# Patient Record
Sex: Female | Born: 1950
Health system: Southern US, Community
[De-identification: ages and names within clinical notes are randomized; demographics above are authoritative.]

## PROBLEM LIST (undated history)

## (undated) DIAGNOSIS — H109 Unspecified conjunctivitis: Secondary | ICD-10-CM

## (undated) DIAGNOSIS — N189 Chronic kidney disease, unspecified: Secondary | ICD-10-CM

## (undated) DIAGNOSIS — K219 Gastro-esophageal reflux disease without esophagitis: Secondary | ICD-10-CM

## (undated) DIAGNOSIS — E039 Hypothyroidism, unspecified: Secondary | ICD-10-CM

## (undated) DIAGNOSIS — H9192 Unspecified hearing loss, left ear: Secondary | ICD-10-CM

## (undated) DIAGNOSIS — T7840XA Allergy, unspecified, initial encounter: Secondary | ICD-10-CM

## (undated) DIAGNOSIS — E785 Hyperlipidemia, unspecified: Secondary | ICD-10-CM

## (undated) DIAGNOSIS — I10 Essential (primary) hypertension: Secondary | ICD-10-CM

## (undated) DIAGNOSIS — Z87442 Personal history of urinary calculi: Secondary | ICD-10-CM

## (undated) DIAGNOSIS — K7581 Nonalcoholic steatohepatitis (NASH): Secondary | ICD-10-CM

## (undated) DIAGNOSIS — K760 Fatty (change of) liver, not elsewhere classified: Secondary | ICD-10-CM

## (undated) DIAGNOSIS — N951 Menopausal and female climacteric states: Secondary | ICD-10-CM

## (undated) DIAGNOSIS — C801 Malignant (primary) neoplasm, unspecified: Secondary | ICD-10-CM

## (undated) HISTORY — DX: Nonalcoholic steatohepatitis (NASH): K75.81

## (undated) HISTORY — DX: Gastro-esophageal reflux disease without esophagitis: K21.9

## (undated) HISTORY — DX: Malignant (primary) neoplasm, unspecified: C80.1

## (undated) HISTORY — DX: Chronic kidney disease, unspecified: N18.9

## (undated) HISTORY — DX: Allergy, unspecified, initial encounter: T78.40XA

## (undated) HISTORY — PX: COLONOSCOPY: SHX174

## (undated) HISTORY — PX: TONSILLECTOMY: SUR1361

## (undated) HISTORY — DX: Menopausal and female climacteric states: N95.1

## (undated) HISTORY — PX: ESOPHAGOGASTRODUODENOSCOPY: SHX1529

## (undated) HISTORY — DX: Unspecified hearing loss, left ear: H91.92

## (undated) HISTORY — PX: ABDOMINAL HYSTERECTOMY: SHX81

## (undated) HISTORY — PX: UPPER GASTROINTESTINAL ENDOSCOPY: SHX188

## (undated) HISTORY — DX: Hypothyroidism, unspecified: E03.9

## (undated) HISTORY — DX: Hyperlipidemia, unspecified: E78.5

## (undated) HISTORY — DX: Essential (primary) hypertension: I10

## (undated) HISTORY — DX: Unspecified conjunctivitis: H10.9

## (undated) HISTORY — PX: CHOLECYSTECTOMY: SHX55

---

## 1998-04-09 ENCOUNTER — Other Ambulatory Visit: Admission: RE | Admit: 1998-04-09 | Discharge: 1998-04-09 | Payer: Self-pay | Admitting: *Deleted

## 1999-04-08 ENCOUNTER — Other Ambulatory Visit: Admission: RE | Admit: 1999-04-08 | Discharge: 1999-04-08 | Payer: Self-pay | Admitting: *Deleted

## 2000-05-04 ENCOUNTER — Other Ambulatory Visit: Admission: RE | Admit: 2000-05-04 | Discharge: 2000-05-04 | Payer: Self-pay | Admitting: *Deleted

## 2004-05-04 ENCOUNTER — Ambulatory Visit: Payer: Self-pay | Admitting: Hematology & Oncology

## 2004-08-13 ENCOUNTER — Ambulatory Visit: Payer: Self-pay | Admitting: Hematology & Oncology

## 2004-08-19 ENCOUNTER — Ambulatory Visit: Payer: Self-pay | Admitting: Family Medicine

## 2004-10-01 ENCOUNTER — Ambulatory Visit: Payer: Self-pay | Admitting: Family Medicine

## 2004-10-13 ENCOUNTER — Other Ambulatory Visit: Admission: RE | Admit: 2004-10-13 | Discharge: 2004-10-13 | Payer: Self-pay | Admitting: Family Medicine

## 2004-10-13 ENCOUNTER — Ambulatory Visit: Payer: Self-pay | Admitting: Family Medicine

## 2004-10-13 LAB — CONVERTED CEMR LAB: Pap Smear: NORMAL

## 2004-11-03 ENCOUNTER — Ambulatory Visit: Payer: Self-pay | Admitting: Family Medicine

## 2004-11-22 ENCOUNTER — Ambulatory Visit: Payer: Self-pay | Admitting: Family Medicine

## 2004-12-24 ENCOUNTER — Encounter (INDEPENDENT_AMBULATORY_CARE_PROVIDER_SITE_OTHER): Payer: Self-pay | Admitting: *Deleted

## 2004-12-24 ENCOUNTER — Ambulatory Visit (HOSPITAL_COMMUNITY): Admission: RE | Admit: 2004-12-24 | Discharge: 2004-12-24 | Payer: Self-pay | Admitting: Surgery

## 2005-03-03 ENCOUNTER — Ambulatory Visit: Payer: Self-pay | Admitting: Family Medicine

## 2005-03-17 ENCOUNTER — Ambulatory Visit: Payer: Self-pay | Admitting: Otolaryngology

## 2005-03-28 ENCOUNTER — Ambulatory Visit: Payer: Self-pay | Admitting: Family Medicine

## 2005-03-31 ENCOUNTER — Ambulatory Visit: Payer: Self-pay | Admitting: Family Medicine

## 2005-10-17 ENCOUNTER — Ambulatory Visit: Payer: Self-pay | Admitting: Family Medicine

## 2005-10-17 LAB — CONVERTED CEMR LAB: TSH: 0.51 microintl units/mL

## 2005-11-21 ENCOUNTER — Ambulatory Visit: Payer: Self-pay | Admitting: Family Medicine

## 2005-12-08 ENCOUNTER — Ambulatory Visit: Payer: Self-pay | Admitting: Family Medicine

## 2006-02-09 ENCOUNTER — Ambulatory Visit: Payer: Self-pay | Admitting: Family Medicine

## 2006-03-06 LAB — HM COLONOSCOPY: HM Colonoscopy: NORMAL

## 2006-04-18 ENCOUNTER — Ambulatory Visit: Payer: Self-pay | Admitting: Family Medicine

## 2006-10-11 ENCOUNTER — Encounter: Payer: Self-pay | Admitting: Family Medicine

## 2006-10-11 DIAGNOSIS — E039 Hypothyroidism, unspecified: Secondary | ICD-10-CM | POA: Insufficient documentation

## 2006-10-11 DIAGNOSIS — I10 Essential (primary) hypertension: Secondary | ICD-10-CM | POA: Insufficient documentation

## 2006-10-11 DIAGNOSIS — H409 Unspecified glaucoma: Secondary | ICD-10-CM | POA: Insufficient documentation

## 2006-10-11 DIAGNOSIS — Z87442 Personal history of urinary calculi: Secondary | ICD-10-CM | POA: Insufficient documentation

## 2006-10-11 DIAGNOSIS — K219 Gastro-esophageal reflux disease without esophagitis: Secondary | ICD-10-CM | POA: Insufficient documentation

## 2006-10-23 ENCOUNTER — Ambulatory Visit: Payer: Self-pay | Admitting: Family Medicine

## 2006-10-23 DIAGNOSIS — N951 Menopausal and female climacteric states: Secondary | ICD-10-CM | POA: Insufficient documentation

## 2006-10-25 ENCOUNTER — Encounter (INDEPENDENT_AMBULATORY_CARE_PROVIDER_SITE_OTHER): Payer: Self-pay | Admitting: *Deleted

## 2006-10-25 LAB — CONVERTED CEMR LAB
AST: 62 units/L — ABNORMAL HIGH (ref 0–37)
Alkaline Phosphatase: 76 units/L (ref 39–117)
BUN: 11 mg/dL (ref 6–23)
Basophils Absolute: 0 10*3/uL (ref 0.0–0.1)
Basophils Relative: 0.1 % (ref 0.0–1.0)
Bilirubin, Direct: 0.2 mg/dL (ref 0.0–0.3)
Calcium: 9.6 mg/dL (ref 8.4–10.5)
Chloride: 103 meq/L (ref 96–112)
Direct LDL: 139.7 mg/dL
Glucose, Bld: 98 mg/dL (ref 70–99)
Hemoglobin: 16.5 g/dL — ABNORMAL HIGH (ref 12.0–15.0)
Lymphocytes Relative: 32.3 % (ref 12.0–46.0)
MCV: 98.9 fL (ref 78.0–100.0)
Monocytes Absolute: 0.5 10*3/uL (ref 0.2–0.7)
Monocytes Relative: 7.4 % (ref 3.0–11.0)
Neutro Abs: 4 10*3/uL (ref 1.4–7.7)
Platelets: 137 10*3/uL — ABNORMAL LOW (ref 150–400)
Potassium: 3.7 meq/L (ref 3.5–5.1)
RBC: 4.79 M/uL (ref 3.87–5.11)
TSH: 0.05 microintl units/mL — ABNORMAL LOW (ref 0.35–5.50)
Total Bilirubin: 1.3 mg/dL — ABNORMAL HIGH (ref 0.3–1.2)
Total CHOL/HDL Ratio: 4.4
Total Protein: 7.1 g/dL (ref 6.0–8.3)
Triglycerides: 134 mg/dL (ref 0–149)

## 2007-01-26 ENCOUNTER — Ambulatory Visit: Payer: Self-pay | Admitting: Family Medicine

## 2007-01-29 LAB — CONVERTED CEMR LAB
AST: 47 units/L — ABNORMAL HIGH (ref 0–37)
Basophils Absolute: 0 10*3/uL (ref 0.0–0.1)
Basophils Relative: 0.3 % (ref 0.0–1.0)
Calcium: 9.5 mg/dL (ref 8.4–10.5)
Cholesterol: 188 mg/dL (ref 0–200)
Creatinine, Ser: 0.8 mg/dL (ref 0.4–1.2)
Eosinophils Absolute: 0.4 10*3/uL (ref 0.0–0.6)
Eosinophils Relative: 5.7 % — ABNORMAL HIGH (ref 0.0–5.0)
GFR calc non Af Amer: 78 mL/min
HCT: 43.6 % (ref 36.0–46.0)
HDL: 39.6 mg/dL (ref >39.0)
Lymphocytes Relative: 37.6 % (ref 12.0–46.0)
MCHC: 35.1 g/dL (ref 30.0–36.0)
MCV: 99.9 fL (ref 78.0–100.0)
Monocytes Absolute: 0.4 10*3/uL (ref 0.2–0.7)
Neutro Abs: 3.3 10*3/uL (ref 1.4–7.7)
Platelets: 157 10*3/uL (ref 150–400)
Potassium: 3.3 meq/L — ABNORMAL LOW (ref 3.5–5.1)
TSH: 0.14 microintl units/mL — ABNORMAL LOW (ref 0.35–5.50)
Total CHOL/HDL Ratio: 4.7

## 2007-03-16 ENCOUNTER — Ambulatory Visit: Payer: Self-pay | Admitting: Family Medicine

## 2007-07-09 ENCOUNTER — Encounter: Payer: Self-pay | Admitting: Family Medicine

## 2007-07-13 ENCOUNTER — Encounter (INDEPENDENT_AMBULATORY_CARE_PROVIDER_SITE_OTHER): Payer: Self-pay | Admitting: *Deleted

## 2007-07-13 ENCOUNTER — Encounter: Payer: Self-pay | Admitting: Family Medicine

## 2007-08-28 ENCOUNTER — Telehealth: Payer: Self-pay | Admitting: Family Medicine

## 2007-10-24 ENCOUNTER — Encounter: Payer: Self-pay | Admitting: Family Medicine

## 2007-10-24 ENCOUNTER — Other Ambulatory Visit: Admission: RE | Admit: 2007-10-24 | Discharge: 2007-10-24 | Payer: Self-pay | Admitting: Family Medicine

## 2007-10-24 ENCOUNTER — Ambulatory Visit: Payer: Self-pay | Admitting: Family Medicine

## 2007-10-25 ENCOUNTER — Encounter (INDEPENDENT_AMBULATORY_CARE_PROVIDER_SITE_OTHER): Payer: Self-pay | Admitting: *Deleted

## 2007-10-25 ENCOUNTER — Telehealth: Payer: Self-pay | Admitting: Family Medicine

## 2007-10-26 ENCOUNTER — Encounter (INDEPENDENT_AMBULATORY_CARE_PROVIDER_SITE_OTHER): Payer: Self-pay | Admitting: *Deleted

## 2007-10-26 LAB — CONVERTED CEMR LAB
ALT: 44 units/L — ABNORMAL HIGH (ref 0–35)
Albumin: 4.2 g/dL (ref 3.5–5.2)
Alkaline Phosphatase: 79 units/L (ref 39–117)
BUN: 9 mg/dL (ref 6–23)
Basophils Absolute: 0 10*3/uL (ref 0.0–0.1)
Basophils Relative: 0.3 % (ref 0.0–1.0)
Bilirubin, Direct: 0.2 mg/dL (ref 0.0–0.3)
Calcium: 9.6 mg/dL (ref 8.4–10.5)
Creatinine, Ser: 0.9 mg/dL (ref 0.4–1.2)
GFR calc non Af Amer: 68 mL/min
Hemoglobin: 15.8 g/dL — ABNORMAL HIGH (ref 12.0–15.0)
LDL Cholesterol: 132 mg/dL — ABNORMAL HIGH (ref 0–99)
Lymphocytes Relative: 34.4 % (ref 12.0–46.0)
MCHC: 35.6 g/dL (ref 30.0–36.0)
MCV: 95.1 fL (ref 78.0–100.0)
Monocytes Absolute: 0.4 10*3/uL (ref 0.1–1.0)
Platelets: 132 10*3/uL — ABNORMAL LOW (ref 150–400)
RDW: 11.9 % (ref 11.5–14.6)
Sodium: 140 meq/L (ref 135–145)
TSH: 0.08 microintl units/mL — ABNORMAL LOW (ref 0.35–5.50)
Total CHOL/HDL Ratio: 4.6
VLDL: 20 mg/dL (ref 0–40)
WBC: 6.3 10*3/uL (ref 4.5–10.5)

## 2007-11-01 ENCOUNTER — Encounter (INDEPENDENT_AMBULATORY_CARE_PROVIDER_SITE_OTHER): Payer: Self-pay | Admitting: *Deleted

## 2007-11-01 LAB — CONVERTED CEMR LAB: Pap Smear: NORMAL

## 2007-12-14 ENCOUNTER — Ambulatory Visit: Payer: Self-pay | Admitting: Family Medicine

## 2007-12-18 ENCOUNTER — Encounter: Payer: Self-pay | Admitting: Family Medicine

## 2007-12-18 LAB — CONVERTED CEMR LAB
Basophils Relative: 0.8 % (ref 0.0–1.0)
Eosinophils Relative: 5.8 % — ABNORMAL HIGH (ref 0.0–5.0)
Free T4: 1.1 ng/dL (ref 0.6–1.6)
MCV: 98.3 fL (ref 78.0–100.0)
Monocytes Relative: 6.4 % (ref 3.0–12.0)
Neutrophils Relative %: 53.1 % (ref 43.0–77.0)
Platelets: 122 10*3/uL — ABNORMAL LOW (ref 150–400)
RBC: 4.57 M/uL (ref 3.87–5.11)

## 2008-03-10 ENCOUNTER — Ambulatory Visit: Payer: Self-pay | Admitting: Family Medicine

## 2008-03-10 ENCOUNTER — Telehealth: Payer: Self-pay | Admitting: Family Medicine

## 2008-03-12 LAB — CONVERTED CEMR LAB
Albumin: 4.1 g/dL (ref 3.5–5.2)
BUN: 9 mg/dL (ref 6–23)
Basophils Absolute: 0 10*3/uL (ref 0.0–0.1)
Calcium: 9.4 mg/dL (ref 8.4–10.5)
Chloride: 104 meq/L (ref 96–112)
Cholesterol: 215 mg/dL (ref 0–200)
Direct LDL: 150.1 mg/dL
Eosinophils Relative: 6 % — ABNORMAL HIGH (ref 0.0–5.0)
Free T4: 0.9 ng/dL (ref 0.6–1.6)
GFR calc Af Amer: 94 mL/min
Glucose, Bld: 93 mg/dL (ref 70–99)
Hemoglobin: 15.4 g/dL — ABNORMAL HIGH (ref 12.0–15.0)
Lymphocytes Relative: 34.5 % (ref 12.0–46.0)
MCHC: 35.7 g/dL (ref 30.0–36.0)
Monocytes Absolute: 0.4 10*3/uL (ref 0.1–1.0)
Monocytes Relative: 6 % (ref 3.0–12.0)
Platelets: 138 10*3/uL — ABNORMAL LOW (ref 150–400)
Potassium: 3.8 meq/L (ref 3.5–5.1)
RDW: 12.1 % (ref 11.5–14.6)
Sodium: 141 meq/L (ref 135–145)
Triglycerides: 127 mg/dL (ref 0–149)
VLDL: 25 mg/dL (ref 0–40)

## 2008-03-18 ENCOUNTER — Ambulatory Visit: Payer: Self-pay | Admitting: Family Medicine

## 2008-03-18 DIAGNOSIS — R740 Nonspecific elevation of levels of transaminase and lactic acid dehydrogenase [LDH]: Secondary | ICD-10-CM

## 2008-03-18 DIAGNOSIS — E785 Hyperlipidemia, unspecified: Secondary | ICD-10-CM | POA: Insufficient documentation

## 2008-03-18 DIAGNOSIS — R7402 Elevation of levels of lactic acid dehydrogenase (LDH): Secondary | ICD-10-CM | POA: Insufficient documentation

## 2008-03-18 DIAGNOSIS — E669 Obesity, unspecified: Secondary | ICD-10-CM | POA: Insufficient documentation

## 2008-03-18 DIAGNOSIS — R7401 Elevation of levels of liver transaminase levels: Secondary | ICD-10-CM | POA: Insufficient documentation

## 2008-04-08 ENCOUNTER — Ambulatory Visit: Payer: Self-pay | Admitting: Family Medicine

## 2008-07-16 ENCOUNTER — Encounter: Payer: Self-pay | Admitting: Family Medicine

## 2008-07-21 ENCOUNTER — Encounter (INDEPENDENT_AMBULATORY_CARE_PROVIDER_SITE_OTHER): Payer: Self-pay | Admitting: *Deleted

## 2008-10-30 ENCOUNTER — Ambulatory Visit: Payer: Self-pay | Admitting: Family Medicine

## 2008-10-30 LAB — CONVERTED CEMR LAB
Cholesterol, target level: 200 mg/dL
LDL Goal: 130 mg/dL

## 2008-10-31 LAB — CONVERTED CEMR LAB
AST: 90 units/L — ABNORMAL HIGH (ref 0–37)
Albumin: 4.2 g/dL (ref 3.5–5.2)
Bilirubin, Direct: 0.3 mg/dL (ref 0.0–0.3)
Cholesterol: 218 mg/dL — ABNORMAL HIGH (ref 0–200)
HDL: 48.9 mg/dL (ref >39.00)
Total Bilirubin: 1.6 mg/dL — ABNORMAL HIGH (ref 0.3–1.2)
VLDL: 17.8 mg/dL (ref 0.0–40.0)

## 2009-03-18 ENCOUNTER — Ambulatory Visit: Payer: Self-pay | Admitting: Family Medicine

## 2009-08-25 ENCOUNTER — Encounter: Payer: Self-pay | Admitting: Family Medicine

## 2009-08-31 ENCOUNTER — Encounter (INDEPENDENT_AMBULATORY_CARE_PROVIDER_SITE_OTHER): Payer: Self-pay | Admitting: *Deleted

## 2009-11-03 ENCOUNTER — Telehealth: Payer: Self-pay | Admitting: Family Medicine

## 2009-11-03 ENCOUNTER — Ambulatory Visit: Payer: Self-pay | Admitting: Family Medicine

## 2009-11-03 LAB — CONVERTED CEMR LAB
ALT: 49 units/L — ABNORMAL HIGH (ref 0–35)
Alkaline Phosphatase: 70 units/L (ref 39–117)
BUN: 10 mg/dL (ref 6–23)
Basophils Absolute: 0 10*3/uL (ref 0.0–0.1)
Bilirubin, Direct: 0.2 mg/dL (ref 0.0–0.3)
CO2: 34 meq/L — ABNORMAL HIGH (ref 19–32)
Calcium: 9.8 mg/dL (ref 8.4–10.5)
Cholesterol: 248 mg/dL — ABNORMAL HIGH (ref 0–200)
Creatinine, Ser: 0.9 mg/dL (ref 0.4–1.2)
Eosinophils Relative: 6.4 % — ABNORMAL HIGH (ref 0.0–5.0)
Hemoglobin: 15.4 g/dL — ABNORMAL HIGH (ref 12.0–15.0)
Lymphocytes Relative: 31.8 % (ref 12.0–46.0)
MCV: 101.7 fL — ABNORMAL HIGH (ref 78.0–100.0)
Monocytes Absolute: 0.4 10*3/uL (ref 0.1–1.0)
Neutrophils Relative %: 55.9 % (ref 43.0–77.0)
Phosphorus: 4 mg/dL (ref 2.3–4.6)
Potassium: 4.4 meq/L (ref 3.5–5.1)
RDW: 13.2 % (ref 11.5–14.6)
Triglycerides: 158 mg/dL — ABNORMAL HIGH (ref 0.0–149.0)
WBC: 6.9 10*3/uL (ref 4.5–10.5)

## 2010-03-31 ENCOUNTER — Ambulatory Visit: Payer: Self-pay | Admitting: Family Medicine

## 2010-07-06 NOTE — Assessment & Plan Note (Signed)
Summary: FLU SHOT/TOWER/CLE  Nurse Visit   Allergies: 1)  ! Codeine 2)  * Ivp Dye  Orders Added: 1)  Admin 1st Vaccine [90471] 2)  Flu Vaccine 53yr + [[44920]        Flu Vaccine Consent Questions     Do you have a history of severe allergic reactions to this vaccine? no    Any prior history of allergic reactions to egg and/or gelatin? no    Do you have a sensitivity to the preservative Thimersol? no    Do you have a past history of Guillan-Barre Syndrome? no    Do you currently have an acute febrile illness? no    Have you ever had a severe reaction to latex? no    Vaccine information given and explained to patient? yes    Are you currently pregnant? no    Lot Number:[id number]   Exp Date:12/04/2010   Site Given  Left Deltoid IM ROzzie HoyleLPN  October 26, 2100710:50 AM

## 2010-07-06 NOTE — Progress Notes (Signed)
Summary: change to maxzide tabs  Phone Note From Pharmacy   Caller: [place] Summary of Call: Selena Johnson is asking if ok to change from dyazide capsules to tabs.  Capsules are on back order.               Marty Heck CMA  Nov 03, 2009 3:14 PM   Follow-up for Phone Call        that is fine if ok with pt  Follow-up by: Allena Earing MD,  Nov 03, 2009 4:23 PM  Additional Follow-up for Phone Call Additional follow up Details #1::        Pharmacy advised. Additional Follow-up by: Christena Deem CMA Deborra Medina),  Nov 03, 2009 5:01 PM

## 2010-07-06 NOTE — Letter (Signed)
Summary: Results Follow up Letter  [place] [facility]  [address] [place], [place] [address]   Phone: [phone]  Fax: [phone]    08/31/2009 MRN: [id number]     Selena Johnson [address] [place], [place]  [address]    Dear Selena Johnson,  The following are the results of your recent test(s):  Test         Result    Pap Smear:        Normal _____  Not Normal _____ Comments: ______________________________________________________ Cholesterol: LDL(Bad cholesterol):         Your goal is less than:         HDL (Good cholesterol):       Your goal is more than: Comments:  ______________________________________________________ Mammogram:        Normal ___x__  Not Normal _____ Comments:Repeat in 1 year  ___________________________________________________________________ Hemoccult:        Normal _____  Not normal _______ Comments:    _____________________________________________________________________ Other Tests:    We routinely do not discuss normal results over the telephone.  If you desire a copy of the results, or you have any questions about this information we can discuss them at your next office visit.   Sincerely,   Loura Pardon MD

## 2010-07-06 NOTE — Assessment & Plan Note (Signed)
Summary: CPX/CLE   Vital Signs:  Patient profile:   60 year old female Height:      65.5 inches Weight:      224.75 pounds BMI:     36.96 Temp:     97.9 degrees F oral Pulse rate:   64 / minute Pulse rhythm:   regular BP sitting:   118 / 70  (left arm) Cuff size:   large  Vitals Entered By: Ozzie Hoyle LPN (Nov 03, 4625 03:50 AM) CC: CPX LMP part hyst 1985   History of Present Illness: here for health mt exam and to rev chronic med problems   is doing just fine - nothing new   wt is up 3 lb   bp 118/70  bmi 36  hypothyroid - a little more hair loss lately - otherwise feels the same   is walking for exercise    hyst partial past nl pap 09  no hx abn pap or ca no symptoms   mam 3/11  self exam - no lumps   nl dexa 03 no family hx of OP  ca and D  TD 08  shingles status-- wants vaccine- did check with her insurance   due for labs   Allergies: 1)  ! Codeine 2)  * Ivp Dye  Past History:  Past Medical History: Last updated: 10/30/2008 GERD Hypertension Hypothyroidism hyperlipidemia  menopausal syndrome  elevated transaminases  chronic conjunctivitis glaucoma   opthy  Past Surgical History: Last updated: 10/11/2006 Cholecystectomy Hysterectomy- partial, ? prolapse Tonsillectomy EGD Colonoscopy- internal hemorrhoids (03/2006)  Family History: Last updated: 10/24/2007 Father: CAD, HTN, DM, ?ca family- lots of HTN sister died of COPD - smoker    Mother:CAD, HTN, thyroid probs, glaucoma  Social History: Last updated: 10/24/2007 Marital Status: Married Children: none Occupation:  Former Smoker quit after college   Risk Factors: Smoking Status: quit (10/11/2006)  Review of Systems General:  Denies fatigue, malaise, and sweats. Eyes:  Denies blurring and eye irritation. CV:  Denies chest pain or discomfort, lightheadness, and palpitations. Resp:  Denies cough, shortness of breath, and wheezing. GI:  Denies abdominal pain,  change in bowel habits, indigestion, nausea, and vomiting. GU:  Denies abnormal vaginal bleeding, discharge, dysuria, and urinary frequency. MS:  Denies joint redness, joint swelling, and cramps. Derm:  Complains of hair loss; denies itching, lesion(s), poor wound healing, and rash. Neuro:  Denies numbness and tingling. Psych:  Denies anxiety and depression. Endo:  Denies cold intolerance and heat intolerance. Heme:  Denies abnormal bruising and bleeding.  Physical Exam  General:  obese and well appearing  Head:  normocephalic, atraumatic, and no abnormalities observed.   Eyes:  vision grossly intact, pupils equal, pupils round, and pupils reactive to light.  no conjunctival pallor, injection or icterus  Mouth:  pharynx pink and moist.   Neck:  supple with full rom and no masses or thyromegally, no JVD or carotid bruit  Chest Wall:  No deformities, masses, or tenderness noted. Breasts:  No mass, nodules, thickening, tenderness, bulging, retraction, inflamation, nipple discharge or skin changes noted.   Lungs:  Normal respiratory effort, chest expands symmetrically. Lungs are clear to auscultation, no crackles or wheezes. Heart:  Normal rate and regular rhythm. S1 and S2 normal without gallop, murmur, click, rub or other extra sounds. Abdomen:  Bowel sounds positive,abdomen soft and non-tender without masses, organomegaly or hernias noted. no renal bruits  Msk:  No deformity or scoliosis noted of thoracic or lumbar spine.  no acute  joint changes  Pulses:  R and L carotid,radial,femoral,dorsalis pedis and posterior tibial pulses are full and equal bilaterally Extremities:  No clubbing, cyanosis, edema, or deformity noted with normal full range of motion of all joints.   Neurologic:  sensation intact to light touch, gait normal, and DTRs symmetrical and normal.   Skin:  Intact without suspicious lesions or rashes Cervical Nodes:  No lymphadenopathy noted Inguinal Nodes:  No significant  adenopathy Psych:  normal affect, talkative and pleasant    Impression & Recommendations:  Problem # 1:  HEALTH MAINTENANCE EXAM (ICD-V70.0) Assessment Comment Only  reviewed health habits including diet, exercise and skin cancer prevention reviewed health maintenance list and family history  labs today wt loss adv  zostavax today  Orders: Prescription Created Electronically [phone])  Problem # 2:  TRANSAMINASES, SERUM, ELEVATED (ICD-790.4) Assessment: Unchanged will continue to watch- suspect fatty liver wt loss adv  Orders: Venipuncture (75916) TLB-Lipid Panel (80061-LIPID) TLB-Renal Function Panel (80069-RENAL) TLB-Hepatic/Liver Function Pnl (80076-HEPATIC) TLB-CBC Platelet - w/Differential (85025-CBCD) TLB-TSH (Thyroid Stimulating Hormone) (84443-TSH) Prescription Created Electronically [phone])  Problem # 3:  HYPERLIPIDEMIA (ICD-272.4) Assessment: Unchanged lab today rev low sat fat diet  Orders: Venipuncture (59935) TLB-Lipid Panel (80061-LIPID) TLB-Renal Function Panel (80069-RENAL) TLB-Hepatic/Liver Function Pnl (80076-HEPATIC) TLB-CBC Platelet - w/Differential (85025-CBCD) TLB-TSH (Thyroid Stimulating Hormone) (70177-LTJ) Prescription Created Electronically [phone])  Labs Reviewed: SGOT: 90 (10/30/2008)   SGPT: 87 (10/30/2008)  Lipid Goals: Chol Goal: 200 (10/30/2008)   HDL Goal: 40 (10/30/2008)   LDL Goal: 130 (10/30/2008)   TG Goal: 150 (10/30/2008)  Prior 10 Yr Risk Heart Disease: Not enough information (10/30/2008)   HDL:48.90 (10/30/2008), 44.2 (03/10/2008)  LDL:DEL (03/10/2008), 132 (10/24/2007)  Chol:218 (10/30/2008), 215 (03/10/2008)  Trig:89.0 (10/30/2008), 127 (03/10/2008)  Problem # 4:  HYPOTHYROIDISM (ICD-244.9) Assessment: Unchanged  check tsh  no clinical change except for inc hair loss update after lab Her updated medication list for this problem includes:    Synthroid 100 Mcg Tabs (Levothyroxine sodium) .Marland Kitchen... Take one by mouth  daily  Orders: Venipuncture (23300) TLB-Lipid Panel (80061-LIPID) TLB-Renal Function Panel (80069-RENAL) TLB-Hepatic/Liver Function Pnl (80076-HEPATIC) TLB-CBC Platelet - w/Differential (85025-CBCD) TLB-TSH (Thyroid Stimulating Hormone) (76226-JFH) Prescription Created Electronically [id number])  Labs Reviewed: TSH: 3.55 (03/10/2008)    Chol: 218 (10/30/2008)   HDL: 48.90 (10/30/2008)   LDL: DEL (03/10/2008)   TG: 89.0 (10/30/2008)  Problem # 5:  HYPERTENSION (ICD-401.9) Assessment: Unchanged  good control with current meds  urged to keep up walking  lab today Her updated medication list for this problem includes:    Dyazide 37.5-25 Mg Caps (Triamterene-hctz) .Marland Kitchen... Take one by mouth daly    Toprol Xl 100 Mg Tb24 (Metoprolol succinate) .Marland Kitchen... Take one by mouth daily  Orders: Venipuncture (56389) TLB-Lipid Panel (80061-LIPID) TLB-Renal Function Panel (80069-RENAL) TLB-Hepatic/Liver Function Pnl (80076-HEPATIC) TLB-CBC Platelet - w/Differential (85025-CBCD) TLB-TSH (Thyroid Stimulating Hormone) (37342-AJG) Prescription Created Electronically [phone])  BP today: 118/70 Prior BP: 126/70 (10/30/2008)  Prior 10 Yr Risk Heart Disease: Not enough information (10/30/2008)  Labs Reviewed: K+: 3.8 (03/10/2008) Creat: : 0.8 (03/10/2008)   Chol: 218 (10/30/2008)   HDL: 48.90 (10/30/2008)   LDL: DEL (03/10/2008)   TG: 89.0 (10/30/2008)  Complete Medication List: 1)  Protonix 40 Mg Tbec (Pantoprazole sodium) .... Take one by mouth daily 2)  Dyazide 37.5-25 Mg Caps (Triamterene-hctz) .... Take one by mouth daly 3)  Toprol Xl 100 Mg Tb24 (Metoprolol succinate) .... Take one by mouth daily 4)  Cosopt Soln (Dorzolamide-timolol soln) .... Use eye drops as directed 5)  Lumigan Soln (Bimatoprost soln) .... Use eye drops as directed 6)  Caltrate Plus D  .... 1 by mouth two times a day 7)  Synthroid 100 Mcg Tabs (Levothyroxine sodium) .... Take one by mouth daily 8)  Biotin 2500 Mg  ....  Daily 9)  Kls Allerclear 10 Mg Tabs (Loratadine) .... One by mouth daily as needed  Other Orders: Zoster (Shingles) Vaccine Live [phone]) Admin 1st Vaccine [phone]) Admin 1st Vaccine [facility]) [phone])  Patient Instructions: 1)  shingles vaccine today  2)  labs today  3)  keep working on healthy diet and exercise  4)  good blood pressure  Prescriptions: SYNTHROID 100 MCG  TABS (LEVOTHYROXINE SODIUM) Take one by mouth daily  #90 x 3   Entered and Authorized by:   Allena Earing MD   Signed by:   Allena Earing MD on 11/03/2009   Method used:   Electronically to        [facility] (retail)       [place], [place]  [address]       Ph: [phone]       Fax: [phone]   RxID:   [id number] TOPROL XL 100 MG  TB24 (METOPROLOL SUCCINATE) take one by mouth daily  #90 x 3   Entered and Authorized by:   Allena Earing MD   Signed by:   Allena Earing MD on 11/03/2009   Method used:   Electronically to        [facility] (retail)       [place], [place], [place]  [address]       Ph: [phone]       Fax: [phone]   RxID:   [id number] DYAZIDE 37.5-25 MG  CAPS (TRIAMTERENE-HCTZ) take one by mouth daly  #90 x 3   Entered and Authorized by:   Allena Earing MD   Signed by:   Allena Earing MD on 11/03/2009   Method used:   Electronically to        [facility] (retail)       [place], [place]  [address]       Ph: [phone]       Fax: [phone]   RxID:   [id number] PROTONIX 40 MG TBEC (PANTOPRAZOLE SODIUM) Take one by mouth daily  #90 x 3   Entered and Authorized by:   Allena Earing MD   Signed by:   Allena Earing MD on 11/03/2009   Method used:   Electronically to        [facility] (retail)       [place], [place], [place]  [address]       Ph: [phone]       Fax: [phone]   RxID:   [id number]   Current Allergies (reviewed today): ! CODEINE * IVP DYE   Preventive Care Screening  Mammogram:    Date:  08/25/2009    Results:  normal     Zostavax # 1  Vaccine Type: Zostavax    Site: left deltoid    Mfr: Merck    Dose: 0.5 ml    Route: [place]    Given by: Ozzie Hoyle LPN    Exp. Date: 11/13/2010    Lot #: [id number]    VIS given: 03/18/05 given Nov 03, 2009.

## 2010-07-09 ENCOUNTER — Telehealth: Payer: Self-pay | Admitting: Family Medicine

## 2010-07-22 NOTE — Progress Notes (Signed)
Summary: out of synthroid x 2 weeks  Phone Note Call from Patient Call back at Home Phone [phone]   Caller: Patient Call For: Allena Earing MD Summary of Call: Patient was seen for cpx in may of 2011 her synthroid dose was changed at that time. She was supposed to come back in after 6 weeks for f/u and recheck labs after taking her new dose. She is calling today for a refill because she says that she hasn't taken it in over 2 weeks. she doesn't want to set up appt becuase she know that the labs are not going to be accurate. She is asking if she can get a refill and come in after taking it for another 6 weeks. Is it okay to fill it? Uses [facility] [address].  Initial call taken by: Lacretia Nicks,  July 09, 2010 1:32 PM  Follow-up for Phone Call        go ahead and fill- px written on EMR for call in schedule lab in 6 weeks tsh and free T4 for 244.9 thanks Follow-up by: Allena Earing MD,  July 09, 2010 1:37 PM  Additional Follow-up for Phone Call Additional follow up Details #1::        Med sent electronically to [facility] as instructed.Left message for patient to call back. Ozzie Hoyle LPN  July 10, 2955 2:58 PM   Patient notified as instructed by telephone. lab appointment scheduled as instructed 08/23/10 at [facility]  July 12, 4732 10:09 AM     Prescriptions: SYNTHROID 125 MCG TABS (LEVOTHYROXINE SODIUM) 1 by mouth once daily  #30 x 3   Entered by:   Ozzie Hoyle LPN   Authorized by:   Allena Earing MD   Signed by:   Ozzie Hoyle LPN on 03/70/9643   Method used:   Electronically to        [facility] (retail)       [address] [place]       [place], [place]  [address]       Ph: [phone]       Fax: [phone]   RxID:   [id number]

## 2010-08-23 ENCOUNTER — Encounter (INDEPENDENT_AMBULATORY_CARE_PROVIDER_SITE_OTHER): Payer: Self-pay | Admitting: *Deleted

## 2010-08-23 ENCOUNTER — Other Ambulatory Visit (INDEPENDENT_AMBULATORY_CARE_PROVIDER_SITE_OTHER): Payer: BC Managed Care – PPO

## 2010-08-23 ENCOUNTER — Other Ambulatory Visit: Payer: Self-pay | Admitting: Family Medicine

## 2010-08-23 DIAGNOSIS — E039 Hypothyroidism, unspecified: Secondary | ICD-10-CM

## 2010-08-23 LAB — T4, FREE: Free T4: 1.03 ng/dL (ref 0.60–1.60)

## 2010-08-30 NOTE — Progress Notes (Signed)
Patient notified as instructed by phone tree

## 2010-09-13 LAB — HM MAMMOGRAPHY: HM Mammogram: NEGATIVE

## 2010-09-17 ENCOUNTER — Telehealth: Payer: Self-pay

## 2010-09-17 NOTE — Telephone Encounter (Signed)
Patient notified as instructed by telephone add'l views of lt breast were negative. Repeat in 1 yr.Noted health maintenance. Sent report for scanning.

## 2010-10-01 ENCOUNTER — Encounter: Payer: Self-pay | Admitting: Family Medicine

## 2010-10-15 ENCOUNTER — Other Ambulatory Visit: Payer: Self-pay

## 2010-10-15 MED ORDER — PANTOPRAZOLE SODIUM 40 MG PO TBEC: 40.0000 mg | DELAYED_RELEASE_TABLET | Freq: Every day | ORAL | Status: DC

## 2010-10-15 MED ORDER — LEVOTHYROXINE SODIUM 125 MCG PO TABS: 125.0000 ug | ORAL_TABLET | Freq: Every day | ORAL | Status: DC

## 2010-10-15 MED ORDER — TRIAMTERENE-HCTZ 37.5-25 MG PO CAPS: 1.0000 | ORAL_CAPSULE | Freq: Every day | ORAL | Status: DC

## 2010-10-15 MED ORDER — METOPROLOL SUCCINATE ER 100 MG PO TB24: 100.0000 mg | ORAL_TABLET | Freq: Every day | ORAL | Status: DC

## 2010-10-15 NOTE — Telephone Encounter (Signed)
Pt already has appt for CPX 11/10/10 with Dr Glori Bickers.

## 2010-10-22 NOTE — Op Note (Signed)
NAMEGENEAN, ADAMSKI                ACCOUNT NO.:  [id number]   MEDICAL RECORD NO.:  [id number]          PATIENT TYPE:  AMB   LOCATION:  DAY                          FACILITY:  [facility]   PHYSICIAN:  Thomas A. Cornett, M.D.DATE OF BIRTH:  [birth year]   DATE OF PROCEDURE:  12/24/2004  DATE OF DISCHARGE:                                 OPERATIVE REPORT   PREOPERATIVE DIAGNOSIS:  Symptomatic cholelithiasis.   POSTOPERATIVE DIAGNOSIS:  Symptomatic cholelithiasis.   PROCEDURE:  Laparoscopic cholecystectomy with intraoperative cholangiogram.   SURGEON:  Thomas A. Cornett, M.D.   ASSISTANT:  Orson Ape. Rise Patience, M.D.   ANESTHESIA:  General endotracheal anesthesia with 10 mL of 0.25% Marcaine  with epinephrine.   ESTIMATED BLOOD LOSS:  50 mL.   SPECIMENS:  Gallbladder to pathology.   DRAINS:  None.   INDICATIONS FOR PROCEDURE:  The patient is a 60 year old female with  symptomatic cholelithiasis. She presents today for laparoscopic  cholecystectomy for treatment of this.   DESCRIPTION OF PROCEDURE:  The patient was brought to the operating room,  placed supine. After induction of general endotracheal anesthesia, the  abdomen was prepped and draped in a sterile fashion. She received  preoperative antibiotics. A 1 cm supraumbilical incision was made,  dissection was carried down to her fascia. The fascia was incised using the  scalpel and both edges were grasped with Kocher's. The peritoneum was opened  with a hemostat. A pursestring of #0 Vicryl was placed around this and a 10  mm Hasson cannula was placed under direct vision. Pneumoperitoneum was then  created to 15 mmHg with CO2. She was placed in reverse Trendelenburg and  rolled to her left. Laparoscopy was performed. There was evidence of  micronodular cirrhosis secondary to fatty liver. There were no other  intraabdominal abnormalities. Next, in the subxiphoid position, a 5 mm  trocar was placed with local anesthesia under  direct vision. Two other 5 mm  ports were placed and were in the right upper quadrant, the second in the  right lower quadrant under direct vision. The gallbladder was grasped by its  dome and retracted toward the patient's right upper quadrant. Due to the  micronodular cirrhosis noted and fatty liver, the gallbladder was somewhat  difficult to manipulate. The infundibulum was grasped and retracted toward  the patient's right lower quadrant. The peritoneum was scored. The common  duct could be directly visualized, it did not appear grossly dilated.  Dissection was then carried out at the junction of the cystic duct and  infundibulum. This was dissected out circumferentially. Next, a clip was  placed on the gallbladder side. Through a separate stab incision, a Reddick  catheter was placed under direct vision. A small incision was made in the  cystic duct and a Reddick catheter was placed and the balloon was inflated.  It flushed easily. Intraoperative cholangiogram using 1/2 strength Omnipaque  was then performed. The patient did receive preoperative Decadron. The  common duct and cystic duct filled quite easily, the bifurcation was well  visualized. There was free flow of contrast down into the duodenum without  signs of obstruction. There is a small filling defect noted but this  appeared to be an air bubble since it was not present in all scenes of the  cholangiogram. The duct measured grossly 5-6 mm it looked like. At this  point in time, the cholangiogram was complete. The catheter was removed and  the duct was double clipped. Cautery was used to dissect out the cystic duct  and a single clip was placed on this and the harmonic scalpel was then used  to coagulate the other end on the gallbladder side of the cystic duct. There  were some small branches where single clips were applied as well. The hook  cutter was then used to dissect the gallbladder from the gallbladder fossa  with good  hemostasis in the bed. Inspection of the clips on the cystic  artery as well as the cystic duct appeared to be intact. Irrigation was used  to suction out any old blood and this appeared to be hemostatic. At this  point in time, a 5 mm scope was used and placed in the subxiphoid port. An  EndoCatch bag was placed at the umbilicus and the gallbladder was extracted  from the abdomen with the EndoCatch bag. The umbilical port was closed with  a pursestring suture that was already present. The laparoscope was then used  to examine the gallbladder which appeared hemostatic. Fluid was suctioned  out, there was no evidence of any bowel or bleeding at this point in time.  The remainder of the fluid was suctioned out. At this point, the port was  withdrawn under direct vision. The pneumoperitoneum was released and the  subxiphoid port was removed. 4-0 Monocryl was then used to close all skin  incisions in a subcuticular fashion. Dry sterile dressings were then  applied. All sponge, needle and instrument counts were counted and found to  be correct at this portion of the case. The patient awoke, taken to recovery  in satisfactory condition.       TAC/MEDQ  D:  12/24/2004  T:  12/24/2004  Job:  [id number]   cc:   [facility]

## 2010-11-01 ENCOUNTER — Encounter: Payer: Self-pay | Admitting: Family Medicine

## 2010-11-04 ENCOUNTER — Telehealth: Payer: Self-pay | Admitting: Family Medicine

## 2010-11-04 DIAGNOSIS — E039 Hypothyroidism, unspecified: Secondary | ICD-10-CM

## 2010-11-04 DIAGNOSIS — E785 Hyperlipidemia, unspecified: Secondary | ICD-10-CM

## 2010-11-04 DIAGNOSIS — Z0000 Encounter for general adult medical examination without abnormal findings: Secondary | ICD-10-CM

## 2010-11-04 NOTE — Telephone Encounter (Signed)
Message copied by Abner Greenspan on Thu Nov 04, 2010  9:04 AM ------      Message from: Marchia Bond      Created: Wed Nov 03, 2010  1:10 PM      Regarding: Cpx labs tomorrow       Please order  future cpx labs for pt's upcomming lab appt.      Thanks      Aniceto Boss

## 2010-11-05 ENCOUNTER — Other Ambulatory Visit (INDEPENDENT_AMBULATORY_CARE_PROVIDER_SITE_OTHER): Payer: BC Managed Care – PPO

## 2010-11-05 DIAGNOSIS — E785 Hyperlipidemia, unspecified: Secondary | ICD-10-CM

## 2010-11-05 DIAGNOSIS — E039 Hypothyroidism, unspecified: Secondary | ICD-10-CM

## 2010-11-05 DIAGNOSIS — Z0000 Encounter for general adult medical examination without abnormal findings: Secondary | ICD-10-CM

## 2010-11-05 LAB — CBC WITH DIFFERENTIAL/PLATELET
Basophils Absolute: 0 10*3/uL (ref 0.0–0.1)
Basophils Relative: 0.3 % (ref 0.0–3.0)
Eosinophils Relative: 3.4 % (ref 0.0–5.0)
HCT: 43.4 % (ref 36.0–46.0)
Lymphocytes Relative: 33.4 % (ref 12.0–46.0)
Neutrophils Relative %: 56.6 % (ref 43.0–77.0)
Platelets: 106 10*3/uL — ABNORMAL LOW (ref 150.0–400.0)
RBC: 4.24 Mil/uL (ref 3.87–5.11)
RDW: 12.1 % (ref 11.5–14.6)

## 2010-11-05 LAB — COMPREHENSIVE METABOLIC PANEL
ALT: 65 U/L — ABNORMAL HIGH (ref 0–35)
AST: 72 U/L — ABNORMAL HIGH (ref 0–37)
Albumin: 4.1 g/dL (ref 3.5–5.2)
CO2: 27 mEq/L (ref 19–32)
Sodium: 141 mEq/L (ref 135–145)
Total Bilirubin: 1.2 mg/dL (ref 0.3–1.2)
Total Protein: 7 g/dL (ref 6.0–8.3)

## 2010-11-05 LAB — LIPID PANEL
LDL Cholesterol: 122 mg/dL — ABNORMAL HIGH (ref 0–99)
Total CHOL/HDL Ratio: 4
VLDL: 19 mg/dL (ref 0.0–40.0)

## 2010-11-10 ENCOUNTER — Encounter: Payer: Self-pay | Admitting: Family Medicine

## 2010-11-10 ENCOUNTER — Ambulatory Visit (INDEPENDENT_AMBULATORY_CARE_PROVIDER_SITE_OTHER): Payer: BC Managed Care – PPO | Admitting: Family Medicine

## 2010-11-10 DIAGNOSIS — D696 Thrombocytopenia, unspecified: Secondary | ICD-10-CM

## 2010-11-10 DIAGNOSIS — E669 Obesity, unspecified: Secondary | ICD-10-CM

## 2010-11-10 DIAGNOSIS — E039 Hypothyroidism, unspecified: Secondary | ICD-10-CM

## 2010-11-10 DIAGNOSIS — R7401 Elevation of levels of liver transaminase levels: Secondary | ICD-10-CM

## 2010-11-10 DIAGNOSIS — E785 Hyperlipidemia, unspecified: Secondary | ICD-10-CM

## 2010-11-10 DIAGNOSIS — I10 Essential (primary) hypertension: Secondary | ICD-10-CM

## 2010-11-10 DIAGNOSIS — Z0000 Encounter for general adult medical examination without abnormal findings: Secondary | ICD-10-CM

## 2010-11-10 MED ORDER — LEVOTHYROXINE SODIUM 150 MCG PO TABS: 150.0000 ug | ORAL_TABLET | Freq: Every day | ORAL | Status: DC

## 2010-11-10 NOTE — Progress Notes (Signed)
Subjective:    Patient ID: Selena Johnson, female    DOB: 03-Oct-1950, 60 y.o.   MRN: [id number]  HPI Here for annual health mt exam and to review chronic med problems  Has been feeling pretty good  Obese- wt down 4 lb - working on that   Taking care of husb with 2 knee repl  stressful   hyst for prolapse in past  No problems /no new partners or abn paps   Flu shot in fall Td08 Zoster 2011 vaccine  Mam 4/12 normal  Self exam- no lumps or changes   colonosc nl 10/07-- 10 year follow up / no fam hx   Lipids Diet is fair- does watch out for fats  Does not tolerate omega 3 - caused joint pain  Lab Results  Component Value Date   CHOL 193 11/05/2010   CHOL 248* 11/03/2009   CHOL 218* 10/30/2008   Lab Results  Component Value Date   HDL 52.50 11/05/2010   HDL 58.20 11/03/2009   HDL 48.90 10/30/2008   Lab Results  Component Value Date   LDLCALC 122* 11/05/2010   LDLCALC 132* 10/24/2007   LDLCALC 124* 01/26/2007   Lab Results  Component Value Date   TRIG 95.0 11/05/2010   TRIG 158.0* 11/03/2009   TRIG 89.0 10/30/2008   Lab Results  Component Value Date   CHOLHDL 4 11/05/2010   CHOLHDL 4 11/03/2009   CHOLHDL 4 10/30/2008   Lab Results  Component Value Date   LDLDIRECT 171.0 11/03/2009   LDLDIRECT 169.3 10/30/2008   LDLDIRECT 150.1 03/10/2008  needs to get walking again    HTN in good control 116/68 on current meds No symptoms or side effects   Hypothyroid  tsh high at 7.09 No missed doses  Clinically- some hair loss   Platelets lower than usual Never been this before  No bleeding or bruising or symptoms at all  ? Brother had low platelets  Lab Results  Component Value Date   WBC 5.8 11/05/2010   HGB 15.0 11/05/2010   HCT 43.4 11/05/2010   MCV 102.3* 11/05/2010   PLT 106.0 Repeated and verified X2.* 11/05/2010   she saw hematologist in the past - Dr Jonette Eva    Hx of inc LFT These areup this time  ast 4 and alt 65  Patient Active Problem List  Diagnoses  .  HYPOTHYROIDISM  . HYPERLIPIDEMIA  . OBESITY, UNSPECIFIED  . GLAUCOMA  . HYPERTENSION  . GERD  . RENAL CALCULUS  . MENOPAUSE-RELATED VASOMOTOR SYMPTOMS, HOT FLASHES  . TRANSAMINASES, SERUM, ELEVATED  . Routine general medical examination at a health care facility  . Thrombocytopenia   Past Medical History  Diagnosis Date  . GERD (gastroesophageal reflux disease)   . Hypertension   . Hyperlipidemia   . Hyperthyroidism   . Menopausal syndrome   . Conjunctivitis     chronic  . Glaucoma    Past Surgical History  Procedure Date  . Cholecystectomy   . Abdominal hysterectomy     partial ? prolapse  . Tonsillectomy   . Esophagogastroduodenoscopy    History  Substance Use Topics  . Smoking status: Former Research scientist (life sciences)  . Smokeless tobacco: Not on file  . Alcohol Use:    Family History  Problem Relation Age of Onset  . Heart disease Mother     CAD  . Hypertension Mother   . Glaucoma Mother   . Heart disease Father     CAD  . Diabetes Father   .  Hypertension Father   . Cancer Father     ? CA   Allergies  Allergen Reactions  . Codeine     REACTION: Throat swelling  . Ivp Dye (Iodinated Diagnostic Agents) Swelling   Current Outpatient Prescriptions on File Prior to Visit  Medication Sig Dispense Refill  . Calcium Carbonate-Vitamin D 600-400 MG-UNIT per tablet Take 1 tablet by mouth daily.       . metoprolol (TOPROL XL) 100 MG 24 hr tablet Take 1 tablet (100 mg total) by mouth daily.  90 tablet  0  . pantoprazole (PROTONIX) 40 MG tablet Take 1 tablet (40 mg total) by mouth daily.  90 tablet  0  . triamterene-hydrochlorothiazide (DYAZIDE) 37.5-25 MG per capsule Take 1 capsule by mouth daily.  90 capsule  0  . Biotin 2500 MCG CAPS Take 1 capsule by mouth daily.        Marland Kitchen loratadine (CLARITIN) 10 MG tablet Take 10 mg by mouth daily as needed.             Review of Systems Review of Systems  Constitutional: Negative for fever, appetite change, fatigue and unexpected weight  change.  Eyes: Negative for pain and visual disturbance.  Respiratory: Negative for cough and shortness of breath.   Cardiovascular: Negative.  for cp or palp or sob Gastrointestinal: Negative for nausea, diarrhea and constipation.  Genitourinary: Negative for urgency and frequency.  Skin: Negative for pallor. neg for bruising or rash Neurological: Negative for weakness, light-headedness, numbness and headaches.  Hematological: Negative for adenopathy. Does not bruise/bleed easily.  Psychiatric/Behavioral: Negative for dysphoric mood. The patient is not nervous/anxious.  pos for stress        Objective:   Physical Exam  Constitutional: She appears well-developed and well-nourished. No distress.       Obese and well appearing   HENT:  Head: Normocephalic and atraumatic.  Right Ear: External ear normal.  Left Ear: External ear normal.  Nose: Nose normal.  Mouth/Throat: Oropharynx is clear and moist.  Eyes: Conjunctivae and EOM are normal. Pupils are equal, round, and reactive to light.  Neck: Normal range of motion. Neck supple. No JVD present. No thyromegaly present.  Cardiovascular: Normal rate, regular rhythm, normal heart sounds and intact distal pulses.   Pulmonary/Chest: Effort normal and breath sounds normal. No respiratory distress. She has no wheezes. She has no rales.  Abdominal: Soft. Bowel sounds are normal. She exhibits no distension and no mass. There is no tenderness.  Genitourinary: No breast swelling, tenderness, discharge or bleeding.       No breast lumps  Musculoskeletal: Normal range of motion. She exhibits no edema and no tenderness.  Lymphadenopathy:    She has no cervical adenopathy.  Neurological: She is alert. She has normal reflexes. Coordination normal.  Skin: Skin is warm and dry. No rash noted. No erythema. No pallor.       No bruising or petichae  Psychiatric: She has a normal mood and affect.          Assessment & Plan:

## 2010-11-10 NOTE — Patient Instructions (Signed)
Change synthroid to 150 mcg daily Exercise - aim for 5 d per week 30 minutes - indoors or out Hold your aspirin Consider joining weight watchers on line  We will do hematology referral at check out Plan non fasting labs in 6 weeks for tsh and also liver tests

## 2010-11-14 NOTE — Assessment & Plan Note (Signed)
tsh low with no missed doses  Will inc to 150 mcg Update if problems Lab in 6 wk

## 2010-11-14 NOTE — Assessment & Plan Note (Signed)
Rev the risks of obesity and reasons for wt loss  Disc plan for lower calorie diet and regular exercise

## 2010-11-14 NOTE — Assessment & Plan Note (Signed)
Stable to improved Disc/ rev labs in detail  Rev low sat fat diet

## 2010-11-14 NOTE — Assessment & Plan Note (Signed)
Good control with current med Rev labs No changes

## 2010-11-14 NOTE — Assessment & Plan Note (Signed)
Reviewed health habits including diet and exercise and skin cancer prevention Also reviewed health mt list, fam hx and immunizations   Rev wellness labs in detail Disc strategies for wt loss

## 2010-11-14 NOTE — Assessment & Plan Note (Signed)
Mild but needs to be followed  Disc poss of fatty liver  Plan for wt loss with diet and exercise

## 2010-11-14 NOTE — Assessment & Plan Note (Signed)
New  No symptoms  Ref to heme

## 2011-01-07 ENCOUNTER — Telehealth: Payer: Self-pay | Admitting: Family Medicine

## 2011-01-07 ENCOUNTER — Encounter: Payer: Self-pay | Admitting: Family Medicine

## 2011-01-07 NOTE — Telephone Encounter (Signed)
Spoke with Rosaria Ferries and she said either she or Caren Griffins would send letter to patient.

## 2011-01-07 NOTE — Telephone Encounter (Signed)
Please send a letter - if you cannot reach her  If she does not want to go to heme- I at least need to re check her cbc/ platelets Thanks

## 2011-01-07 NOTE — Telephone Encounter (Signed)
Patient requested that we not do the referral back to Dr Marin Olp until middle of July as she had a lot going on with her husband after his surgery. Have been calling the patient daily to get back in contact about her referral back to Dr Marin Olp and noone picks up the call at all. Have left several messages also. Dont want to start the process of the referral if we cant get a hold of her. Please advise

## 2011-01-09 ENCOUNTER — Other Ambulatory Visit: Payer: Self-pay | Admitting: Family Medicine

## 2011-01-11 NOTE — Telephone Encounter (Signed)
[facility] [address] electronically request refills on Pantoprazole 37m, Triamt/HCTZ 37.5/25 and Toprol XL 100 mg #90 x 1 refill on each.

## 2011-01-13 ENCOUNTER — Telehealth: Payer: Self-pay | Admitting: Family Medicine

## 2011-01-13 NOTE — Telephone Encounter (Signed)
Called pt again today, not able to leave a message. A letter was sent to Mrs Cara also. No response as of yet...cdavis

## 2011-01-13 NOTE — Telephone Encounter (Signed)
Thanks for doing that

## 2011-02-09 ENCOUNTER — Encounter: Payer: Self-pay | Admitting: Family Medicine

## 2011-02-11 ENCOUNTER — Telehealth: Payer: Self-pay | Admitting: Family Medicine

## 2011-02-14 ENCOUNTER — Telehealth: Payer: Self-pay | Admitting: Family Medicine

## 2011-02-14 NOTE — Telephone Encounter (Signed)
Certified Letter [employer]

## 2011-03-10 ENCOUNTER — Encounter: Payer: Self-pay | Admitting: Family Medicine

## 2011-03-18 ENCOUNTER — Telehealth: Payer: Self-pay | Admitting: Family Medicine

## 2011-03-22 ENCOUNTER — Telehealth: Payer: Self-pay | Admitting: Family Medicine

## 2011-04-07 ENCOUNTER — Telehealth: Payer: Self-pay | Admitting: Family Medicine

## 2011-04-07 NOTE — Telephone Encounter (Signed)
Certified letter returned undeliverable / unclaimed. Resent by 1st class mail on 03/11/11.

## 2011-05-12 ENCOUNTER — Telehealth: Payer: Self-pay | Admitting: Family Medicine

## 2011-07-08 ENCOUNTER — Other Ambulatory Visit: Payer: Self-pay | Admitting: Family Medicine

## 2011-09-15 ENCOUNTER — Encounter: Payer: Self-pay | Admitting: Family Medicine

## 2011-09-15 ENCOUNTER — Encounter: Payer: Self-pay | Admitting: *Deleted

## 2011-09-23 NOTE — Telephone Encounter (Signed)
Error

## 2011-09-23 NOTE — Telephone Encounter (Signed)
error 

## 2011-09-23 NOTE — Telephone Encounter (Deleted)
Error

## 2011-10-28 ENCOUNTER — Other Ambulatory Visit: Payer: Self-pay | Admitting: Family Medicine

## 2012-02-24 ENCOUNTER — Other Ambulatory Visit: Payer: Self-pay | Admitting: Family Medicine

## 2012-02-24 NOTE — Telephone Encounter (Signed)
Can have refils of the year on that-thanks

## 2012-02-24 NOTE — Telephone Encounter (Signed)
That is fine, go ahead and refil, thanks

## 2012-02-24 NOTE — Telephone Encounter (Signed)
Ok to refill, last app. 11/10/10, and no recent labs but, CPE and labs are schedule for Feb 2014

## 2012-03-30 ENCOUNTER — Other Ambulatory Visit: Payer: Self-pay | Admitting: Family Medicine

## 2012-07-05 ENCOUNTER — Telehealth: Payer: Self-pay | Admitting: Family Medicine

## 2012-07-05 DIAGNOSIS — Z0000 Encounter for general adult medical examination without abnormal findings: Secondary | ICD-10-CM

## 2012-07-05 DIAGNOSIS — E785 Hyperlipidemia, unspecified: Secondary | ICD-10-CM

## 2012-07-05 NOTE — Telephone Encounter (Signed)
Message copied by Abner Greenspan on Thu Jul 05, 2012  5:06 PM ------      Message from: Ellamae Sia      Created: Wed Jun 27, 2012  3:47 PM      Regarding: lab orders for Friday, 1.31.14       Patient is scheduled for CPX labs, please order future labs, Thanks , Karna Christmas

## 2012-07-06 ENCOUNTER — Other Ambulatory Visit (INDEPENDENT_AMBULATORY_CARE_PROVIDER_SITE_OTHER): Payer: BC Managed Care – PPO

## 2012-07-06 DIAGNOSIS — E785 Hyperlipidemia, unspecified: Secondary | ICD-10-CM

## 2012-07-06 DIAGNOSIS — Z0000 Encounter for general adult medical examination without abnormal findings: Secondary | ICD-10-CM

## 2012-07-06 LAB — CBC WITH DIFFERENTIAL/PLATELET
Eosinophils Absolute: 0.2 10*3/uL (ref 0.0–0.7)
Lymphocytes Relative: 39.3 % (ref 12.0–46.0)
MCHC: 35 g/dL (ref 30.0–36.0)
MCV: 98.9 fl (ref 78.0–100.0)
Monocytes Absolute: 0.3 10*3/uL (ref 0.1–1.0)
Neutrophils Relative %: 51 % (ref 43.0–77.0)
Platelets: 97 10*3/uL — ABNORMAL LOW (ref 150.0–400.0)
WBC: 5.6 10*3/uL (ref 4.5–10.5)

## 2012-07-06 LAB — LDL CHOLESTEROL, DIRECT: Direct LDL: 139.8 mg/dL

## 2012-07-06 LAB — COMPREHENSIVE METABOLIC PANEL
ALT: 48 U/L — ABNORMAL HIGH (ref 0–35)
AST: 60 U/L — ABNORMAL HIGH (ref 0–37)
Albumin: 4.1 g/dL (ref 3.5–5.2)
Alkaline Phosphatase: 75 U/L (ref 39–117)
Potassium: 3.3 mEq/L — ABNORMAL LOW (ref 3.5–5.1)
Sodium: 139 mEq/L (ref 135–145)
Total Protein: 7.2 g/dL (ref 6.0–8.3)

## 2012-07-06 LAB — TSH: TSH: 5.03 u[IU]/mL (ref 0.35–5.50)

## 2012-07-06 LAB — LIPID PANEL: Total CHOL/HDL Ratio: 4

## 2012-07-13 ENCOUNTER — Encounter: Payer: Self-pay | Admitting: Family Medicine

## 2012-07-13 ENCOUNTER — Ambulatory Visit (INDEPENDENT_AMBULATORY_CARE_PROVIDER_SITE_OTHER): Payer: BC Managed Care – PPO | Admitting: Family Medicine

## 2012-07-13 ENCOUNTER — Other Ambulatory Visit: Payer: BC Managed Care – PPO

## 2012-07-13 VITALS — BP 132/78 | HR 74 | Temp 98.5°F | Ht 65.5 in | Wt 231.2 lb

## 2012-07-13 DIAGNOSIS — E785 Hyperlipidemia, unspecified: Secondary | ICD-10-CM

## 2012-07-13 DIAGNOSIS — E876 Hypokalemia: Secondary | ICD-10-CM | POA: Insufficient documentation

## 2012-07-13 DIAGNOSIS — R7401 Elevation of levels of liver transaminase levels: Secondary | ICD-10-CM

## 2012-07-13 DIAGNOSIS — E039 Hypothyroidism, unspecified: Secondary | ICD-10-CM

## 2012-07-13 DIAGNOSIS — Z0000 Encounter for general adult medical examination without abnormal findings: Secondary | ICD-10-CM

## 2012-07-13 DIAGNOSIS — E669 Obesity, unspecified: Secondary | ICD-10-CM

## 2012-07-13 DIAGNOSIS — I10 Essential (primary) hypertension: Secondary | ICD-10-CM

## 2012-07-13 MED ORDER — METOPROLOL SUCCINATE ER 100 MG PO TB24: 100.0000 mg | ORAL_TABLET | Freq: Every day | ORAL | Status: DC

## 2012-07-13 MED ORDER — LEVOTHYROXINE SODIUM 150 MCG PO TABS: 150.0000 ug | ORAL_TABLET | Freq: Every day | ORAL | Status: DC

## 2012-07-13 MED ORDER — POTASSIUM CHLORIDE CRYS ER 20 MEQ PO TBCR: 20.0000 meq | EXTENDED_RELEASE_TABLET | Freq: Every day | ORAL | Status: DC

## 2012-07-13 MED ORDER — PANTOPRAZOLE SODIUM 40 MG PO TBEC: 40.0000 mg | DELAYED_RELEASE_TABLET | Freq: Every day | ORAL | Status: DC

## 2012-07-13 MED ORDER — TRIAMTERENE-HCTZ 37.5-25 MG PO TABS: 1.0000 | ORAL_TABLET | Freq: Every day | ORAL | Status: DC

## 2012-07-13 NOTE — Assessment & Plan Note (Signed)
bp in fair control at this time  No changes needed  Disc lifstyle change with low sodium diet and exercise  Will suppl K

## 2012-07-13 NOTE — Assessment & Plan Note (Signed)
Reviewed health habits including diet and exercise and skin cancer prevention Also reviewed health mt list, fam hx and immunizations  Rev wellness labs in detail

## 2012-07-13 NOTE — Patient Instructions (Addendum)
Don't forget to make your own mammogram appt for spring Start potassium supplement daily  Schedule non fasting lab in 2 weeks for potassium level Follow up in 6 months with labs prior Avoid red meat/ fried foods/ egg yolks/ fatty breakfast meats/ butter, cheese and high fat dairy/ and shellfish   Start exercising - inside and out

## 2012-07-13 NOTE — Assessment & Plan Note (Signed)
Down a bit  Suspect fatty liver Disc imp of wt loss in detail

## 2012-07-13 NOTE — Progress Notes (Signed)
Subjective:    Patient ID: Selena Johnson, female    DOB: [birth year], 62 y.o.   MRN: [id number]  HPI Here for health maintenance exam and to review chronic medical problems    Is doing well   Has had a hysterectomy in past  Not getting pap smears No gyn problems at all   Flu vaccine- oct 15th  Mammogram 4/13- she goes to [facility]  Self exam - no lumps or changes   colonosc 10/07-10 year recall   Wt is up 11 lb- has not walking  bmi 37 obese Eating was terrible over the holidays - is back on track now    Ast/alt are still elevated but down a bit  Lab Results  Component Value Date   ALT 48* 07/06/2012   AST 60* 07/06/2012   ALKPHOS 75 07/06/2012   BILITOT 1.2 07/06/2012   K is 3.3 Will need a supplement    bp is stable today  No cp or palpitations or headaches or edema  No side effects to medicines  BP Readings from Last 3 Encounters:  07/13/12 132/78  11/10/10 116/68  11/03/09 118/70      Hypothyroidism  Pt has no clinical changes No change in energy level/ hair or skin/ edema and no tremor Lab Results  Component Value Date   TSH 5.03 07/06/2012     Hyperlipidemia  Diet controlled Lab Results  Component Value Date   CHOL 213* 07/06/2012   CHOL 193 11/05/2010   CHOL 248* 11/03/2009   Lab Results  Component Value Date   HDL 47.90 07/06/2012   HDL 52.50 11/05/2010   HDL 58.20 11/03/2009   Lab Results  Component Value Date   LDLCALC 122* 11/05/2010   LDLCALC 132* 10/24/2007   LDLCALC 124* 01/26/2007   Lab Results  Component Value Date   TRIG 133.0 07/06/2012   TRIG 95.0 11/05/2010   TRIG 158.0* 11/03/2009   Lab Results  Component Value Date   CHOLHDL 4 07/06/2012   CHOLHDL 4 11/05/2010   CHOLHDL 4 11/03/2009   Lab Results  Component Value Date   LDLDIRECT 139.8 07/06/2012   LDLDIRECT 171.0 11/03/2009   LDLDIRECT 169.3 10/30/2008   diet is not optimal    Patient Active Problem List  Diagnosis  . HYPOTHYROIDISM  . HYPERLIPIDEMIA  . OBESITY, UNSPECIFIED  .  GLAUCOMA  . HYPERTENSION  . GERD  . RENAL CALCULUS  . MENOPAUSE-RELATED VASOMOTOR SYMPTOMS, HOT FLASHES  . TRANSAMINASES, SERUM, ELEVATED  . Routine general medical examination at a health care facility  . Thrombocytopenia  . Hypokalemia   Past Medical History  Diagnosis Date  . GERD (gastroesophageal reflux disease)   . Hypertension   . Hyperlipidemia   . Hyperthyroidism   . Menopausal syndrome   . Conjunctivitis     chronic  . Glaucoma(365)    Past Surgical History  Procedure Laterality Date  . Cholecystectomy    . Abdominal hysterectomy      partial ? prolapse  . Tonsillectomy    . Esophagogastroduodenoscopy     History  Substance Use Topics  . Smoking status: Former Research scientist (life sciences)  . Smokeless tobacco: Not on file  . Alcohol Use: Yes     Comment: occ   Family History  Problem Relation Age of Onset  . Heart disease Mother     CAD  . Hypertension Mother   . Glaucoma Mother   . Heart disease Father     CAD  . Diabetes Father   .  Hypertension Father   . Cancer Father     ? CA   Allergies  Allergen Reactions  . Codeine     REACTION: Throat swelling  . Ivp Dye (Iodinated Diagnostic Agents) Swelling   Current Outpatient Prescriptions on File Prior to Visit  Medication Sig Dispense Refill  . Biotin 2500 MCG CAPS Take 1 capsule by mouth daily.        . Calcium Carbonate-Vitamin D 600-400 MG-UNIT per tablet Take 1 tablet by mouth daily.       Marland Kitchen latanoprost (XALATAN) 0.005 % ophthalmic solution Place 1 drop into both eyes at bedtime.        Marland Kitchen loratadine (CLARITIN) 10 MG tablet Take 10 mg by mouth daily as needed.        . Multiple Vitamin (MULTIVITAMIN) capsule Take 1 capsule by mouth daily.        Marland Kitchen triamterene-hydrochlorothiazide (DYAZIDE) 37.5-25 MG per capsule Take 1 capsule by mouth daily.  90 capsule  0   No current facility-administered medications on file prior to visit.     Review of Systems    Review of Systems  Constitutional: Negative for fever,  appetite change, fatigue and unexpected weight change.  Eyes: Negative for pain and visual disturbance.  Respiratory: Negative for cough and shortness of breath.   Cardiovascular: Negative for cp or palpitations    Gastrointestinal: Negative for nausea, diarrhea and constipation.  Genitourinary: Negative for urgency and frequency.  Skin: Negative for pallor or rash   Neurological: Negative for weakness, light-headedness, numbness and headaches.  Hematological: Negative for adenopathy. Does not bruise/bleed easily.  Psychiatric/Behavioral: Negative for dysphoric mood. The patient is not nervous/anxious.      Objective:   Physical Exam  Constitutional: She appears well-developed and well-nourished. No distress.  obese and well appearing   HENT:  Head: Normocephalic and atraumatic.  Right Ear: External ear normal.  Left Ear: External ear normal.  Nose: Nose normal.  Mouth/Throat: Oropharynx is clear and moist. No oropharyngeal exudate.  Eyes: Conjunctivae and EOM are normal. Pupils are equal, round, and reactive to light. Right eye exhibits no discharge. Left eye exhibits no discharge. No scleral icterus.  Neck: Normal range of motion. Neck supple. No JVD present. Carotid bruit is not present. No thyromegaly present.  Cardiovascular: Normal rate, regular rhythm, normal heart sounds and intact distal pulses.  Exam reveals no gallop.   Pulmonary/Chest: Effort normal and breath sounds normal. No respiratory distress. She has no wheezes.  Abdominal: Soft. Bowel sounds are normal. She exhibits no distension, no abdominal bruit and no mass. There is no tenderness.  Genitourinary: No breast swelling, tenderness or discharge.  Breast exam: No mass, nodules, thickening, tenderness, bulging, retraction, inflamation, nipple discharge or skin changes noted.  No axillary or clavicular LA.  Chaperoned exam.    Musculoskeletal: She exhibits no edema and no tenderness.  Lymphadenopathy:    She has no  cervical adenopathy.  Neurological: She is alert. She has normal reflexes. No cranial nerve deficit. She exhibits normal muscle tone. Coordination normal.  Skin: Skin is warm and dry. No rash noted. No erythema. No pallor.  Psychiatric: She has a normal mood and affect.          Assessment & Plan:

## 2012-07-13 NOTE — Assessment & Plan Note (Signed)
Hypothyroidism  Pt has no clinical changes No change in energy level/ hair or skin/ edema and no tremor Lab Results  Component Value Date   TSH 5.03 07/06/2012

## 2012-07-13 NOTE — Assessment & Plan Note (Signed)
Disc goals for lipids and reasons to control them Rev labs with pt Rev low sat fat diet in detail Will re check 6 mo and f/u-may need statin if not imp

## 2012-07-13 NOTE — Assessment & Plan Note (Signed)
Start 20 meq K cl daily Re check 2 wk

## 2012-07-13 NOTE — Assessment & Plan Note (Signed)
Discussed how this problem influences overall health and the risks it imposes  Reviewed plan for weight loss with lower calorie diet (via better food choices and also portion control or program like weight watchers) and exercise building up to or more than 30 minutes 5 days per week including some aerobic activity    

## 2012-07-20 ENCOUNTER — Encounter: Payer: BC Managed Care – PPO | Admitting: Family Medicine

## 2012-07-24 ENCOUNTER — Encounter: Payer: BC Managed Care – PPO | Admitting: Family Medicine

## 2012-08-03 ENCOUNTER — Other Ambulatory Visit (INDEPENDENT_AMBULATORY_CARE_PROVIDER_SITE_OTHER): Payer: BC Managed Care – PPO

## 2012-08-03 DIAGNOSIS — E876 Hypokalemia: Secondary | ICD-10-CM

## 2012-08-06 ENCOUNTER — Encounter: Payer: Self-pay | Admitting: *Deleted

## 2012-09-17 ENCOUNTER — Encounter: Payer: Self-pay | Admitting: Family Medicine

## 2012-09-18 ENCOUNTER — Encounter: Payer: Self-pay | Admitting: Family Medicine

## 2012-10-09 ENCOUNTER — Telehealth: Payer: Self-pay

## 2012-10-09 NOTE — Telephone Encounter (Signed)
Pt left message requesting brand change of thyroid med to [facility] [address]. Spoke with Joelene Millin at [employer] wants to change [employer] brand of levothyroxine to [employer] brand.Please advise. Left v/m for pt to call back to let pt know request change sent to Dr Glori Bickers.

## 2012-10-09 NOTE — Telephone Encounter (Signed)
Called pharmacy and the old brand is discontinued and pt wants to stay @ [facility] so gave them verbal permission to change it (absolutely necessary)

## 2012-10-09 NOTE — Telephone Encounter (Signed)
Since her last tsh was theraputic and she is clincally stable I would rather not change brands unless absolutely necessary or her insurance will not pay for it thanks

## 2012-10-16 ENCOUNTER — Telehealth: Payer: Self-pay

## 2012-10-16 NOTE — Telephone Encounter (Signed)
Pt said lower abd cramping then radiates up to upper abdomen and lower back pain on both sides but worse on rt side; pt has a lot of gas when this happens has been going on for 2 months on and off; last episode 1 wk ago. No nausea or vomiting, no diarrhea or constipation, no fever but has intermittent chills and no UTI symptoms. Pt wonders if could be related to taking Potassium or diverticulosis or her diet. Pt scheduled appt 10/17/12 at 10:15 to see Dr Glori Bickers; if pt condition changes or worsens pt will call back or go to UC.(not having pain now).

## 2012-10-16 NOTE — Telephone Encounter (Signed)
I will see her then

## 2012-10-17 ENCOUNTER — Encounter: Payer: Self-pay | Admitting: Family Medicine

## 2012-10-17 ENCOUNTER — Ambulatory Visit (INDEPENDENT_AMBULATORY_CARE_PROVIDER_SITE_OTHER): Payer: BC Managed Care – PPO | Admitting: Family Medicine

## 2012-10-17 VITALS — BP 140/84 | HR 83 | Temp 97.9°F | Ht 65.5 in | Wt 233.0 lb

## 2012-10-17 DIAGNOSIS — M545 Low back pain, unspecified: Secondary | ICD-10-CM

## 2012-10-17 DIAGNOSIS — D696 Thrombocytopenia, unspecified: Secondary | ICD-10-CM

## 2012-10-17 DIAGNOSIS — R109 Unspecified abdominal pain: Secondary | ICD-10-CM | POA: Insufficient documentation

## 2012-10-17 LAB — CBC WITH DIFFERENTIAL/PLATELET
Basophils Relative: 0.1 % (ref 0.0–3.0)
Eosinophils Absolute: 0.2 10*3/uL (ref 0.0–0.7)
Lymphocytes Relative: 24.5 % (ref 12.0–46.0)
MCHC: 35.3 g/dL (ref 30.0–36.0)
MCV: 100.9 fl — ABNORMAL HIGH (ref 78.0–100.0)
Monocytes Absolute: 0.7 10*3/uL (ref 0.1–1.0)
Neutrophils Relative %: 64.4 % (ref 43.0–77.0)
Platelets: 87 10*3/uL — ABNORMAL LOW (ref 150.0–400.0)
RBC: 4.33 Mil/uL (ref 3.87–5.11)
WBC: 8.7 10*3/uL (ref 4.5–10.5)

## 2012-10-17 LAB — POCT URINALYSIS DIPSTICK
Nitrite, UA: NEGATIVE
Urobilinogen, UA: 0.2

## 2012-10-17 LAB — BASIC METABOLIC PANEL
BUN: 8 mg/dL (ref 6–23)
CO2: 27 mEq/L (ref 19–32)
Chloride: 102 mEq/L (ref 96–112)
Creatinine, Ser: 0.8 mg/dL (ref 0.4–1.2)
Glucose, Bld: 103 mg/dL — ABNORMAL HIGH (ref 70–99)
Potassium: 3.1 mEq/L — ABNORMAL LOW (ref 3.5–5.1)

## 2012-10-17 LAB — HEPATIC FUNCTION PANEL
Alkaline Phosphatase: 74 U/L (ref 39–117)
Bilirubin, Direct: 0.4 mg/dL — ABNORMAL HIGH (ref 0.0–0.3)
Total Bilirubin: 2.1 mg/dL — ABNORMAL HIGH (ref 0.3–1.2)

## 2012-10-17 MED ORDER — ALPRAZOLAM 0.5 MG PO TABS: ORAL_TABLET | ORAL | Status: DC

## 2012-10-17 NOTE — Patient Instructions (Addendum)
Labs today for abdominal pain  See Rosaria Ferries to set up cat scan at check out  If needed - take a xanax 1 hour before your cat scan  If symptoms worsen please let me know

## 2012-10-17 NOTE — Progress Notes (Signed)
Subjective:    Patient ID: Selena Johnson, female    DOB: 06-05-1951, 62 y.o.   MRN: [id number]  HPI Here with abdominal cramping and back pain  Whole abdomen - day or night , intermittent in different spots (can even wake her up at night)  Pain radiates to her low back and also her "sides" "feels like her intestines are twisted" Pain varies-some days worse than others  4-5 day cycle- starts at night - gets progressively worse and then backs off   She has always had "stomach/ gas/constipation" problems This new issue has gone on 3-4 mon colonosc 07 was normal (per pt was difficult because of very curvy colon)  Nothing makes it better She does not eat when she has this happened  Gets relief from passing gas or burping temporarily She also gets GERD at times   Has not linked to specific foods - has tried to keep a food diary    Chemistry      Component Value Date/Time   NA 139 07/06/2012 0845   K 3.9 08/03/2012 1031   CL 103 07/06/2012 0845   CO2 28 07/06/2012 0845   BUN 10 07/06/2012 0845   CREATININE 0.8 07/06/2012 0845      Component Value Date/Time   CALCIUM 9.3 07/06/2012 0845   ALKPHOS 75 07/06/2012 0845   AST 60* 07/06/2012 0845   ALT 48* 07/06/2012 0845   BILITOT 1.2 07/06/2012 0845       Thought the K may have caused it - skipped it yesterday -no difference    Patient Active Problem List   Diagnosis Date Noted  . Hypokalemia 07/13/2012  . Thrombocytopenia 11/10/2010  . Routine general medical examination at a health care facility 11/04/2010  . HYPERLIPIDEMIA 03/18/2008  . OBESITY, UNSPECIFIED 03/18/2008  . TRANSAMINASES, SERUM, ELEVATED 03/18/2008  . MENOPAUSE-RELATED VASOMOTOR SYMPTOMS, HOT FLASHES 10/23/2006  . HYPOTHYROIDISM 10/11/2006  . GLAUCOMA 10/11/2006  . HYPERTENSION 10/11/2006  . GERD 10/11/2006  . RENAL CALCULUS 10/11/2006   Past Medical History  Diagnosis Date  . GERD (gastroesophageal reflux disease)   . Hypertension   . Hyperlipidemia   .  Hyperthyroidism   . Menopausal syndrome   . Conjunctivitis     chronic  . Glaucoma(365)    Past Surgical History  Procedure Laterality Date  . Cholecystectomy    . Abdominal hysterectomy      partial ? prolapse  . Tonsillectomy    . Esophagogastroduodenoscopy     History  Substance Use Topics  . Smoking status: Former Research scientist (life sciences)  . Smokeless tobacco: Not on file  . Alcohol Use: Yes     Comment: occ   Family History  Problem Relation Age of Onset  . Heart disease Mother     CAD  . Hypertension Mother   . Glaucoma Mother   . Heart disease Father     CAD  . Diabetes Father   . Hypertension Father   . Cancer Father     ? CA   Allergies  Allergen Reactions  . Codeine     REACTION: Throat swelling  . Ivp Dye (Iodinated Diagnostic Agents) Swelling   Current Outpatient Prescriptions on File Prior to Visit  Medication Sig Dispense Refill  . Biotin 2500 MCG CAPS Take 1 capsule by mouth daily.        . Calcium Carbonate-Vitamin D 600-400 MG-UNIT per tablet Take 1 tablet by mouth daily.       Marland Kitchen latanoprost (XALATAN) 0.005 % ophthalmic solution  Place 1 drop into both eyes at bedtime.        Marland Kitchen levothyroxine (SYNTHROID, LEVOTHROID) 150 MCG tablet Take 1 tablet (150 mcg total) by mouth daily.  90 tablet  3  . loratadine (CLARITIN) 10 MG tablet Take 10 mg by mouth daily as needed.        . metoprolol succinate (TOPROL-XL) 100 MG 24 hr tablet Take 1 tablet (100 mg total) by mouth daily. Take with or immediately following a meal.  90 tablet  3  . Multiple Vitamin (MULTIVITAMIN) capsule Take 1 capsule by mouth daily.        . pantoprazole (PROTONIX) 40 MG tablet Take 1 tablet (40 mg total) by mouth daily.  90 tablet  3  . potassium chloride SA (K-DUR,KLOR-CON) 20 MEQ tablet Take 1 tablet (20 mEq total) by mouth daily.  90 tablet  3  . triamterene-hydrochlorothiazide (MAXZIDE-25) 37.5-25 MG per tablet Take 1 each (1 tablet total) by mouth daily.  90 tablet  3  .  triamterene-hydrochlorothiazide (DYAZIDE) 37.5-25 MG per capsule Take 1 capsule by mouth daily.  90 capsule  0   No current facility-administered medications on file prior to visit.    Review of Systems Review of Systems  Constitutional: Negative for fever, appetite change,  and unexpected weight change.  Eyes: Negative for pain and visual disturbance.  Respiratory: Negative for cough and shortness of breath.   Cardiovascular: Negative for cp or palpitations    Gastrointestinal: Negative for , diarrhea and constipation. pos for abd pain/ nausea/ neg for vomiting and blood in stool Genitourinary: Negative for urgency and frequency.  Skin: Negative for pallor or rash   Neurological: Negative for weakness, light-headedness, numbness and headaches.  Hematological: Negative for adenopathy. Does not bruise/bleed easily.  Psychiatric/Behavioral: Negative for dysphoric mood. The patient is not nervous/anxious.         Objective:   Physical Exam  Constitutional: She appears well-developed and well-nourished. No distress.  obese and well appearing   HENT:  Head: Normocephalic and atraumatic.  Mouth/Throat: Oropharynx is clear and moist.  Eyes: Conjunctivae and EOM are normal. Pupils are equal, round, and reactive to light. Right eye exhibits no discharge. Left eye exhibits no discharge. No scleral icterus.  Neck: Normal range of motion. Neck supple.  Cardiovascular: Normal rate and regular rhythm.   Pulmonary/Chest: Effort normal and breath sounds normal. No respiratory distress. She has no wheezes. She has no rales.  Abdominal: Soft. Bowel sounds are normal. She exhibits no distension and no mass. There is tenderness. There is no rebound and no guarding.  Tender diffusely  Nl bs  No M detected Neg murphy sign  Musculoskeletal: She exhibits no edema.  Lymphadenopathy:    She has no cervical adenopathy.  Neurological: She is alert. She has normal reflexes.  Skin: Skin is warm and dry. No  rash noted. No erythema. No pallor.  Psychiatric: She has a normal mood and affect.          Assessment & Plan:

## 2012-10-17 NOTE — Assessment & Plan Note (Addendum)
Acute on chronic cramping/ gas like pain - over whole abd in spells  Wide diff incl colitis/ diverticulosis/ celiac/ obstruction/ pancreatitis Selena Johnson (she has had hyst and ccy in past) Lab today  CT of abd and pelvis ordered  Pend result  May benefit from librax or other med for cramps Will likely need GI consult colonosc 07- pt reports tortuous colon

## 2012-10-19 ENCOUNTER — Ambulatory Visit (INDEPENDENT_AMBULATORY_CARE_PROVIDER_SITE_OTHER)
Admission: RE | Admit: 2012-10-19 | Discharge: 2012-10-19 | Disposition: A | Discharge status: Home / Self Care | Payer: BC Managed Care – PPO | Source: Ambulatory Visit | Attending: Family Medicine | Admitting: Family Medicine

## 2012-10-19 DIAGNOSIS — R109 Unspecified abdominal pain: Secondary | ICD-10-CM

## 2012-10-22 ENCOUNTER — Encounter: Payer: Self-pay | Admitting: Radiology

## 2012-10-23 ENCOUNTER — Encounter: Payer: Self-pay | Admitting: Family Medicine

## 2012-10-23 ENCOUNTER — Ambulatory Visit (INDEPENDENT_AMBULATORY_CARE_PROVIDER_SITE_OTHER): Payer: BC Managed Care – PPO | Admitting: Family Medicine

## 2012-10-23 VITALS — BP 136/76 | HR 75 | Temp 98.3°F | Ht 65.5 in | Wt 230.8 lb

## 2012-10-23 DIAGNOSIS — E876 Hypokalemia: Secondary | ICD-10-CM

## 2012-10-23 DIAGNOSIS — R109 Unspecified abdominal pain: Secondary | ICD-10-CM

## 2012-10-23 DIAGNOSIS — R7401 Elevation of levels of liver transaminase levels: Secondary | ICD-10-CM

## 2012-10-23 DIAGNOSIS — K7460 Unspecified cirrhosis of liver: Secondary | ICD-10-CM | POA: Insufficient documentation

## 2012-10-23 LAB — POTASSIUM: Potassium: 3.7 mEq/L (ref 3.5–5.1)

## 2012-10-23 NOTE — Assessment & Plan Note (Signed)
Pt is obese- with episodes of abd pain (lifelong), ccy in past, ? Hx of brief jaundice as a child, (brother had hep c), and pt has hx of some binge drinking in the past  Adv to eliminate etoh and also avoid acetaminophen Hepatitis panel today Ref to GI

## 2012-10-23 NOTE — Progress Notes (Signed)
Subjective:    Patient ID: Selena Johnson, female    DOB: Oct 16, 1950, 62 y.o.   MRN: [id number]  HPI Here for f/u of abd pain and gas after CT  CT abd and pelvis showed evidence of cirrhosis with small focal lesion that may be a hepatoma Also borderline splenomegally   Symptoms are bit better   Lab Results  Component Value Date   ALT 38* 10/17/2012   AST 48* 10/17/2012   ALKPHOS 74 10/17/2012   BILITOT 2.1* 10/17/2012   Wt is down 3 lb - obese with bmi of 37  ccy in past  Has never been a big drinker of alcohol  When she travels at most - she may have 3-4 drinks in a day at most , and if she goes out to eat may have one glass of wine or one beer  Never had hepatitis that she knows of and has never been tested  As a child she may have had jaundice once ?  Brother had hep c- thinks it was sexually transmitted  Never had hepatitis imms  Never had a blood transfusion or done IV drugs  Does not take acetaminophen and never has  Takes mvi/ milk thistle / biotin   Patient Active Problem List   Diagnosis Date Noted  . Cirrhosis of liver without mention of alcohol 10/23/2012  . Abdominal pain, unspecified site 10/17/2012  . Hypokalemia 07/13/2012  . Thrombocytopenia 11/10/2010  . Routine general medical examination at a health care facility 11/04/2010  . HYPERLIPIDEMIA 03/18/2008  . OBESITY, UNSPECIFIED 03/18/2008  . TRANSAMINASES, SERUM, ELEVATED 03/18/2008  . MENOPAUSE-RELATED VASOMOTOR SYMPTOMS, HOT FLASHES 10/23/2006  . HYPOTHYROIDISM 10/11/2006  . GLAUCOMA 10/11/2006  . HYPERTENSION 10/11/2006  . GERD 10/11/2006  . RENAL CALCULUS 10/11/2006   Past Medical History  Diagnosis Date  . GERD (gastroesophageal reflux disease)   . Hypertension   . Hyperlipidemia   . Hyperthyroidism   . Menopausal syndrome   . Conjunctivitis     chronic  . Glaucoma(365)    Past Surgical History  Procedure Laterality Date  . Cholecystectomy    . Abdominal hysterectomy      partial ?  prolapse  . Tonsillectomy    . Esophagogastroduodenoscopy     History  Substance Use Topics  . Smoking status: Former Research scientist (life sciences)  . Smokeless tobacco: Not on file  . Alcohol Use: No     Comment: occ   Family History  Problem Relation Age of Onset  . Heart disease Mother     CAD  . Hypertension Mother   . Glaucoma Mother   . Heart disease Father     CAD  . Diabetes Father   . Hypertension Father   . Cancer Father     ? CA   Allergies  Allergen Reactions  . Codeine     REACTION: Throat swelling  . Ivp Dye (Iodinated Diagnostic Agents) Swelling   Current Outpatient Prescriptions on File Prior to Visit  Medication Sig Dispense Refill  . ALPRAZolam (XANAX) 0.5 MG tablet Take 1 pill one hour before your procedure  2 tablet  0  . Biotin 2500 MCG CAPS Take 1 capsule by mouth daily.        . Calcium Carbonate-Vitamin D 600-400 MG-UNIT per tablet Take 1 tablet by mouth daily.       Marland Kitchen latanoprost (XALATAN) 0.005 % ophthalmic solution Place 1 drop into both eyes at bedtime.        Marland Kitchen levothyroxine (SYNTHROID, LEVOTHROID)  150 MCG tablet Take 1 tablet (150 mcg total) by mouth daily.  90 tablet  3  . loratadine (CLARITIN) 10 MG tablet Take 10 mg by mouth daily as needed.        . metoprolol succinate (TOPROL-XL) 100 MG 24 hr tablet Take 1 tablet (100 mg total) by mouth daily. Take with or immediately following a meal.  90 tablet  3  . Multiple Vitamin (MULTIVITAMIN) capsule Take 1 capsule by mouth daily.        . pantoprazole (PROTONIX) 40 MG tablet Take 1 tablet (40 mg total) by mouth daily.  90 tablet  3  . potassium chloride SA (K-DUR,KLOR-CON) 20 MEQ tablet Take 1 tablet (20 mEq total) by mouth daily.  90 tablet  3  . triamterene-hydrochlorothiazide (MAXZIDE-25) 37.5-25 MG per tablet Take 1 each (1 tablet total) by mouth daily.  90 tablet  3  . triamterene-hydrochlorothiazide (DYAZIDE) 37.5-25 MG per capsule Take 1 capsule by mouth daily.  90 capsule  0   No current facility-administered  medications on file prior to visit.    Review of Systems    Review of Systems  Constitutional: Negative for fever, appetite change, fatigue and unexpected weight change.  Eyes: Negative for pain and visual disturbance.  Respiratory: Negative for cough and shortness of breath.   Cardiovascular: Negative for cp or palpitations    Gastrointestinal: Negative for stool change or blood in stool or dark stool, pos for intermittent abd pain and gas, neg for jaundice .  Genitourinary: Negative for urgency and frequency.  Skin: Negative for pallor or rash   Neurological: Negative for weakness, light-headedness, numbness and headaches.  Hematological: Negative for adenopathy. Does not bruise/bleed easily.  Psychiatric/Behavioral: Negative for dysphoric mood. The patient is not nervous/anxious.      Objective:   Physical Exam  Constitutional: She appears well-developed and well-nourished. No distress.  obese and well appearing   HENT:  Head: Normocephalic and atraumatic.  Mouth/Throat: Oropharynx is clear and moist.  Eyes: EOM are normal. Pupils are equal, round, and reactive to light. Right eye exhibits no discharge. Left eye exhibits no discharge. No scleral icterus.  Chronic conj injection   Neck: Normal range of motion. Neck supple. No JVD present. Carotid bruit is not present. No thyromegaly present.  Cardiovascular: Normal rate, regular rhythm, normal heart sounds and intact distal pulses.  Exam reveals no gallop.   Pulmonary/Chest: Effort normal and breath sounds normal. No respiratory distress. She has no wheezes.  Abdominal: Soft. Bowel sounds are normal. She exhibits no distension, no abdominal bruit and no mass. There is no hepatosplenomegaly. There is no tenderness. There is no rebound, no guarding, no tenderness at McBurney's point and negative Murphy's sign.  Musculoskeletal: She exhibits no edema and no tenderness.  Lymphadenopathy:    She has no cervical adenopathy.   Neurological: She is alert. She has normal reflexes. No cranial nerve deficit. She exhibits normal muscle tone. Coordination normal.  Skin: Skin is warm and dry. No rash noted. No erythema. No pallor.  No jaundice   Psychiatric: She has a normal mood and affect.          Assessment & Plan:

## 2012-10-23 NOTE — Patient Instructions (Addendum)
Labs today  We will do GI referral at check out for liver issues and abdominal pain  Take care of yourself - avoid over the counter acetaminophen and absolutely avoid alcohol

## 2012-10-23 NOTE — Assessment & Plan Note (Signed)
Ref to GI- pt has elevated transaminases and bilirubin  Hepatitis tests today (of not her brother had hep c) CT showed cirrhosis -pt adv not to drink etoh

## 2012-10-23 NOTE — Assessment & Plan Note (Signed)
Low level after missed dose Re check today

## 2012-10-24 LAB — HEPATITIS PANEL, ACUTE
HCV Ab: NEGATIVE
Hep A IgM: NEGATIVE
Hep B C IgM: NEGATIVE

## 2012-10-25 ENCOUNTER — Encounter: Payer: Self-pay | Admitting: *Deleted

## 2012-10-25 ENCOUNTER — Ambulatory Visit (INDEPENDENT_AMBULATORY_CARE_PROVIDER_SITE_OTHER): Payer: BC Managed Care – PPO | Admitting: Gastroenterology

## 2012-10-25 ENCOUNTER — Other Ambulatory Visit (INDEPENDENT_AMBULATORY_CARE_PROVIDER_SITE_OTHER): Payer: BC Managed Care – PPO

## 2012-10-25 ENCOUNTER — Encounter: Payer: Self-pay | Admitting: Gastroenterology

## 2012-10-25 ENCOUNTER — Other Ambulatory Visit: Payer: BC Managed Care – PPO

## 2012-10-25 VITALS — BP 146/72 | HR 73 | Ht 65.5 in | Wt 230.0 lb

## 2012-10-25 DIAGNOSIS — R109 Unspecified abdominal pain: Secondary | ICD-10-CM

## 2012-10-25 DIAGNOSIS — R7401 Elevation of levels of liver transaminase levels: Secondary | ICD-10-CM

## 2012-10-25 DIAGNOSIS — R748 Abnormal levels of other serum enzymes: Secondary | ICD-10-CM

## 2012-10-25 DIAGNOSIS — Z23 Encounter for immunization: Secondary | ICD-10-CM

## 2012-10-25 DIAGNOSIS — K5900 Constipation, unspecified: Secondary | ICD-10-CM

## 2012-10-25 LAB — FERRITIN: Ferritin: 171.6 ng/mL (ref 10.0–291.0)

## 2012-10-25 LAB — PROTIME-INR: Prothrombin Time: 13.1 s — ABNORMAL HIGH (ref 10.2–12.4)

## 2012-10-25 LAB — AFP TUMOR MARKER: AFP-Tumor Marker: 11.7 ng/mL — ABNORMAL HIGH (ref 0.0–8.0)

## 2012-10-25 MED ORDER — HYOSCYAMINE SULFATE ER 0.375 MG PO TBCR: EXTENDED_RELEASE_TABLET | ORAL | Status: DC

## 2012-10-25 NOTE — Assessment & Plan Note (Signed)
Suspect cirrhosis as evidenced by nodularity on CT scan, thrombocytopenia and borderline splenomegaly. This is most likely on the basis of Selena Johnson. Patient claims that she was exposed to many chemicals in her years of work which raises the possibility of toxic exposure.  There may be a small hyperattenuating nodule the liver which raises the possibility of an early hepatoma. At minimal LFT abnormalities.  Recommendations #1 check AMA, ANA, ferritin, alpha-fetoprotein levels #2 upper endoscopy to rule out varices #3 vaccinations for hepatitis A and B., Pneumovax

## 2012-10-25 NOTE — Assessment & Plan Note (Signed)
Functional constipation. Plan trial of Linzess

## 2012-10-25 NOTE — Progress Notes (Signed)
History of Present Illness: Pleasant 62 year old white female referred at the request of Dr. Glori Bickers for evaluation of abdominal pain and possible cirrhosis. She suffers from chronic constipation and normally has a bowel movement about once a week. For the last few weeks  she's been complaining of fairly frequent crampy lower abdominal pain that may radiate to her upper abdomen and through to the back. Symptoms may be partially and temporarily relieved by a bowel movement. There is no history of melena or hematochezia. Recent CT, which I reviewed, demonstrated nodularity of the liver suggestive of cirrhosis. She hasa hyperattenuating area   in the lateral segment of the liver. Patient has no history of hepatitis or alcohol abuse. Serologies for hepatitis A, B, and C were negative. There is no history of hepatitis. Colonoscopy in 2007 was normal    Past Medical History  Diagnosis Date  . GERD (gastroesophageal reflux disease)   . Hypertension   . Hyperlipidemia   . Hyperthyroidism   . Menopausal syndrome   . Conjunctivitis     chronic  . Glaucoma(365)    Past Surgical History  Procedure Laterality Date  . Cholecystectomy    . Abdominal hysterectomy      partial ? prolapse  . Tonsillectomy    . Esophagogastroduodenoscopy     family history includes Cancer in her father; Diabetes in her father; Glaucoma in her mother; Heart disease in her father and mother; and Hypertension in her father and mother. Current Outpatient Prescriptions  Medication Sig Dispense Refill  . ALPRAZolam (XANAX) 0.5 MG tablet Take 1 pill one hour before your procedure  2 tablet  0  . Biotin 2500 MCG CAPS Take 1 capsule by mouth daily.        . Calcium Carbonate-Vitamin D 600-400 MG-UNIT per tablet Take 1 tablet by mouth daily.       Marland Kitchen latanoprost (XALATAN) 0.005 % ophthalmic solution Place 1 drop into both eyes at bedtime.        Marland Kitchen levothyroxine (SYNTHROID, LEVOTHROID) 150 MCG tablet Take 1 tablet (150 mcg total) by  mouth daily.  90 tablet  3  . loratadine (CLARITIN) 10 MG tablet Take 10 mg by mouth daily as needed.        . metoprolol succinate (TOPROL-XL) 100 MG 24 hr tablet Take 1 tablet (100 mg total) by mouth daily. Take with or immediately following a meal.  90 tablet  3  . Multiple Vitamin (MULTIVITAMIN) capsule Take 1 capsule by mouth daily.        . pantoprazole (PROTONIX) 40 MG tablet Take 1 tablet (40 mg total) by mouth daily.  90 tablet  3  . potassium chloride SA (K-DUR,KLOR-CON) 20 MEQ tablet Take 1 tablet (20 mEq total) by mouth daily.  90 tablet  3  . triamterene-hydrochlorothiazide (MAXZIDE-25) 37.5-25 MG per tablet Take 1 each (1 tablet total) by mouth daily.  90 tablet  3  . triamterene-hydrochlorothiazide (DYAZIDE) 37.5-25 MG per capsule Take 1 capsule by mouth daily.  90 capsule  0   No current facility-administered medications for this visit.   Allergies as of 10/25/2012 - Review Complete 10/25/2012  Allergen Reaction Noted  . Codeine  10/11/2006  . Ivp dye (iodinated diagnostic agents) Swelling 11/10/2010    reports that she has quit smoking. She does not have any smokeless tobacco history on file. She reports that she does not drink alcohol or use illicit drugs.     Review of Systems: Pertinent positive and negative review of systems  were noted in the above HPI section. All other review of systems were otherwise negative.  Vital signs were reviewed in today's medical record Physical Exam: General: Well developed , well nourished, no acute distress Skin: anicteric Head: Normocephalic and atraumatic Eyes:  sclerae anicteric, EOMI Ears: Normal auditory acuity Mouth: No deformity or lesions Neck: Supple, no masses or thyromegaly Lungs: Clear throughout to auscultation Heart: Regular rate and rhythm; no murmurs, rubs or bruits Abdomen: Soft, non tender and non distended. No masses, hepatosplenomegaly or hernias noted. Normal Bowel sounds Rectal:deferred Musculoskeletal:  Symmetrical with no gross deformities  Skin: No lesions on visible extremities Pulses:  Normal pulses noted Extremities: No clubbing, cyanosis, edema or deformities noted Neurological: Alert oriented x 4, grossly nonfocal Cervical Nodes:  No significant cervical adenopathy Inguinal Nodes: No significant inguinal adenopathy Psychological:  Alert and cooperative. Normal mood and affect

## 2012-10-25 NOTE — Assessment & Plan Note (Signed)
Abdominal pain most likely is due to  obstipation and constipation.  Recommendations #1 hyomax #2 trial of  Linzess

## 2012-10-25 NOTE — Patient Instructions (Addendum)
You have been scheduled for an endoscopy with propofol. Please follow written instructions given to you at your visit today. If you use inhalers (even only as needed), please bring them with you on the day of your procedure. Your physician has requested that you go to www.[email or web address].com and enter the access code given to you at your visit today. This web site gives a general overview about your procedure. However, you should still follow specific instructions given to you by our office regarding your preparation for the procedure.  You will go to the basement for labs today We are giving you a Hep A/B vaccine today You will need to come back in 7 days for 2nd injection You will need to come back in 21-30 days for the second injection And for the last injection in one year

## 2012-10-26 ENCOUNTER — Encounter: Payer: Self-pay | Admitting: Gastroenterology

## 2012-10-26 ENCOUNTER — Ambulatory Visit (AMBULATORY_SURGERY_CENTER): Payer: BC Managed Care – PPO | Admitting: Gastroenterology

## 2012-10-26 VITALS — BP 131/86 | HR 65 | Temp 97.2°F | Resp 14 | Ht 65.0 in | Wt 230.0 lb

## 2012-10-26 DIAGNOSIS — R109 Unspecified abdominal pain: Secondary | ICD-10-CM

## 2012-10-26 DIAGNOSIS — K2970 Gastritis, unspecified, without bleeding: Secondary | ICD-10-CM

## 2012-10-26 DIAGNOSIS — R7402 Elevation of levels of lactic acid dehydrogenase (LDH): Secondary | ICD-10-CM

## 2012-10-26 DIAGNOSIS — K2990 Gastroduodenitis, unspecified, without bleeding: Secondary | ICD-10-CM

## 2012-10-26 DIAGNOSIS — I8500 Esophageal varices without bleeding: Secondary | ICD-10-CM

## 2012-10-26 DIAGNOSIS — R7401 Elevation of levels of liver transaminase levels: Secondary | ICD-10-CM

## 2012-10-26 LAB — ANA: Anti Nuclear Antibody(ANA): NEGATIVE

## 2012-10-26 LAB — MITOCHONDRIAL ANTIBODIES: Mitochondrial M2 Ab, IgG: 0.41 (ref <0.91)

## 2012-10-26 MED ORDER — SODIUM CHLORIDE 0.9 % IV SOLN: 500.0000 mL | INTRAVENOUS | Status: DC

## 2012-10-26 NOTE — Progress Notes (Signed)
Patient did not have preoperative order for IV antibiotic SSI prophylaxis. (G8918)  Patient did not experience any of the following events: a burn prior to discharge; a fall within the facility; wrong site/side/patient/procedure/implant event; or a hospital transfer or hospital admission upon discharge from the facility. (G8907)  

## 2012-10-26 NOTE — Op Note (Signed)
[place]  [employer]. [address], [address]   ENDOSCOPY PROCEDURE REPORT  PATIENT: Selena, Johnson  MR#: [id number] BIRTHDATE: 14-Oct-1950 , 62  yrs. old GENDER: Female ENDOSCOPIST: Inda Castle, MD REFERRED BY:  Abner Greenspan, M.D. PROCEDURE DATE:  10/26/2012 PROCEDURE:  EGD w/ biopsy ASA CLASS:     Class II INDICATIONS:  Screening for varices. MEDICATIONS: MAC sedation, administered by CRNA, propofol (Diprivan) 235m IV, and Simethicone 0.6cc PO TOPICAL ANESTHETIC: Cetacaine Spray  DESCRIPTION OF PROCEDURE: After the risks benefits and alternatives of the procedure were thoroughly explained, informed consent was obtained.  The LB [id number] was introduced through the mouth and advanced to the second portion of the duodenum. Without limitations.  The instrument was slowly withdrawn as the mucosa was fully examined.      There were trace varices in the distal one third of the esophagus. In the gastric antrum there was moderate erythema.  Biopsies were taken.   The remainder of the upper endoscopy exam was otherwise normal.  Retroflexed views revealed no abnormalities.     The scope was then withdrawn from the patient and the procedure completed.  COMPLICATIONS: There were no complications. ENDOSCOPIC IMPRESSION: 1.   Trace esophageal varices 2.  gastritis  RECOMMENDATIONS: repeat endoscopy one year REPEAT EXAM:  eSigned:  RInda Castle MD 10/26/2012 8:48 AM   CC:

## 2012-10-26 NOTE — Progress Notes (Signed)
Called to room to assist during endoscopic procedure.  Patient ID and intended procedure confirmed with present staff. Received instructions for my participation in the procedure from the performing physician.  

## 2012-10-26 NOTE — Patient Instructions (Addendum)
YOU HAD AN ENDOSCOPIC PROCEDURE TODAY AT [facility]: Refer to the procedure report that was given to you for any specific questions about what was found during the examination.  If the procedure report does not answer your questions, please call your gastroenterologist to clarify.  If you requested that your care partner not be given the details of your procedure findings, then the procedure report has been included in a sealed envelope for you to review at your convenience later.  YOU SHOULD EXPECT: Some feelings of bloating in the abdomen. Passage of more gas than usual.  Walking can help get rid of the air that was put into your GI tract during the procedure and reduce the bloating. If you had a lower endoscopy (such as a colonoscopy or flexible sigmoidoscopy) you may notice spotting of blood in your stool or on the toilet paper. If you underwent a bowel prep for your procedure, then you may not have a normal bowel movement for a few days.  DIET: Your first meal following the procedure should be a light meal and then it is ok to progress to your normal diet.  A half-sandwich or bowl of soup is an example of a good first meal.  Heavy or fried foods are harder to digest and may make you feel nauseous or bloated.  Likewise meals heavy in dairy and vegetables can cause extra gas to form and this can also increase the bloating.  Drink plenty of fluids but you should avoid alcoholic beverages for 24 hours.  ACTIVITY: Your care partner should take you home directly after the procedure.  You should plan to take it easy, moving slowly for the rest of the day.  You can resume normal activity the day after the procedure however you should NOT DRIVE or use heavy machinery for 24 hours (because of the sedation medicines used during the test).    SYMPTOMS TO REPORT IMMEDIATELY: A gastroenterologist can be reached at any hour.  During normal business hours, 8:30 AM to 5:00 PM Monday through Friday,  call [phone].  After hours and on weekends, please call the GI answering service at [phone] who will take a message and have the physician on call contact you.   Following upper endoscopy (EGD)  Vomiting of blood or coffee ground material  New chest pain or pain under the shoulder blades  Painful or persistently difficult swallowing  New shortness of breath  Fever of 100F or higher  Black, tarry-looking stools  FOLLOW UP: If any biopsies were taken you will be contacted by phone or by letter within the next 1-3 weeks.  Call your gastroenterologist if you have not heard about the biopsies in 3 weeks.  Our staff will call the home number listed on your records the next business day following your procedure to check on you and address any questions or concerns that you may have at that time regarding the information given to you following your procedure. This is a courtesy call and so if there is no answer at the home number and we have not heard from you through the emergency physician on call, we will assume that you have returned to your regular daily activities without incident.  SIGNATURES/CONFIDENTIALITY: You and/or your care partner have signed paperwork which will be entered into your electronic medical record.  These signatures attest to the fact that that the information above on your After Visit Summary has been reviewed and is understood.  Full responsibility  of the confidentiality of this discharge information lies with you and/or your care-partner.

## 2012-10-30 ENCOUNTER — Telehealth: Payer: Self-pay

## 2012-10-30 NOTE — Telephone Encounter (Signed)
No answer, left message on voice mail to call back if any problems

## 2012-10-31 ENCOUNTER — Other Ambulatory Visit: Payer: Self-pay | Admitting: Gastroenterology

## 2012-10-31 DIAGNOSIS — R772 Abnormality of alphafetoprotein: Secondary | ICD-10-CM

## 2012-11-01 ENCOUNTER — Ambulatory Visit (INDEPENDENT_AMBULATORY_CARE_PROVIDER_SITE_OTHER): Payer: BC Managed Care – PPO | Admitting: Gastroenterology

## 2012-11-01 DIAGNOSIS — Z23 Encounter for immunization: Secondary | ICD-10-CM

## 2012-11-01 DIAGNOSIS — K5900 Constipation, unspecified: Secondary | ICD-10-CM

## 2012-11-01 DIAGNOSIS — R109 Unspecified abdominal pain: Secondary | ICD-10-CM

## 2012-11-01 DIAGNOSIS — R748 Abnormal levels of other serum enzymes: Secondary | ICD-10-CM

## 2012-11-07 ENCOUNTER — Encounter: Payer: Self-pay | Admitting: Gastroenterology

## 2012-11-26 ENCOUNTER — Ambulatory Visit (INDEPENDENT_AMBULATORY_CARE_PROVIDER_SITE_OTHER): Payer: BC Managed Care – PPO | Admitting: Gastroenterology

## 2012-11-26 DIAGNOSIS — R748 Abnormal levels of other serum enzymes: Secondary | ICD-10-CM

## 2012-11-26 DIAGNOSIS — R109 Unspecified abdominal pain: Secondary | ICD-10-CM

## 2012-11-26 DIAGNOSIS — K5900 Constipation, unspecified: Secondary | ICD-10-CM

## 2012-11-26 DIAGNOSIS — Z23 Encounter for immunization: Secondary | ICD-10-CM

## 2013-01-01 ENCOUNTER — Telehealth: Payer: Self-pay | Admitting: Family Medicine

## 2013-01-01 DIAGNOSIS — E785 Hyperlipidemia, unspecified: Secondary | ICD-10-CM

## 2013-01-01 DIAGNOSIS — I10 Essential (primary) hypertension: Secondary | ICD-10-CM

## 2013-01-01 DIAGNOSIS — R7401 Elevation of levels of liver transaminase levels: Secondary | ICD-10-CM

## 2013-01-01 DIAGNOSIS — E876 Hypokalemia: Secondary | ICD-10-CM

## 2013-01-01 NOTE — Telephone Encounter (Signed)
lab

## 2013-01-01 NOTE — Telephone Encounter (Signed)
Message copied by Abner Greenspan on Tue Jan 01, 2013 10:10 PM ------      Message from: Ellamae Sia      Created: Thu Dec 20, 2012  3:17 PM      Regarding: Lab orders for Wednesday, 7.30.14       Labs for a 6 month f/u ------

## 2013-01-02 ENCOUNTER — Other Ambulatory Visit (INDEPENDENT_AMBULATORY_CARE_PROVIDER_SITE_OTHER): Payer: BC Managed Care – PPO

## 2013-01-02 DIAGNOSIS — R7401 Elevation of levels of liver transaminase levels: Secondary | ICD-10-CM

## 2013-01-02 DIAGNOSIS — E876 Hypokalemia: Secondary | ICD-10-CM

## 2013-01-02 DIAGNOSIS — I10 Essential (primary) hypertension: Secondary | ICD-10-CM

## 2013-01-02 DIAGNOSIS — E785 Hyperlipidemia, unspecified: Secondary | ICD-10-CM

## 2013-01-02 LAB — COMPREHENSIVE METABOLIC PANEL
ALT: 32 U/L (ref 0–35)
AST: 45 U/L — ABNORMAL HIGH (ref 0–37)
BUN: 10 mg/dL (ref 6–23)
CO2: 26 mEq/L (ref 19–32)
Calcium: 9.5 mg/dL (ref 8.4–10.5)
Chloride: 105 mEq/L (ref 96–112)
Creatinine, Ser: 0.7 mg/dL (ref 0.4–1.2)
GFR: 90.97 mL/min (ref >60.00)
Total Bilirubin: 1.4 mg/dL — ABNORMAL HIGH (ref 0.3–1.2)

## 2013-01-02 LAB — LIPID PANEL
HDL: 46.6 mg/dL (ref >39.00)
Triglycerides: 114 mg/dL (ref 0.0–149.0)
VLDL: 22.8 mg/dL (ref 0.0–40.0)

## 2013-01-09 ENCOUNTER — Ambulatory Visit (INDEPENDENT_AMBULATORY_CARE_PROVIDER_SITE_OTHER): Payer: BC Managed Care – PPO | Admitting: Family Medicine

## 2013-01-09 ENCOUNTER — Encounter: Payer: Self-pay | Admitting: Family Medicine

## 2013-01-09 VITALS — BP 126/80 | HR 88 | Temp 97.9°F | Ht 65.5 in | Wt 231.2 lb

## 2013-01-09 DIAGNOSIS — R7401 Elevation of levels of liver transaminase levels: Secondary | ICD-10-CM

## 2013-01-09 DIAGNOSIS — E669 Obesity, unspecified: Secondary | ICD-10-CM

## 2013-01-09 DIAGNOSIS — K7460 Unspecified cirrhosis of liver: Secondary | ICD-10-CM

## 2013-01-09 DIAGNOSIS — I10 Essential (primary) hypertension: Secondary | ICD-10-CM

## 2013-01-09 NOTE — Progress Notes (Signed)
Subjective:    Patient ID: Selena Johnson, female    DOB: 1950/11/23, 62 y.o.   MRN: [id number]  HPI Here for f/u of HTN and cirrhosis from suspected NASH   Seen by GI  Autoimmune tests done EGD pos for trace varices Hep A and B imms given  Lab Results  Component Value Date   ALT 32 01/02/2013   AST 45* 01/02/2013   ALKPHOS 79 01/02/2013   BILITOT 1.4* 01/02/2013   This is stable to slt improved  Lab Results  Component Value Date   CHOL 213* 01/02/2013   CHOL 213* 07/06/2012   CHOL 193 11/05/2010   Lab Results  Component Value Date   HDL 46.60 01/02/2013   HDL 47.90 07/06/2012   HDL 52.50 11/05/2010   Lab Results  Component Value Date   LDLCALC 122* 11/05/2010   LDLCALC 132* 10/24/2007   LDLCALC 124* 01/26/2007   Lab Results  Component Value Date   TRIG 114.0 01/02/2013   TRIG 133.0 07/06/2012   TRIG 95.0 11/05/2010   Lab Results  Component Value Date   CHOLHDL 5 01/02/2013   CHOLHDL 4 07/06/2012   CHOLHDL 4 11/05/2010   Lab Results  Component Value Date   LDLDIRECT 144.9 01/02/2013   LDLDIRECT 139.8 07/06/2012   LDLDIRECT 171.0 11/03/2009   she thinks the reason LDL is up - is because she is care of a sick cousin - and she is getting him milkshakes and she is eating them   Wt is up 1 lb with bmi of 37  bp is stable today  No cp or palpitations or headaches or edema  No side effects to medicines  BP Readings from Last 3 Encounters:  01/09/13 126/80  10/26/12 131/86  10/25/12 146/72      Patient Active Problem List   Diagnosis Date Noted  . Unspecified constipation 10/25/2012  . Cirrhosis of liver without mention of alcohol 10/23/2012  . Abdominal pain, unspecified site 10/17/2012  . Hypokalemia 07/13/2012  . Thrombocytopenia 11/10/2010  . Routine general medical examination at a health care facility 11/04/2010  . HYPERLIPIDEMIA 03/18/2008  . OBESITY, UNSPECIFIED 03/18/2008  . TRANSAMINASES, SERUM, ELEVATED 03/18/2008  . MENOPAUSE-RELATED VASOMOTOR SYMPTOMS, HOT  FLASHES 10/23/2006  . HYPOTHYROIDISM 10/11/2006  . GLAUCOMA 10/11/2006  . HYPERTENSION 10/11/2006  . GERD 10/11/2006  . RENAL CALCULUS 10/11/2006   Past Medical History  Diagnosis Date  . GERD (gastroesophageal reflux disease)   . Hypertension   . Hyperthyroidism   . Menopausal syndrome   . Conjunctivitis     chronic  . Glaucoma   . Allergy   . Cancer     basal cell skin CA  . Hyperlipidemia     no per pt  . Chronic kidney disease     hx of kidney stones   Past Surgical History  Procedure Laterality Date  . Cholecystectomy    . Abdominal hysterectomy      partial ? prolapse  . Tonsillectomy    . Esophagogastroduodenoscopy     History  Substance Use Topics  . Smoking status: Former Research scientist (life sciences)  . Smokeless tobacco: Former Systems developer  . Alcohol Use: No     Comment: occ   Family History  Problem Relation Age of Onset  . Heart disease Mother     CAD  . Hypertension Mother   . Glaucoma Mother   . Heart disease Father     CAD  . Diabetes Father   . Hypertension Father   .  Cancer Father     ? CA  . Colon cancer Neg Hx   . Esophageal cancer Neg Hx   . Rectal cancer Neg Hx   . Stomach cancer Neg Hx    Allergies  Allergen Reactions  . Codeine     REACTION: Throat swelling  . Ivp Dye (Iodinated Diagnostic Agents) Swelling   Current Outpatient Prescriptions on File Prior to Visit  Medication Sig Dispense Refill  . Biotin 2500 MCG CAPS Take 1 capsule by mouth daily.        . Calcium Carbonate-Vitamin D 600-400 MG-UNIT per tablet Take 1 tablet by mouth daily.       Marland Kitchen latanoprost (XALATAN) 0.005 % ophthalmic solution Place 1 drop into both eyes at bedtime.        Marland Kitchen levothyroxine (SYNTHROID, LEVOTHROID) 150 MCG tablet Take 1 tablet (150 mcg total) by mouth daily.  90 tablet  3  . loratadine (CLARITIN) 10 MG tablet Take 10 mg by mouth daily as needed.        . metoprolol succinate (TOPROL-XL) 100 MG 24 hr tablet Take 1 tablet (100 mg total) by mouth daily. Take with or  immediately following a meal.  90 tablet  3  . Multiple Vitamin (MULTIVITAMIN) capsule Take 1 capsule by mouth daily.        . pantoprazole (PROTONIX) 40 MG tablet Take 1 tablet (40 mg total) by mouth daily.  90 tablet  3  . potassium chloride SA (K-DUR,KLOR-CON) 20 MEQ tablet Take 1 tablet (20 mEq total) by mouth daily.  90 tablet  3  . triamterene-hydrochlorothiazide (MAXZIDE-25) 37.5-25 MG per tablet Take 1 each (1 tablet total) by mouth daily.  90 tablet  3   No current facility-administered medications on file prior to visit.    Review of Systems     Objective:   Physical Exam        Assessment & Plan:

## 2013-01-09 NOTE — Patient Instructions (Addendum)
Take care of yourself  For cholesterol and cirrhosis you must start working on healthy diet and exercise for weight loss  This is not a lost cause - use moderation  Avoid red meat/ fried foods/ egg yolks/ fatty breakfast meats/ butter, cheese and high fat dairy/ and shellfish   Schedule annual exam in 6 months with labs prior

## 2013-01-10 NOTE — Assessment & Plan Note (Signed)
Due to NASH-s/p GI w/u -also with mild esoph varicies Long disc about need for wt loss- but pt does not seem ready to work on that yet  Enc her to give it more consideration and disc risks

## 2013-01-10 NOTE — Assessment & Plan Note (Signed)
Discussed how this problem influences overall health and the risks it imposes  Reviewed plan for weight loss with lower calorie diet (via better food choices and also portion control or program like weight watchers) and exercise building up to or more than 30 minutes 5 days per week including some aerobic activity   Pt does not seem motivated to work on this  She understands how this causes NASH and cirrhosis

## 2013-01-10 NOTE — Assessment & Plan Note (Signed)
bp in fair control at this time  No changes needed  Disc lifstyle change with low sodium diet and exercise   

## 2013-01-10 NOTE — Assessment & Plan Note (Signed)
Due to NASH after GI w/u- with evidence of cirrhosis Lab Results  Component Value Date   ALT 32 01/02/2013   AST 45* 01/02/2013   ALKPHOS 79 01/02/2013   BILITOT 1.4* 01/02/2013   slt imroved Disc imp of wt loss for this

## 2013-01-29 ENCOUNTER — Other Ambulatory Visit: Payer: Self-pay | Admitting: Physician Assistant

## 2013-02-19 ENCOUNTER — Telehealth: Payer: Self-pay

## 2013-02-19 NOTE — Telephone Encounter (Signed)
Try dividing it in 1/2 and take it with food - but if not improved in a week or so please let me know

## 2013-02-19 NOTE — Telephone Encounter (Signed)
Pt left v/m; pt taking generic KCL and having abdominal cramping and bloating. Pt wants to know if could break tab in half and take 1/2 tab in AM and 1/2 tab in PM to see if would help abd cramping or could pt change to a different K supplement.Please advise.[place]

## 2013-02-19 NOTE — Telephone Encounter (Signed)
Pt advise it's okay to take 1/2 tab BID but update [place] if no improvement

## 2013-04-11 ENCOUNTER — Other Ambulatory Visit: Payer: Self-pay

## 2013-05-21 ENCOUNTER — Ambulatory Visit (INDEPENDENT_AMBULATORY_CARE_PROVIDER_SITE_OTHER): Payer: BC Managed Care – PPO | Admitting: Family Medicine

## 2013-05-21 ENCOUNTER — Encounter: Payer: Self-pay | Admitting: Family Medicine

## 2013-05-21 VITALS — BP 110/68 | HR 70 | Temp 98.1°F | Ht 65.5 in | Wt 221.8 lb

## 2013-05-21 DIAGNOSIS — N390 Urinary tract infection, site not specified: Secondary | ICD-10-CM

## 2013-05-21 DIAGNOSIS — R300 Dysuria: Secondary | ICD-10-CM

## 2013-05-21 LAB — POCT URINALYSIS DIPSTICK
Ketones, UA: NEGATIVE
Protein, UA: NEGATIVE
Spec Grav, UA: <=1.005
Urobilinogen, UA: 0.2
pH, UA: 6

## 2013-05-21 LAB — POCT UA - MICROSCOPIC ONLY
Casts, Ur, LPF, POC: 0
Yeast, UA: 0

## 2013-05-21 MED ORDER — CIPROFLOXACIN HCL 250 MG PO TABS: 250.0000 mg | ORAL_TABLET | Freq: Two times a day (BID) | ORAL | Status: DC

## 2013-05-21 NOTE — Progress Notes (Signed)
Subjective:    Patient ID: Selena Johnson, female    DOB: Apr 09, 1951, 62 y.o.   MRN: [id number]  HPI Here with urinary symptoms  Noticed more frequency lately - was drinking more coffee  Today - urinated and had urge to keep going with burning sensation Had blood in urine as well  More painful to urinate as the day goes on  Bladder feels full - a lot of pelvic pressure   No fever or nausea   She had kidney stone in the past -- in her early 65s None since No pain right now    ua has leukocytes and blood   Patient Active Problem List   Diagnosis Date Noted  . Unspecified constipation 10/25/2012  . Cirrhosis of liver without mention of alcohol 10/23/2012  . Abdominal pain, unspecified site 10/17/2012  . Hypokalemia 07/13/2012  . Thrombocytopenia 11/10/2010  . Routine general medical examination at a health care facility 11/04/2010  . HYPERLIPIDEMIA 03/18/2008  . OBESITY, UNSPECIFIED 03/18/2008  . TRANSAMINASES, SERUM, ELEVATED 03/18/2008  . MENOPAUSE-RELATED VASOMOTOR SYMPTOMS, HOT FLASHES 10/23/2006  . HYPOTHYROIDISM 10/11/2006  . GLAUCOMA 10/11/2006  . HYPERTENSION 10/11/2006  . GERD 10/11/2006  . RENAL CALCULUS 10/11/2006   Past Medical History  Diagnosis Date  . GERD (gastroesophageal reflux disease)   . Hypertension   . Hyperthyroidism   . Menopausal syndrome   . Conjunctivitis     chronic  . Glaucoma   . Allergy   . Cancer     basal cell skin CA  . Hyperlipidemia     no per pt  . Chronic kidney disease     hx of kidney stones   Past Surgical History  Procedure Laterality Date  . Cholecystectomy    . Abdominal hysterectomy      partial ? prolapse  . Tonsillectomy    . Esophagogastroduodenoscopy     History  Substance Use Topics  . Smoking status: Former Research scientist (life sciences)  . Smokeless tobacco: Former Systems developer  . Alcohol Use: No     Comment: occ   Family History  Problem Relation Age of Onset  . Heart disease Mother     CAD  . Hypertension Mother   .  Glaucoma Mother   . Heart disease Father     CAD  . Diabetes Father   . Hypertension Father   . Cancer Father     ? CA  . Colon cancer Neg Hx   . Esophageal cancer Neg Hx   . Rectal cancer Neg Hx   . Stomach cancer Neg Hx    Allergies  Allergen Reactions  . Codeine     REACTION: Throat swelling  . Ivp Dye [Iodinated Diagnostic Agents] Swelling   Current Outpatient Prescriptions on File Prior to Visit  Medication Sig Dispense Refill  . Biotin 2500 MCG CAPS Take 1 capsule by mouth daily.        . Calcium Carbonate-Vitamin D 600-400 MG-UNIT per tablet Take 1 tablet by mouth daily.       Marland Kitchen latanoprost (XALATAN) 0.005 % ophthalmic solution Place 1 drop into both eyes at bedtime.        Marland Kitchen levothyroxine (SYNTHROID, LEVOTHROID) 150 MCG tablet Take 1 tablet (150 mcg total) by mouth daily.  90 tablet  3  . loratadine (CLARITIN) 10 MG tablet Take 10 mg by mouth daily as needed.        . metoprolol succinate (TOPROL-XL) 100 MG 24 hr tablet Take 1 tablet (100 mg total) by  mouth daily. Take with or immediately following a meal.  90 tablet  3  . Multiple Vitamin (MULTIVITAMIN) capsule Take 1 capsule by mouth daily.        . pantoprazole (PROTONIX) 40 MG tablet Take 1 tablet (40 mg total) by mouth daily.  90 tablet  3  . potassium chloride SA (K-DUR,KLOR-CON) 20 MEQ tablet Take 1 tablet (20 mEq total) by mouth daily.  90 tablet  3  . triamterene-hydrochlorothiazide (MAXZIDE-25) 37.5-25 MG per tablet Take 1 each (1 tablet total) by mouth daily.  90 tablet  3   No current facility-administered medications on file prior to visit.    Review of Systems Review of Systems  Constitutional: Negative for fever, appetite change, fatigue and unexpected weight change.  Eyes: Negative for pain and visual disturbance.  Respiratory: Negative for cough and shortness of breath.   Cardiovascular: Negative for cp or palpitations    Gastrointestinal: Negative for nausea, diarrhea and constipation.  Genitourinary:  pos  for urgency and frequency. pos for dysuria/ neg for vaginal d/c  Skin: Negative for pallor or rash   Neurological: Negative for weakness, light-headedness, numbness and headaches.  Hematological: Negative for adenopathy. Does not bruise/bleed easily.  Psychiatric/Behavioral: Negative for dysphoric mood. The patient is not nervous/anxious.         Objective:   Physical Exam  Constitutional: She appears well-developed and well-nourished. No distress.  obese and well appearing   HENT:  Head: Normocephalic and atraumatic.  Mouth/Throat: Oropharynx is clear and moist.  Eyes: Conjunctivae and EOM are normal. Pupils are equal, round, and reactive to light. No scleral icterus.  Neck: Normal range of motion. Neck supple.  Cardiovascular: Normal rate and regular rhythm.   Pulmonary/Chest: Effort normal and breath sounds normal. No respiratory distress. She has no wheezes. She has no rales.  Abdominal: Soft. Bowel sounds are normal. She exhibits no distension. There is tenderness in the suprapubic area. There is no rebound, no guarding and no CVA tenderness.  Musculoskeletal: She exhibits no edema.  Lymphadenopathy:    She has no cervical adenopathy.  Neurological: She is alert. She has normal reflexes.  Skin: Skin is warm and dry. No rash noted. No erythema. No pallor.  Psychiatric: She has a normal mood and affect.          Assessment & Plan:

## 2013-05-21 NOTE — Progress Notes (Signed)
Pre-visit discussion using our clinic review tool. No additional management support is needed unless otherwise documented below in the visit note.  

## 2013-05-21 NOTE — Patient Instructions (Signed)
Drink lots of water  Take the cipro for urinary tract infection Update if not starting to improve in several  or if worsening   We will call with urine culture result   Urinary Tract Infection Urinary tract infections (UTIs) can develop anywhere along your urinary tract. Your urinary tract is your body's drainage system for removing wastes and extra water. Your urinary tract includes two kidneys, two ureters, a bladder, and a urethra. Your kidneys are a pair of bean-shaped organs. Each kidney is about the size of your fist. They are located below your ribs, one on each side of your spine. CAUSES Infections are caused by microbes, which are microscopic organisms, including fungi, viruses, and bacteria. These organisms are so small that they can only be seen through a microscope. Bacteria are the microbes that most commonly cause UTIs. SYMPTOMS  Symptoms of UTIs may vary by age and gender of the patient and by the location of the infection. Symptoms in young women typically include a frequent and intense urge to urinate and a painful, burning feeling in the bladder or urethra during urination. Older women and men are more likely to be tired, shaky, and weak and have muscle aches and abdominal pain. A fever may mean the infection is in your kidneys. Other symptoms of a kidney infection include pain in your back or sides below the ribs, nausea, and vomiting. DIAGNOSIS To diagnose a UTI, your caregiver will ask you about your symptoms. Your caregiver also will ask to provide a urine sample. The urine sample will be tested for bacteria and white blood cells. White blood cells are made by your body to help fight infection. TREATMENT  Typically, UTIs can be treated with medication. Because most UTIs are caused by a bacterial infection, they usually can be treated with the use of antibiotics. The choice of antibiotic and length of treatment depend on your symptoms and the type of bacteria causing your  infection. HOME CARE INSTRUCTIONS  If you were prescribed antibiotics, take them exactly as your caregiver instructs you. Finish the medication even if you feel better after you have only taken some of the medication.  Drink enough water and fluids to keep your urine clear or pale yellow.  Avoid caffeine, tea, and carbonated beverages. They tend to irritate your bladder.  Empty your bladder often. Avoid holding urine for long periods of time.  Empty your bladder before and after sexual intercourse.  After a bowel movement, women should cleanse from front to back. Use each tissue only once. SEEK MEDICAL CARE IF:   You have back pain.  You develop a fever.  Your symptoms do not begin to resolve within 3 days. SEEK IMMEDIATE MEDICAL CARE IF:   You have severe back pain or lower abdominal pain.  You develop chills.  You have nausea or vomiting.  You have continued burning or discomfort with urination. MAKE SURE YOU:   Understand these instructions.  Will watch your condition.  Will get help right away if you are not doing well or get worse. Document Released: 03/02/2005 Document Revised: 11/22/2011 Document Reviewed: 07/01/2011 [facility] Patient Information 2014 [place].

## 2013-05-22 NOTE — Assessment & Plan Note (Signed)
tx with cipro  Enc water intake  Disc ways to prevent uti Urine cx pending  Handout given Update if not starting to improve in several days  or if worsening

## 2013-05-23 LAB — URINE CULTURE: Colony Count: NO GROWTH

## 2013-05-24 ENCOUNTER — Encounter: Payer: Self-pay | Admitting: Family Medicine

## 2013-05-29 ENCOUNTER — Telehealth: Payer: Self-pay | Admitting: Family Medicine

## 2013-05-29 DIAGNOSIS — R300 Dysuria: Secondary | ICD-10-CM | POA: Insufficient documentation

## 2013-05-29 DIAGNOSIS — N390 Urinary tract infection, site not specified: Secondary | ICD-10-CM

## 2013-05-29 NOTE — Telephone Encounter (Signed)
Referral to urology for dysuria with neg cx

## 2013-06-06 DIAGNOSIS — K760 Fatty (change of) liver, not elsewhere classified: Secondary | ICD-10-CM

## 2013-06-06 HISTORY — DX: Fatty (change of) liver, not elsewhere classified: K76.0

## 2013-06-28 ENCOUNTER — Other Ambulatory Visit: Payer: Self-pay | Admitting: Family Medicine

## 2013-06-28 NOTE — Telephone Encounter (Signed)
Physical scheduled for 07/2013.

## 2013-07-07 ENCOUNTER — Telehealth: Payer: Self-pay | Admitting: Family Medicine

## 2013-07-07 DIAGNOSIS — E785 Hyperlipidemia, unspecified: Secondary | ICD-10-CM

## 2013-07-07 DIAGNOSIS — Z0000 Encounter for general adult medical examination without abnormal findings: Secondary | ICD-10-CM

## 2013-07-07 NOTE — Telephone Encounter (Signed)
Message copied by Abner Greenspan on Sun Jul 07, 2013  9:24 PM ------      Message from: Ellamae Sia      Created: Tue Jul 02, 2013 12:26 PM      Regarding: Lab orders for Monday, 2.2.15       Patient is scheduled for CPX labs, please order future labs, Thanks , Terri       ------

## 2013-07-08 ENCOUNTER — Other Ambulatory Visit (INDEPENDENT_AMBULATORY_CARE_PROVIDER_SITE_OTHER): Payer: BC Managed Care – PPO

## 2013-07-08 DIAGNOSIS — E785 Hyperlipidemia, unspecified: Secondary | ICD-10-CM

## 2013-07-08 DIAGNOSIS — Z0000 Encounter for general adult medical examination without abnormal findings: Secondary | ICD-10-CM

## 2013-07-08 LAB — CBC WITH DIFFERENTIAL/PLATELET
BASOS PCT: 0.1 % (ref 0.0–3.0)
Basophils Absolute: 0 10*3/uL (ref 0.0–0.1)
EOS PCT: 3.9 % (ref 0.0–5.0)
Eosinophils Absolute: 0.2 10*3/uL (ref 0.0–0.7)
HCT: 44.8 % (ref 36.0–46.0)
Hemoglobin: 14.9 g/dL (ref 12.0–15.0)
Lymphocytes Relative: 36.6 % (ref 12.0–46.0)
Lymphs Abs: 2.1 10*3/uL (ref 0.7–4.0)
MCHC: 33.3 g/dL (ref 30.0–36.0)
MCV: 101.6 fl — AB (ref 78.0–100.0)
MONO ABS: 0.3 10*3/uL (ref 0.1–1.0)
Monocytes Relative: 5.1 % (ref 3.0–12.0)
Neutro Abs: 3.2 10*3/uL (ref 1.4–7.7)
Neutrophils Relative %: 54.3 % (ref 43.0–77.0)
PLATELETS: 88 10*3/uL — AB (ref 150.0–400.0)
RBC: 4.41 Mil/uL (ref 3.87–5.11)
RDW: 13.1 % (ref 11.5–14.6)
WBC: 5.9 10*3/uL (ref 4.5–10.5)

## 2013-07-08 LAB — COMPREHENSIVE METABOLIC PANEL
ALK PHOS: 88 U/L (ref 39–117)
ALT: 33 U/L (ref 0–35)
AST: 41 U/L — ABNORMAL HIGH (ref 0–37)
Albumin: 4.2 g/dL (ref 3.5–5.2)
BILIRUBIN TOTAL: 1.1 mg/dL (ref 0.3–1.2)
BUN: 11 mg/dL (ref 6–23)
CO2: 28 mEq/L (ref 19–32)
CREATININE: 0.8 mg/dL (ref 0.4–1.2)
Calcium: 9.7 mg/dL (ref 8.4–10.5)
Chloride: 106 mEq/L (ref 96–112)
GFR: 72.38 mL/min (ref >60.00)
GLUCOSE: 87 mg/dL (ref 70–99)
Potassium: 4.1 mEq/L (ref 3.5–5.1)
Sodium: 142 mEq/L (ref 135–145)
Total Protein: 7.3 g/dL (ref 6.0–8.3)

## 2013-07-08 LAB — LIPID PANEL
CHOLESTEROL: 224 mg/dL — AB (ref 0–200)
HDL: 54.5 mg/dL (ref >39.00)
TRIGLYCERIDES: 107 mg/dL (ref 0.0–149.0)
Total CHOL/HDL Ratio: 4
VLDL: 21.4 mg/dL (ref 0.0–40.0)

## 2013-07-08 LAB — LDL CHOLESTEROL, DIRECT: Direct LDL: 150.1 mg/dL

## 2013-07-08 LAB — TSH: TSH: 3.51 u[IU]/mL (ref 0.35–5.50)

## 2013-07-15 ENCOUNTER — Encounter: Payer: Self-pay | Admitting: Family Medicine

## 2013-07-15 ENCOUNTER — Ambulatory Visit (INDEPENDENT_AMBULATORY_CARE_PROVIDER_SITE_OTHER): Payer: BC Managed Care – PPO | Admitting: Family Medicine

## 2013-07-15 VITALS — BP 126/84 | HR 69 | Temp 98.2°F | Ht 65.5 in | Wt 223.0 lb

## 2013-07-15 DIAGNOSIS — E669 Obesity, unspecified: Secondary | ICD-10-CM

## 2013-07-15 DIAGNOSIS — I10 Essential (primary) hypertension: Secondary | ICD-10-CM

## 2013-07-15 DIAGNOSIS — E785 Hyperlipidemia, unspecified: Secondary | ICD-10-CM

## 2013-07-15 DIAGNOSIS — E039 Hypothyroidism, unspecified: Secondary | ICD-10-CM

## 2013-07-15 DIAGNOSIS — Z0000 Encounter for general adult medical examination without abnormal findings: Secondary | ICD-10-CM

## 2013-07-15 MED ORDER — PANTOPRAZOLE SODIUM 40 MG PO TBEC: 40.0000 mg | DELAYED_RELEASE_TABLET | Freq: Every day | ORAL | Status: DC

## 2013-07-15 MED ORDER — POTASSIUM CHLORIDE CRYS ER 20 MEQ PO TBCR: EXTENDED_RELEASE_TABLET | ORAL | Status: DC

## 2013-07-15 NOTE — Assessment & Plan Note (Signed)
Reviewed health habits including diet and exercise and skin cancer prevention Reviewed appropriate screening tests for age  Also reviewed health mt list, fam hx and immunization status , as well as social and family history   See HPI Labs reviewed She will schedule her own mammogram in April

## 2013-07-15 NOTE — Patient Instructions (Signed)
Don't forget to schedule your mammogram in April  Your cholesterol is mildly high-work on diet the best you can Keep exercising with goal of weight loss  Take care of yourself    Fat and Cholesterol Control Diet Fat and cholesterol levels in your blood and organs are influenced by your diet. High levels of fat and cholesterol may lead to diseases of the heart, small and large blood vessels, gallbladder, liver, and pancreas. CONTROLLING FAT AND CHOLESTEROL WITH DIET Although exercise and lifestyle factors are important, your diet is key. That is because certain foods are known to raise cholesterol and others to lower it. The goal is to balance foods for their effect on cholesterol and more importantly, to replace saturated and trans fat with other types of fat, such as monounsaturated fat, polyunsaturated fat, and omega-3 fatty acids. On average, a person should consume no more than 15 to 17 g of saturated fat daily. Saturated and trans fats are considered "bad" fats, and they will raise LDL cholesterol. Saturated fats are primarily found in animal products such as meats, butter, and cream. However, that does not mean you need to give up all your favorite foods. Today, there are good tasting, low-fat, low-cholesterol substitutes for most of the things you like to eat. Choose low-fat or nonfat alternatives. Choose round or loin cuts of red meat. These types of cuts are lowest in fat and cholesterol. Chicken (without the skin), fish, veal, and ground [place] breast are great choices. Eliminate fatty meats, such as hot dogs and salami. Even shellfish have little or no saturated fat. Have a 3 oz (85 g) portion when you eat lean meat, poultry, or fish. Trans fats are also called "partially hydrogenated oils." They are oils that have been scientifically manipulated so that they are solid at room temperature resulting in a longer shelf life and improved taste and texture of foods in which they are added. Trans  fats are found in stick margarine, some tub margarines, cookies, crackers, and baked goods.  When baking and cooking, oils are a great substitute for butter. The monounsaturated oils are especially beneficial since it is believed they lower LDL and raise HDL. The oils you should avoid entirely are saturated tropical oils, such as coconut and palm.  Remember to eat a lot from food groups that are naturally free of saturated and trans fat, including fish, fruit, vegetables, beans, grains (barley, rice, couscous, bulgur wheat), and pasta (without cream sauces).  IDENTIFYING FOODS THAT LOWER FAT AND CHOLESTEROL  Soluble fiber may lower your cholesterol. This type of fiber is found in fruits such as apples, vegetables such as broccoli, potatoes, and carrots, legumes such as beans, peas, and lentils, and grains such as barley. Foods fortified with plant sterols (phytosterol) may also lower cholesterol. You should eat at least 2 g per day of these foods for a cholesterol lowering effect.  Read package labels to identify low-saturated fats, trans fat free, and low-fat foods at the supermarket. Select cheeses that have only 2 to 3 g saturated fat per ounce. Use a heart-healthy tub margarine that is free of trans fats or partially hydrogenated oil. When buying baked goods (cookies, crackers), avoid partially hydrogenated oils. Breads and muffins should be made from whole grains (whole-wheat or whole oat flour, instead of "flour" or "enriched flour"). Buy non-creamy canned soups with reduced salt and no added fats.  FOOD PREPARATION TECHNIQUES  Never deep-fry. If you must fry, either stir-fry, which uses very little fat, or use  non-stick cooking sprays. When possible, broil, bake, or roast meats, and steam vegetables. Instead of putting butter or margarine on vegetables, use lemon and herbs, applesauce, and cinnamon (for squash and sweet potatoes). Use nonfat yogurt, salsa, and low-fat dressings for salads.   LOW-SATURATED FAT / LOW-FAT FOOD SUBSTITUTES Meats / Saturated Fat (g)  Avoid: Steak, marbled (3 oz/85 g) / 11 g  Choose: Steak, lean (3 oz/85 g) / 4 g  Avoid: Hamburger (3 oz/85 g) / 7 g  Choose: Hamburger, lean (3 oz/85 g) / 5 g  Avoid: Ham (3 oz/85 g) / 6 g  Choose: Ham, lean cut (3 oz/85 g) / 2.4 g  Avoid: Chicken, with skin, dark meat (3 oz/85 g) / 4 g  Choose: Chicken, skin removed, dark meat (3 oz/85 g) / 2 g  Avoid: Chicken, with skin, light meat (3 oz/85 g) / 2.5 g  Choose: Chicken, skin removed, light meat (3 oz/85 g) / 1 g Dairy / Saturated Fat (g)  Avoid: Whole milk (1 cup) / 5 g  Choose: Low-fat milk, 2% (1 cup) / 3 g  Choose: Low-fat milk, 1% (1 cup) / 1.5 g  Choose: Skim milk (1 cup) / 0.3 g  Avoid: Hard cheese (1 oz/28 g) / 6 g  Choose: Skim milk cheese (1 oz/28 g) / 2 to 3 g  Avoid: Cottage cheese, 4% fat (1 cup) / 6.5 g  Choose: Low-fat cottage cheese, 1% fat (1 cup) / 1.5 g  Avoid: Ice cream (1 cup) / 9 g  Choose: Sherbet (1 cup) / 2.5 g  Choose: Nonfat frozen yogurt (1 cup) / 0.3 g  Choose: Frozen fruit bar / trace  Avoid: Whipped cream (1 tbs) / 3.5 g  Choose: Nondairy whipped topping (1 tbs) / 1 g Condiments / Saturated Fat (g)  Avoid: Mayonnaise (1 tbs) / 2 g  Choose: Low-fat mayonnaise (1 tbs) / 1 g  Avoid: Butter (1 tbs) / 7 g  Choose: Extra light margarine (1 tbs) / 1 g  Avoid: Coconut oil (1 tbs) / 11.8 g  Choose: Olive oil (1 tbs) / 1.8 g  Choose: Corn oil (1 tbs) / 1.7 g  Choose: Safflower oil (1 tbs) / 1.2 g  Choose: Sunflower oil (1 tbs) / 1.4 g  Choose: Soybean oil (1 tbs) / 2.4 g  Choose: Canola oil (1 tbs) / 1 g Document Released: 05/23/2005 Document Revised: 09/17/2012 Document Reviewed: 11/11/2010 [employer] Patient Information 2014 [place], [place].

## 2013-07-15 NOTE — Assessment & Plan Note (Signed)
Hypothyroidism  Pt has no clinical changes No change in energy level/ hair or skin/ edema and no tremor Lab Results  Component Value Date   TSH 3.51 07/08/2013

## 2013-07-15 NOTE — Assessment & Plan Note (Signed)
Disc goals for lipids and reasons to control them Rev labs with pt Rev low sat fat diet in detail LDL is not at goal- but in light of chronic liver issues I do not want to start a statin  Given handout on low chol diet  Will continue to follow

## 2013-07-15 NOTE — Assessment & Plan Note (Signed)
BP: 126/84 mmHg  bp in fair control at this time  No changes needed Disc lifstyle change with low sodium diet and exercise   Labs reviewed

## 2013-07-15 NOTE — Assessment & Plan Note (Signed)
Discussed how this problem influences overall health and the risks it imposes  Reviewed plan for weight loss with lower calorie diet (via better food choices and also portion control or program like weight watchers) and exercise building up to or more than 30 minutes 5 days per week including some aerobic activity    

## 2013-07-15 NOTE — Progress Notes (Signed)
Subjective:    Patient ID: Selena Johnson, female    DOB: [birth year], 63 y.o.   MRN: [id number]  HPI Here for health maintenance exam and to review chronic medical problems    Doing well for the most part   Wt is up 2 lb with bmi of 36 Doing well with her exercise - proud of that (uses a walking video in the winter)  Working on her diet - watches what she eats and cutting back on portions    Pap nl 09 Had a hysterectomy   Flu vaccine - got one in the fall  Td 5/08 Zoster vaccine 5/11 Pneumonia vaccine 5/ 14   colonosc 10/07  Mammogram 4/14 - due in April -she will schedule own appt  Self exam -no changes   bp is stable today  No cp or palpitations or headaches or edema  No side effects to medicines  BP Readings from Last 3 Encounters:  07/15/13 126/84  05/21/13 110/68  01/09/13 126/80      Hyperlipidemia Lab Results  Component Value Date   CHOL 224* 07/08/2013   CHOL 213* 01/02/2013   CHOL 213* 07/06/2012   Lab Results  Component Value Date   HDL 54.50 07/08/2013   HDL 46.60 01/02/2013   HDL 47.90 07/06/2012   Lab Results  Component Value Date   LDLCALC 122* 11/05/2010   LDLCALC 132* 10/24/2007   LDLCALC 124* 01/26/2007   Lab Results  Component Value Date   TRIG 107.0 07/08/2013   TRIG 114.0 01/02/2013   TRIG 133.0 07/06/2012   Lab Results  Component Value Date   CHOLHDL 4 07/08/2013   CHOLHDL 5 01/02/2013   CHOLHDL 4 07/06/2012   Lab Results  Component Value Date   LDLDIRECT 150.1 07/08/2013   LDLDIRECT 144.9 01/02/2013   LDLDIRECT 139.8 07/06/2012   want to avoid cholesterol med due to hx of cirrhosis  Thinks she can eat a bit better  She tries to stay away from high cholesterol foods    Labs: Results for orders placed in visit on 07/08/13  CBC WITH DIFFERENTIAL      Result Value Range   WBC 5.9  4.5 - 10.5 K/uL   RBC 4.41  3.87 - 5.11 Mil/uL   Hemoglobin 14.9  12.0 - 15.0 g/dL   HCT 44.8  36.0 - 46.0 %   MCV 101.6 (*) 78.0 - 100.0 fl   MCHC 33.3  30.0  - 36.0 g/dL   RDW 13.1  11.5 - 14.6 %   Platelets 88.0 (*) 150.0 - 400.0 K/uL   Neutrophils Relative % 54.3  43.0 - 77.0 %   Lymphocytes Relative 36.6  12.0 - 46.0 %   Monocytes Relative 5.1  3.0 - 12.0 %   Eosinophils Relative 3.9  0.0 - 5.0 %   Basophils Relative 0.1  0.0 - 3.0 %   Neutro Abs 3.2  1.4 - 7.7 K/uL   Lymphs Abs 2.1  0.7 - 4.0 K/uL   Monocytes Absolute 0.3  0.1 - 1.0 K/uL   Eosinophils Absolute 0.2  0.0 - 0.7 K/uL   Basophils Absolute 0.0  0.0 - 0.1 K/uL  COMPREHENSIVE METABOLIC PANEL      Result Value Range   Sodium 142  135 - 145 mEq/L   Potassium 4.1  3.5 - 5.1 mEq/L   Chloride 106  96 - 112 mEq/L   CO2 28  19 - 32 mEq/L   Glucose, Bld 87  70 - 99  mg/dL   BUN 11  6 - 23 mg/dL   Creatinine, Ser 0.8  0.4 - 1.2 mg/dL   Total Bilirubin 1.1  0.3 - 1.2 mg/dL   Alkaline Phosphatase 88  39 - 117 U/L   AST 41 (*) 0 - 37 U/L   ALT 33  0 - 35 U/L   Total Protein 7.3  6.0 - 8.3 g/dL   Albumin 4.2  3.5 - 5.2 g/dL   Calcium 9.7  8.4 - 10.5 mg/dL   GFR 72.38  >60.00 mL/min  LIPID PANEL      Result Value Range   Cholesterol 224 (*) 0 - 200 mg/dL   Triglycerides 107.0  0.0 - 149.0 mg/dL   HDL 54.50  >39.00 mg/dL   VLDL 21.4  0.0 - 40.0 mg/dL   Total CHOL/HDL Ratio 4    TSH      Result Value Range   TSH 3.51  0.35 - 5.50 uIU/mL  LDL CHOLESTEROL, DIRECT      Result Value Range   Direct LDL 150.1        Patient Active Problem List   Diagnosis Date Noted  . Dysuria 05/29/2013  . UTI (urinary tract infection) 05/21/2013  . Unspecified constipation 10/25/2012  . Cirrhosis of liver without mention of alcohol 10/23/2012  . Abdominal pain, unspecified site 10/17/2012  . Hypokalemia 07/13/2012  . Thrombocytopenia 11/10/2010  . Routine general medical examination at a health care facility 11/04/2010  . HYPERLIPIDEMIA 03/18/2008  . OBESITY, UNSPECIFIED 03/18/2008  . TRANSAMINASES, SERUM, ELEVATED 03/18/2008  . MENOPAUSE-RELATED VASOMOTOR SYMPTOMS, HOT FLASHES  10/23/2006  . HYPOTHYROIDISM 10/11/2006  . GLAUCOMA 10/11/2006  . HYPERTENSION 10/11/2006  . GERD 10/11/2006  . RENAL CALCULUS 10/11/2006   Past Medical History  Diagnosis Date  . GERD (gastroesophageal reflux disease)   . Hypertension   . Hyperthyroidism   . Menopausal syndrome   . Conjunctivitis     chronic  . Glaucoma   . Allergy   . Cancer     basal cell skin CA  . Hyperlipidemia     no per pt  . Chronic kidney disease     hx of kidney stones   Past Surgical History  Procedure Laterality Date  . Cholecystectomy    . Abdominal hysterectomy      partial ? prolapse  . Tonsillectomy    . Esophagogastroduodenoscopy     History  Substance Use Topics  . Smoking status: Former Research scientist (life sciences)  . Smokeless tobacco: Former Systems developer  . Alcohol Use: No   Family History  Problem Relation Age of Onset  . Heart disease Mother     CAD  . Hypertension Mother   . Glaucoma Mother   . Heart disease Father     CAD  . Diabetes Father   . Hypertension Father   . Cancer Father     ? CA  . Colon cancer Neg Hx   . Esophageal cancer Neg Hx   . Rectal cancer Neg Hx   . Stomach cancer Neg Hx    Allergies  Allergen Reactions  . Codeine     REACTION: Throat swelling  . Ivp Dye [Iodinated Diagnostic Agents] Swelling   Current Outpatient Prescriptions on File Prior to Visit  Medication Sig Dispense Refill  . Biotin 2500 MCG CAPS Take 1 capsule by mouth daily.        . Calcium Carbonate-Vitamin D 600-400 MG-UNIT per tablet Take 1 tablet by mouth daily.       Marland Kitchen  ciprofloxacin (CIPRO) 250 MG tablet Take 1 tablet (250 mg total) by mouth 2 (two) times daily.  14 tablet  0  . latanoprost (XALATAN) 0.005 % ophthalmic solution Place 1 drop into both eyes at bedtime.        Marland Kitchen levothyroxine (SYNTHROID, LEVOTHROID) 150 MCG tablet TAKE ONE TABLET BY MOUTH EVERY DAY  90 tablet  0  . loratadine (CLARITIN) 10 MG tablet Take 10 mg by mouth daily as needed.        . metoprolol succinate (TOPROL-XL) 100 MG  24 hr tablet TAKE ONE TABLET BY MOUTH EVERY DAY *TAKE WITH OR IMMEDIATELY FOLLOWING A MEAL*  90 tablet  0  . Multiple Vitamin (MULTIVITAMIN) capsule Take 1 capsule by mouth daily.        Marland Kitchen triamterene-hydrochlorothiazide (MAXZIDE-25) 37.5-25 MG per tablet TAKE ONE TABLET BY MOUTH EVERY DAY  90 tablet  0   No current facility-administered medications on file prior to visit.    Review of Systems Review of Systems  Constitutional: Negative for fever, appetite change, fatigue and unexpected weight change.  Eyes: Negative for pain and visual disturbance.  Respiratory: Negative for cough and shortness of breath.   Cardiovascular: Negative for cp or palpitations    Gastrointestinal: Negative for nausea, diarrhea and constipation.  Genitourinary: Negative for urgency and frequency.  Skin: Negative for pallor or rash   Neurological: Negative for weakness, light-headedness, numbness and headaches.  Hematological: Negative for adenopathy. Does not bruise/bleed easily.  Psychiatric/Behavioral: Negative for dysphoric mood. The patient is not nervous/anxious.         Objective:   Physical Exam  Constitutional: She appears well-developed and well-nourished. No distress.  obese and well appearing   HENT:  Head: Normocephalic and atraumatic.  Right Ear: External ear normal.  Left Ear: External ear normal.  Mouth/Throat: Oropharynx is clear and moist.  Eyes: EOM are normal. Pupils are equal, round, and reactive to light. No scleral icterus.  Baseline injected conj   Neck: Normal range of motion. Neck supple. No JVD present. Carotid bruit is not present. No thyromegaly present.  Cardiovascular: Normal rate, regular rhythm, normal heart sounds and intact distal pulses.  Exam reveals no gallop.   Pulmonary/Chest: Effort normal and breath sounds normal. No respiratory distress. She has no wheezes. She exhibits no tenderness.  Abdominal: Soft. Bowel sounds are normal. She exhibits no distension, no  abdominal bruit and no mass. There is no tenderness.  Genitourinary: No breast swelling, tenderness, discharge or bleeding.  Breast exam: No mass, nodules, thickening, tenderness, bulging, retraction, inflamation, nipple discharge or skin changes noted.  No axillary or clavicular LA.    Musculoskeletal: Normal range of motion. She exhibits no edema and no tenderness.  Lymphadenopathy:    She has no cervical adenopathy.  Neurological: She is alert. She has normal reflexes. No cranial nerve deficit. She exhibits normal muscle tone. Coordination normal.  Skin: Skin is warm and dry. No rash noted. No erythema. No pallor.  Many spider veins in ankles and feet/ baseline Ruddy complexion  Psychiatric: She has a normal mood and affect.          Assessment & Plan:

## 2013-07-15 NOTE — Progress Notes (Signed)
Pre-visit discussion using our clinic review tool. No additional management support is needed unless otherwise documented below in the visit note.  

## 2013-07-16 ENCOUNTER — Telehealth: Payer: Self-pay | Admitting: Family Medicine

## 2013-07-16 NOTE — Telephone Encounter (Signed)
Relevant patient education assigned to patient using Emmi. ° °

## 2013-09-19 ENCOUNTER — Other Ambulatory Visit: Payer: Self-pay | Admitting: Family Medicine

## 2013-09-20 ENCOUNTER — Encounter: Payer: Self-pay | Admitting: Family Medicine

## 2013-10-29 ENCOUNTER — Ambulatory Visit (INDEPENDENT_AMBULATORY_CARE_PROVIDER_SITE_OTHER): Payer: Medicare Other | Admitting: Gastroenterology

## 2013-10-29 DIAGNOSIS — Z23 Encounter for immunization: Secondary | ICD-10-CM | POA: Diagnosis not present

## 2013-10-29 DIAGNOSIS — K5900 Constipation, unspecified: Secondary | ICD-10-CM

## 2013-10-29 DIAGNOSIS — R109 Unspecified abdominal pain: Secondary | ICD-10-CM

## 2013-10-29 DIAGNOSIS — R748 Abnormal levels of other serum enzymes: Secondary | ICD-10-CM

## 2013-11-26 DIAGNOSIS — H4010X0 Unspecified open-angle glaucoma, stage unspecified: Secondary | ICD-10-CM | POA: Diagnosis not present

## 2014-01-07 DIAGNOSIS — L821 Other seborrheic keratosis: Secondary | ICD-10-CM | POA: Diagnosis not present

## 2014-01-07 DIAGNOSIS — D235 Other benign neoplasm of skin of trunk: Secondary | ICD-10-CM | POA: Diagnosis not present

## 2014-01-07 DIAGNOSIS — D1801 Hemangioma of skin and subcutaneous tissue: Secondary | ICD-10-CM | POA: Diagnosis not present

## 2014-03-07 ENCOUNTER — Other Ambulatory Visit: Payer: Self-pay | Admitting: Family Medicine

## 2014-03-10 DIAGNOSIS — Z23 Encounter for immunization: Secondary | ICD-10-CM | POA: Diagnosis not present

## 2014-03-18 DIAGNOSIS — M6740 Ganglion, unspecified site: Secondary | ICD-10-CM | POA: Diagnosis not present

## 2014-03-18 DIAGNOSIS — D485 Neoplasm of uncertain behavior of skin: Secondary | ICD-10-CM | POA: Diagnosis not present

## 2014-03-21 ENCOUNTER — Other Ambulatory Visit: Payer: Self-pay

## 2014-05-26 DIAGNOSIS — H4010X2 Unspecified open-angle glaucoma, moderate stage: Secondary | ICD-10-CM | POA: Diagnosis not present

## 2014-07-07 ENCOUNTER — Telehealth: Payer: Self-pay | Admitting: Family Medicine

## 2014-07-07 DIAGNOSIS — E039 Hypothyroidism, unspecified: Secondary | ICD-10-CM

## 2014-07-07 DIAGNOSIS — I10 Essential (primary) hypertension: Secondary | ICD-10-CM

## 2014-07-07 DIAGNOSIS — E785 Hyperlipidemia, unspecified: Secondary | ICD-10-CM

## 2014-07-07 NOTE — Telephone Encounter (Signed)
-----   Message from Ellamae Sia sent at 07/03/2014  4:24 PM EST ----- Regarding: Lab orders for Wednesday, 2.3.16 Patient is scheduled for CPX labs, please order future labs, Thanks , Karna Christmas

## 2014-07-09 ENCOUNTER — Other Ambulatory Visit (INDEPENDENT_AMBULATORY_CARE_PROVIDER_SITE_OTHER): Payer: Medicare Other

## 2014-07-09 DIAGNOSIS — I10 Essential (primary) hypertension: Secondary | ICD-10-CM

## 2014-07-09 DIAGNOSIS — E785 Hyperlipidemia, unspecified: Secondary | ICD-10-CM | POA: Diagnosis not present

## 2014-07-09 DIAGNOSIS — E039 Hypothyroidism, unspecified: Secondary | ICD-10-CM | POA: Diagnosis not present

## 2014-07-09 LAB — CBC WITH DIFFERENTIAL/PLATELET
Basophils Absolute: 0 10*3/uL (ref 0.0–0.1)
Basophils Relative: 0.4 % (ref 0.0–3.0)
Eosinophils Absolute: 0.2 10*3/uL (ref 0.0–0.7)
Eosinophils Relative: 5 % (ref 0.0–5.0)
HCT: 43.4 % (ref 36.0–46.0)
Hemoglobin: 15.3 g/dL — ABNORMAL HIGH (ref 12.0–15.0)
LYMPHS PCT: 29.1 % (ref 12.0–46.0)
Lymphs Abs: 1.4 10*3/uL (ref 0.7–4.0)
MCHC: 35.3 g/dL (ref 30.0–36.0)
MCV: 97.8 fl (ref 78.0–100.0)
Monocytes Absolute: 0.5 10*3/uL (ref 0.1–1.0)
Monocytes Relative: 9.4 % (ref 3.0–12.0)
NEUTROS PCT: 56.1 % (ref 43.0–77.0)
Neutro Abs: 2.8 10*3/uL (ref 1.4–7.7)
Platelets: 87 10*3/uL — ABNORMAL LOW (ref 150.0–400.0)
RBC: 4.44 Mil/uL (ref 3.87–5.11)
RDW: 12.8 % (ref 11.5–15.5)
WBC: 5 10*3/uL (ref 4.0–10.5)

## 2014-07-09 LAB — COMPREHENSIVE METABOLIC PANEL
ALK PHOS: 77 U/L (ref 39–117)
ALT: 40 U/L — ABNORMAL HIGH (ref 0–35)
AST: 50 U/L — ABNORMAL HIGH (ref 0–37)
Albumin: 4.1 g/dL (ref 3.5–5.2)
BUN: 11 mg/dL (ref 6–23)
CALCIUM: 9.5 mg/dL (ref 8.4–10.5)
CHLORIDE: 105 meq/L (ref 96–112)
CO2: 28 mEq/L (ref 19–32)
CREATININE: 0.75 mg/dL (ref 0.40–1.20)
GFR: 82.23 mL/min (ref >60.00)
Glucose, Bld: 93 mg/dL (ref 70–99)
POTASSIUM: 3.5 meq/L (ref 3.5–5.1)
SODIUM: 140 meq/L (ref 135–145)
Total Bilirubin: 1.4 mg/dL — ABNORMAL HIGH (ref 0.2–1.2)
Total Protein: 6.7 g/dL (ref 6.0–8.3)

## 2014-07-09 LAB — LIPID PANEL
CHOL/HDL RATIO: 4
CHOLESTEROL: 181 mg/dL (ref 0–200)
HDL: 48.6 mg/dL (ref >39.00)
LDL CALC: 112 mg/dL — AB (ref 0–99)
NONHDL: 132.4
Triglycerides: 100 mg/dL (ref 0.0–149.0)
VLDL: 20 mg/dL (ref 0.0–40.0)

## 2014-07-09 LAB — TSH: TSH: 3.15 u[IU]/mL (ref 0.35–4.50)

## 2014-07-18 ENCOUNTER — Ambulatory Visit (INDEPENDENT_AMBULATORY_CARE_PROVIDER_SITE_OTHER): Payer: Medicare Other | Admitting: Family Medicine

## 2014-07-18 ENCOUNTER — Encounter: Payer: Self-pay | Admitting: Family Medicine

## 2014-07-18 VITALS — BP 130/62 | HR 69 | Temp 98.1°F | Ht 66.0 in | Wt 223.8 lb

## 2014-07-18 DIAGNOSIS — Z0000 Encounter for general adult medical examination without abnormal findings: Secondary | ICD-10-CM | POA: Diagnosis not present

## 2014-07-18 DIAGNOSIS — E039 Hypothyroidism, unspecified: Secondary | ICD-10-CM | POA: Diagnosis not present

## 2014-07-18 DIAGNOSIS — Z23 Encounter for immunization: Secondary | ICD-10-CM

## 2014-07-18 DIAGNOSIS — E669 Obesity, unspecified: Secondary | ICD-10-CM | POA: Diagnosis not present

## 2014-07-18 DIAGNOSIS — R7401 Elevation of levels of liver transaminase levels: Secondary | ICD-10-CM

## 2014-07-18 DIAGNOSIS — I10 Essential (primary) hypertension: Secondary | ICD-10-CM

## 2014-07-18 DIAGNOSIS — R740 Nonspecific elevation of levels of transaminase and lactic acid dehydrogenase [LDH]: Secondary | ICD-10-CM | POA: Diagnosis not present

## 2014-07-18 DIAGNOSIS — E785 Hyperlipidemia, unspecified: Secondary | ICD-10-CM | POA: Diagnosis not present

## 2014-07-18 MED ORDER — METOPROLOL SUCCINATE ER 100 MG PO TB24: ORAL_TABLET | ORAL | Status: DC

## 2014-07-18 MED ORDER — TRIAMTERENE-HCTZ 37.5-25 MG PO TABS: 1.0000 | ORAL_TABLET | Freq: Every day | ORAL | Status: DC

## 2014-07-18 MED ORDER — LEVOTHYROXINE SODIUM 150 MCG PO TABS
150.0000 ug | ORAL_TABLET | Freq: Every day | ORAL | Status: DC
Start: 2014-07-18 — End: 2015-07-21

## 2014-07-18 MED ORDER — POTASSIUM CHLORIDE CRYS ER 20 MEQ PO TBCR: EXTENDED_RELEASE_TABLET | ORAL | Status: DC

## 2014-07-18 MED ORDER — PANTOPRAZOLE SODIUM 40 MG PO TBEC: 40.0000 mg | DELAYED_RELEASE_TABLET | Freq: Every day | ORAL | Status: DC

## 2014-07-18 NOTE — Patient Instructions (Signed)
Don't forget to schedule your mammogram in the spring prevnar vaccine today   Work on healthy diet and exercise for weight loss (to also help liver tests)

## 2014-07-18 NOTE — Progress Notes (Signed)
Pre visit review using our clinic review tool, if applicable. No additional management support is needed unless otherwise documented below in the visit note. 

## 2014-07-18 NOTE — Progress Notes (Signed)
Subjective:    Patient ID: Selena Johnson, female    DOB: [birth year], 64 y.o.   MRN: [id number]  HPI Here for annual medicare wellness visit as well as chronic/acute medical problems   I have personally reviewed the Medicare Annual Wellness questionnaire and have noted 1. The patient's medical and social history 2. Their use of alcohol, tobacco or illicit drugs 3. Their current medications and supplements 4. The patient's functional ability including ADL's, fall risks, home safety risks and hearing or visual             impairment. 5. Diet and physical activities 6. Evidence for depression or mood disorders  The patients weight, height, BMI have been recorded in the chart and visual acuity is per eye clinic.  I have made referrals, counseling and provided education to the patient based review of the above and I have provided the pt with a written personalized care plan for preventive services.  Can't complain  Doing well  Has had a cold for 3 weeks- starting to get better   See scanned forms.  Routine anticipatory guidance given to patient.  See health maintenance. Colon cancer screening 10/07 colonosc 10 year recall  Breast cancer screening 4/15 normal mammogram - she will make her own appt at [place] breast exam - no lumps  Flu vaccine- had in fall at [facility]  Tetanus vaccine 5/08  Pneumovax 5/14 - will do prevnar today Zoster vaccine 5/11   Advance directive - has one drawn up already  Cognitive function addressed- see scanned forms- and if abnormal then additional documentation follows. No significant problems    PMH and SH reviewed  Meds, vitals, and allergies reviewed.   ROS: See HPI.  Otherwise negative.     Wt is stable with bmi of 36  BP Readings from Last 3 Encounters:  07/18/14 130/62  07/15/13 126/84  05/21/13 110/68     Hx of fatty liver Lab Results  Component Value Date   ALT 40* 07/09/2014   AST 50* 07/09/2014   ALKPHOS 77 07/09/2014   BILITOT 1.4* 07/09/2014   she was exercising up until she got her cold  Knows she needs to loose wt  Walks/ videos most of the time Ate poorly a superbowl party     Cholesterol Lab Results  Component Value Date   CHOL 181 07/09/2014   CHOL 224* 07/08/2013   CHOL 213* 01/02/2013   Lab Results  Component Value Date   HDL 48.60 07/09/2014   HDL 54.50 07/08/2013   HDL 46.60 01/02/2013   Lab Results  Component Value Date   LDLCALC 112* 07/09/2014   LDLCALC 122* 11/05/2010   LDLCALC 132* 10/24/2007   Lab Results  Component Value Date   TRIG 100.0 07/09/2014   TRIG 107.0 07/08/2013   TRIG 114.0 01/02/2013   Lab Results  Component Value Date   CHOLHDL 4 07/09/2014   CHOLHDL 4 07/08/2013   CHOLHDL 5 01/02/2013   Lab Results  Component Value Date   LDLDIRECT 150.1 07/08/2013   LDLDIRECT 144.9 01/02/2013   LDLDIRECT 139.8 07/06/2012  overall improved LDL       Hypothyroidism  Pt has no clinical changes No change in energy level/ hair or skin/ edema and no tremor Lab Results  Component Value Date   TSH 3.15 07/09/2014       Chemistry      Component Value Date/Time   NA 140 07/09/2014 0901   K 3.5 07/09/2014 0901  CL 105 07/09/2014 0901   CO2 28 07/09/2014 0901   BUN 11 07/09/2014 0901   CREATININE 0.75 07/09/2014 0901      Component Value Date/Time   CALCIUM 9.5 07/09/2014 0901   ALKPHOS 77 07/09/2014 0901   AST 50* 07/09/2014 0901   ALT 40* 07/09/2014 0901   BILITOT 1.4* 07/09/2014 0901       Patient Active Problem List   Diagnosis Date Noted  . Unspecified constipation 10/25/2012  . Cirrhosis of liver without mention of alcohol 10/23/2012  . Hypokalemia 07/13/2012  . Routine general medical examination at a health care facility 11/04/2010  . Hyperlipidemia 03/18/2008  . OBESITY, UNSPECIFIED 03/18/2008  . TRANSAMINASES, SERUM, ELEVATED 03/18/2008  . MENOPAUSE-RELATED VASOMOTOR SYMPTOMS, HOT FLASHES 10/23/2006  . Hypothyroidism 10/11/2006  .  GLAUCOMA 10/11/2006  . Essential hypertension 10/11/2006  . GERD 10/11/2006  . History of kidney stones 10/11/2006   Past Medical History  Diagnosis Date  . GERD (gastroesophageal reflux disease)   . Hypertension   . Hyperthyroidism   . Menopausal syndrome   . Conjunctivitis     chronic  . Glaucoma   . Allergy   . Cancer     basal cell skin CA  . Hyperlipidemia     no per pt  . Chronic kidney disease     hx of kidney stones   Past Surgical History  Procedure Laterality Date  . Cholecystectomy    . Abdominal hysterectomy      partial ? prolapse  . Tonsillectomy    . Esophagogastroduodenoscopy     History  Substance Use Topics  . Smoking status: Former Research scientist (life sciences)  . Smokeless tobacco: Former Systems developer  . Alcohol Use: No   Family History  Problem Relation Age of Onset  . Heart disease Mother     CAD  . Hypertension Mother   . Glaucoma Mother   . Heart disease Father     CAD  . Diabetes Father   . Hypertension Father   . Cancer Father     ? CA  . Colon cancer Neg Hx   . Esophageal cancer Neg Hx   . Rectal cancer Neg Hx   . Stomach cancer Neg Hx    Allergies  Allergen Reactions  . Codeine     REACTION: Throat swelling  . Ivp Dye [Iodinated Diagnostic Agents] Swelling   Current Outpatient Prescriptions on File Prior to Visit  Medication Sig Dispense Refill  . Biotin 2500 MCG CAPS Take 1 capsule by mouth daily.      . Calcium Carbonate-Vitamin D 600-400 MG-UNIT per tablet Take 1 tablet by mouth daily.     Marland Kitchen latanoprost (XALATAN) 0.005 % ophthalmic solution Place 1 drop into both eyes at bedtime.      Marland Kitchen levothyroxine (SYNTHROID, LEVOTHROID) 150 MCG tablet TAKE 1 TABLET BY MOUTH EVERY DAY 90 tablet 1  . loratadine (CLARITIN) 10 MG tablet Take 10 mg by mouth daily as needed.      . metoprolol succinate (TOPROL-XL) 100 MG 24 hr tablet TAKE 1 TABLET BY MOUTH EVERY DAY WITH A MEAL 90 tablet 1  . Multiple Vitamin (MULTIVITAMIN) capsule Take 1 capsule by mouth daily.        . pantoprazole (PROTONIX) 40 MG tablet Take 1 tablet (40 mg total) by mouth daily. 90 tablet 3  . potassium chloride SA (KLOR-CON M20) 20 MEQ tablet TAKE ONE TABLET BY MOUTH EVERY DAY 90 tablet 3  . triamterene-hydrochlorothiazide (MAXZIDE-25) 37.5-25 MG per tablet TAKE 1  TABLET BY MOUTH EVERY DAY 90 tablet 1   No current facility-administered medications on file prior to visit.     Review of Systems     Objective:   Physical Exam  Constitutional: She appears well-developed and well-nourished. No distress.  HENT:  Head: Normocephalic and atraumatic.  Right Ear: External ear normal.  Left Ear: External ear normal.  Mouth/Throat: Oropharynx is clear and moist.  Eyes: EOM are normal. Pupils are equal, round, and reactive to light. No scleral icterus.  Baseline infected conj  Neck: Normal range of motion. Neck supple. No JVD present. Carotid bruit is not present. No thyromegaly present.  Cardiovascular: Normal rate, regular rhythm, normal heart sounds and intact distal pulses.  Exam reveals no gallop.   Pulmonary/Chest: Effort normal and breath sounds normal. No respiratory distress. She has no wheezes. She exhibits no tenderness.  Abdominal: Soft. Bowel sounds are normal. She exhibits no distension, no abdominal bruit and no mass. There is no tenderness.  Genitourinary: No breast swelling, tenderness, discharge or bleeding.  Breast exam: No mass, nodules, thickening, tenderness, bulging, retraction, inflamation, nipple discharge or skin changes noted.  No axillary or clavicular LA.      Musculoskeletal: Normal range of motion. She exhibits no edema or tenderness.  Lymphadenopathy:    She has no cervical adenopathy.  Neurological: She is alert. She has normal reflexes. No cranial nerve deficit. She exhibits normal muscle tone. Coordination normal.  Skin: Skin is warm and dry. No rash noted. No erythema. No pallor.  Psychiatric: She has a normal mood and affect.          Assessment  & Plan:   Problem List Items Addressed This Visit      Cardiovascular and Mediastinum   Essential hypertension    bp in fair control at this time  BP Readings from Last 1 Encounters:  07/18/14 130/62   No changes needed Disc lifstyle change with low sodium diet and exercise  Lab reviewed        Relevant Medications   metoprolol succinate (TOPROL-XL) 24 hr tablet   triamterene-hydrochlorothiazide (MAXZIDE-25) 37.5-25 MG per tablet     Endocrine   Hypothyroidism    Hypothyroidism  Pt has no clinical changes No change in energy level/ hair or skin/ edema and no tremor Lab Results  Component Value Date   TSH 3.15 07/09/2014          Relevant Medications   levothyroxine (SYNTHROID, LEVOTHROID) tablet   metoprolol succinate (TOPROL-XL) 24 hr tablet     Other   Encounter for Medicare annual wellness exam    Reviewed health habits including diet and exercise and skin cancer prevention Reviewed appropriate screening tests for age  Also reviewed health mt list, fam hx and immunization status , as well as social and family history    See HPI Labs reviewed  prevnar vaccine today  She will schedule her own mammogram       Hyperlipidemia    Disc goals for lipids and reasons to control them Rev labs with pt Rev low sat fat diet in detail       Relevant Medications   metoprolol succinate (TOPROL-XL) 24 hr tablet   triamterene-hydrochlorothiazide (MAXZIDE-25) 37.5-25 MG per tablet   Obesity    Discussed how this problem influences overall health and the risks it imposes  Reviewed plan for weight loss with lower calorie diet (via better food choices and also portion control or program like weight watchers) and exercise building up to  or more than 30 minutes 5 days per week including some aerobic activity         TRANSAMINASES, SERUM, ELEVATED    Lab Results  Component Value Date   ALT 40* 07/09/2014   AST 50* 07/09/2014   ALKPHOS 77 07/09/2014   BILITOT 1.4*  07/09/2014   This is fairly stable Again disc imp of wt loss and low fat diet for fatty liver        Other Visit Diagnoses    Need for pneumococcal vaccination    -  Primary    Relevant Orders    Pneumococcal conjugate vaccine 13-valent IM (Completed)

## 2014-07-20 DIAGNOSIS — Z0000 Encounter for general adult medical examination without abnormal findings: Secondary | ICD-10-CM | POA: Insufficient documentation

## 2014-07-20 NOTE — Assessment & Plan Note (Signed)
Discussed how this problem influences overall health and the risks it imposes  Reviewed plan for weight loss with lower calorie diet (via better food choices and also portion control or program like weight watchers) and exercise building up to or more than 30 minutes 5 days per week including some aerobic activity    

## 2014-07-20 NOTE — Assessment & Plan Note (Signed)
Hypothyroidism  Pt has no clinical changes No change in energy level/ hair or skin/ edema and no tremor Lab Results  Component Value Date   TSH 3.15 07/09/2014

## 2014-07-20 NOTE — Assessment & Plan Note (Signed)
bp in fair control at this time  BP Readings from Last 1 Encounters:  07/18/14 130/62   No changes needed Disc lifstyle change with low sodium diet and exercise  Lab reviewed

## 2014-07-20 NOTE — Assessment & Plan Note (Signed)
Reviewed health habits including diet and exercise and skin cancer prevention Reviewed appropriate screening tests for age  Also reviewed health mt list, fam hx and immunization status , as well as social and family history    See HPI Labs reviewed  prevnar vaccine today  She will schedule her own mammogram

## 2014-07-20 NOTE — Assessment & Plan Note (Signed)
Disc goals for lipids and reasons to control them Rev labs with pt Rev low sat fat diet in detail   

## 2014-07-20 NOTE — Assessment & Plan Note (Signed)
Lab Results  Component Value Date   ALT 40* 07/09/2014   AST 50* 07/09/2014   ALKPHOS 77 07/09/2014   BILITOT 1.4* 07/09/2014   This is fairly stable Again disc imp of wt loss and low fat diet for fatty liver

## 2014-08-21 NOTE — Addendum Note (Signed)
Addended by: Loura Pardon A on: 08/21/2014 07:37 PM   Modules accepted: Level of Service

## 2014-10-02 DIAGNOSIS — Z1231 Encounter for screening mammogram for malignant neoplasm of breast: Secondary | ICD-10-CM | POA: Diagnosis not present

## 2014-10-03 ENCOUNTER — Encounter: Payer: Self-pay | Admitting: Family Medicine

## 2014-10-08 DIAGNOSIS — R922 Inconclusive mammogram: Secondary | ICD-10-CM | POA: Diagnosis not present

## 2014-10-08 DIAGNOSIS — R928 Other abnormal and inconclusive findings on diagnostic imaging of breast: Secondary | ICD-10-CM | POA: Diagnosis not present

## 2014-10-09 ENCOUNTER — Encounter: Payer: Self-pay | Admitting: Family Medicine

## 2014-10-14 ENCOUNTER — Other Ambulatory Visit: Payer: Self-pay | Admitting: Radiology

## 2014-10-14 DIAGNOSIS — N6011 Diffuse cystic mastopathy of right breast: Secondary | ICD-10-CM | POA: Diagnosis not present

## 2014-10-14 DIAGNOSIS — R921 Mammographic calcification found on diagnostic imaging of breast: Secondary | ICD-10-CM | POA: Diagnosis not present

## 2014-10-14 DIAGNOSIS — Z0000 Encounter for general adult medical examination without abnormal findings: Secondary | ICD-10-CM | POA: Diagnosis not present

## 2014-10-14 DIAGNOSIS — D241 Benign neoplasm of right breast: Secondary | ICD-10-CM | POA: Diagnosis not present

## 2014-10-16 ENCOUNTER — Encounter: Payer: Self-pay | Admitting: Family Medicine

## 2014-11-24 DIAGNOSIS — H4010X2 Unspecified open-angle glaucoma, moderate stage: Secondary | ICD-10-CM | POA: Diagnosis not present

## 2015-03-03 DIAGNOSIS — D485 Neoplasm of uncertain behavior of skin: Secondary | ICD-10-CM | POA: Diagnosis not present

## 2015-03-03 DIAGNOSIS — L821 Other seborrheic keratosis: Secondary | ICD-10-CM | POA: Diagnosis not present

## 2015-03-03 DIAGNOSIS — M6740 Ganglion, unspecified site: Secondary | ICD-10-CM | POA: Diagnosis not present

## 2015-03-03 DIAGNOSIS — D225 Melanocytic nevi of trunk: Secondary | ICD-10-CM | POA: Diagnosis not present

## 2015-03-05 DIAGNOSIS — M67442 Ganglion, left hand: Secondary | ICD-10-CM | POA: Diagnosis not present

## 2015-03-11 ENCOUNTER — Other Ambulatory Visit: Payer: Self-pay | Admitting: Orthopedic Surgery

## 2015-03-11 DIAGNOSIS — M71342 Other bursal cyst, left hand: Secondary | ICD-10-CM | POA: Diagnosis not present

## 2015-03-11 DIAGNOSIS — M19042 Primary osteoarthritis, left hand: Secondary | ICD-10-CM | POA: Diagnosis not present

## 2015-03-11 DIAGNOSIS — M67442 Ganglion, left hand: Secondary | ICD-10-CM | POA: Diagnosis not present

## 2015-04-03 DIAGNOSIS — Z23 Encounter for immunization: Secondary | ICD-10-CM | POA: Diagnosis not present

## 2015-05-05 ENCOUNTER — Encounter: Payer: Self-pay | Admitting: Family Medicine

## 2015-05-06 ENCOUNTER — Telehealth: Payer: Self-pay | Admitting: Family Medicine

## 2015-05-06 DIAGNOSIS — R921 Mammographic calcification found on diagnostic imaging of breast: Secondary | ICD-10-CM | POA: Insufficient documentation

## 2015-05-06 NOTE — Telephone Encounter (Signed)
Orders for breast imaging  See the mychart message  Will route to [facility] [place]

## 2015-05-25 DIAGNOSIS — H4010X2 Unspecified open-angle glaucoma, moderate stage: Secondary | ICD-10-CM | POA: Diagnosis not present

## 2015-06-09 DIAGNOSIS — R928 Other abnormal and inconclusive findings on diagnostic imaging of breast: Secondary | ICD-10-CM | POA: Diagnosis not present

## 2015-06-09 DIAGNOSIS — R921 Mammographic calcification found on diagnostic imaging of breast: Secondary | ICD-10-CM | POA: Diagnosis not present

## 2015-06-15 ENCOUNTER — Other Ambulatory Visit: Payer: Self-pay | Admitting: Radiology

## 2015-06-15 ENCOUNTER — Encounter: Payer: Self-pay | Admitting: Family Medicine

## 2015-06-15 DIAGNOSIS — N6011 Diffuse cystic mastopathy of right breast: Secondary | ICD-10-CM | POA: Diagnosis not present

## 2015-06-15 DIAGNOSIS — R921 Mammographic calcification found on diagnostic imaging of breast: Secondary | ICD-10-CM | POA: Diagnosis not present

## 2015-06-15 DIAGNOSIS — R928 Other abnormal and inconclusive findings on diagnostic imaging of breast: Secondary | ICD-10-CM | POA: Diagnosis not present

## 2015-06-16 ENCOUNTER — Encounter: Payer: Self-pay | Admitting: *Deleted

## 2015-06-25 ENCOUNTER — Encounter: Payer: Self-pay | Admitting: Family Medicine

## 2015-07-01 DIAGNOSIS — H40003 Preglaucoma, unspecified, bilateral: Secondary | ICD-10-CM | POA: Diagnosis not present

## 2015-07-12 ENCOUNTER — Telehealth: Payer: Self-pay | Admitting: Family Medicine

## 2015-07-12 DIAGNOSIS — I10 Essential (primary) hypertension: Secondary | ICD-10-CM

## 2015-07-12 DIAGNOSIS — E785 Hyperlipidemia, unspecified: Secondary | ICD-10-CM

## 2015-07-12 DIAGNOSIS — E039 Hypothyroidism, unspecified: Secondary | ICD-10-CM

## 2015-07-12 NOTE — Telephone Encounter (Signed)
-----   Message from Ellamae Sia sent at 07/09/2015 11:21 AM EST ----- Regarding: Lab orders for Friday, 2.10.17 Patient is scheduled for CPX labs, please order future labs, Thanks , Karna Christmas

## 2015-07-17 ENCOUNTER — Other Ambulatory Visit (INDEPENDENT_AMBULATORY_CARE_PROVIDER_SITE_OTHER): Payer: Medicare Other

## 2015-07-17 DIAGNOSIS — E785 Hyperlipidemia, unspecified: Secondary | ICD-10-CM

## 2015-07-17 DIAGNOSIS — E039 Hypothyroidism, unspecified: Secondary | ICD-10-CM

## 2015-07-17 DIAGNOSIS — I10 Essential (primary) hypertension: Secondary | ICD-10-CM | POA: Diagnosis not present

## 2015-07-17 LAB — COMPREHENSIVE METABOLIC PANEL
ALBUMIN: 4 g/dL (ref 3.5–5.2)
ALK PHOS: 86 U/L (ref 39–117)
ALT: 37 U/L — ABNORMAL HIGH (ref 0–35)
AST: 53 U/L — AB (ref 0–37)
BUN: 8 mg/dL (ref 6–23)
CO2: 31 mEq/L (ref 19–32)
CREATININE: 0.77 mg/dL (ref 0.40–1.20)
Calcium: 9.8 mg/dL (ref 8.4–10.5)
Chloride: 105 mEq/L (ref 96–112)
GFR: 79.53 mL/min (ref >60.00)
GLUCOSE: 99 mg/dL (ref 70–99)
POTASSIUM: 4 meq/L (ref 3.5–5.1)
SODIUM: 142 meq/L (ref 135–145)
TOTAL PROTEIN: 7 g/dL (ref 6.0–8.3)
Total Bilirubin: 1.3 mg/dL — ABNORMAL HIGH (ref 0.2–1.2)

## 2015-07-17 LAB — LIPID PANEL
CHOLESTEROL: 218 mg/dL — AB (ref 0–200)
HDL: 51.6 mg/dL (ref >39.00)
LDL CALC: 146 mg/dL — AB (ref 0–99)
NONHDL: 166.34
Total CHOL/HDL Ratio: 4
Triglycerides: 100 mg/dL (ref 0.0–149.0)
VLDL: 20 mg/dL (ref 0.0–40.0)

## 2015-07-17 LAB — TSH: TSH: 5.35 u[IU]/mL — AB (ref 0.35–4.50)

## 2015-07-18 LAB — CBC WITH DIFFERENTIAL/PLATELET
BASOS ABS: 0 10*3/uL (ref 0.0–0.1)
BASOS PCT: 0.2 % (ref 0.0–3.0)
EOS ABS: 0.2 10*3/uL (ref 0.0–0.7)
EOS PCT: 4.5 % (ref 0.0–5.0)
HCT: 46.4 % — ABNORMAL HIGH (ref 36.0–46.0)
HEMOGLOBIN: 15.4 g/dL — AB (ref 12.0–15.0)
LYMPHS ABS: 1.4 10*3/uL (ref 0.7–4.0)
LYMPHS PCT: 31.8 % (ref 12.0–46.0)
MCHC: 33.2 g/dL (ref 30.0–36.0)
MCV: 104.7 fl — ABNORMAL HIGH (ref 78.0–100.0)
MONO ABS: 0.4 10*3/uL (ref 0.1–1.0)
MONOS PCT: 9.3 % (ref 3.0–12.0)
NEUTROS PCT: 54.2 % (ref 43.0–77.0)
Neutro Abs: 2.4 10*3/uL (ref 1.4–7.7)
Platelets: 70 10*3/uL — ABNORMAL LOW (ref 150.0–400.0)
RBC: 4.43 Mil/uL (ref 3.87–5.11)
RDW: 13.4 % (ref 11.5–15.5)
WBC: 4.4 10*3/uL (ref 4.0–10.5)

## 2015-07-21 ENCOUNTER — Ambulatory Visit (INDEPENDENT_AMBULATORY_CARE_PROVIDER_SITE_OTHER): Payer: Medicare Other | Admitting: Family Medicine

## 2015-07-21 ENCOUNTER — Encounter: Payer: Self-pay | Admitting: Family Medicine

## 2015-07-21 VITALS — BP 124/70 | HR 58 | Temp 97.6°F | Ht 65.5 in | Wt 220.5 lb

## 2015-07-21 DIAGNOSIS — R7401 Elevation of levels of liver transaminase levels: Secondary | ICD-10-CM

## 2015-07-21 DIAGNOSIS — Z0000 Encounter for general adult medical examination without abnormal findings: Secondary | ICD-10-CM | POA: Diagnosis not present

## 2015-07-21 DIAGNOSIS — E785 Hyperlipidemia, unspecified: Secondary | ICD-10-CM | POA: Diagnosis not present

## 2015-07-21 DIAGNOSIS — I10 Essential (primary) hypertension: Secondary | ICD-10-CM

## 2015-07-21 DIAGNOSIS — E2839 Other primary ovarian failure: Secondary | ICD-10-CM

## 2015-07-21 DIAGNOSIS — E669 Obesity, unspecified: Secondary | ICD-10-CM | POA: Diagnosis not present

## 2015-07-21 DIAGNOSIS — R740 Nonspecific elevation of levels of transaminase and lactic acid dehydrogenase [LDH]: Secondary | ICD-10-CM

## 2015-07-21 DIAGNOSIS — E039 Hypothyroidism, unspecified: Secondary | ICD-10-CM | POA: Diagnosis not present

## 2015-07-21 DIAGNOSIS — Z1211 Encounter for screening for malignant neoplasm of colon: Secondary | ICD-10-CM

## 2015-07-21 DIAGNOSIS — R7402 Elevation of levels of lactic acid dehydrogenase (LDH): Secondary | ICD-10-CM

## 2015-07-21 MED ORDER — METOPROLOL SUCCINATE ER 100 MG PO TB24: ORAL_TABLET | ORAL | Status: DC

## 2015-07-21 MED ORDER — PANTOPRAZOLE SODIUM 40 MG PO TBEC: 40.0000 mg | DELAYED_RELEASE_TABLET | Freq: Every day | ORAL | Status: DC

## 2015-07-21 MED ORDER — POTASSIUM CHLORIDE CRYS ER 20 MEQ PO TBCR
EXTENDED_RELEASE_TABLET | ORAL | Status: DC
Start: 2015-07-21 — End: 2016-06-14

## 2015-07-21 MED ORDER — LEVOTHYROXINE SODIUM 175 MCG PO TABS: 175.0000 ug | ORAL_TABLET | Freq: Every day | ORAL | Status: DC

## 2015-07-21 MED ORDER — TRIAMTERENE-HCTZ 37.5-25 MG PO TABS: 1.0000 | ORAL_TABLET | Freq: Every day | ORAL | Status: DC

## 2015-07-21 NOTE — Progress Notes (Signed)
Subjective:    Patient ID: Terese Door, female    DOB: [birth year], 65 y.o.   MRN: [id number]  HPI Here for annual medicare wellness visit as well as chronic/acute medical problems   I have personally reviewed the Medicare Annual Wellness questionnaire and have noted 1. The patient's medical and social history 2. Their use of alcohol, tobacco or illicit drugs 3. Their current medications and supplements 4. The patient's functional ability including ADL's, fall risks, home safety risks and hearing or visual             impairment. 5. Diet and physical activities 6. Evidence for depression or mood disorders  The patients weight, height, BMI have been recorded in the chart and visual acuity is per eye clinic.  I have made referrals, counseling and provided education to the patient based review of the above and I have provided the pt with a written personalized care plan for preventive services. Reviewed and updated provider list, see scanned forms.  See scanned forms.  Routine anticipatory guidance given to patient.  See health maintenance. Colon cancer screening- last was 2007 - is due for that  Breast cancer screening -has had to have 2 breast biopsies - they were neg / fibrocystic change - goal will be 6 mo from Jan -due in July (goes to [facility]) Self breast exam-no new lumps  Flu vaccine had on 10/28  Tetanus vaccine 5/08 Pneumovax 2/16- complete on both  Zoster vaccine 5/11 Has not had a bone density test yet- would like to schedule that  No falls or fractures  She does not take ca or D   (ca inc her kidney stones) Still walks 3-5 times per week (not as good in the winter)  Advance directive - has a living will and POA  Cognitive function addressed- see scanned forms- and if abnormal then additional documentation follows. No major concerns about memory   PMH and SH reviewed  Meds, vitals, and allergies reviewed.   ROS: See HPI.  Otherwise negative.      Hypothyroidism    Pt has no clinical changes No change in energy level/ hair or skin/ edema and no tremor Does not think she missed any doses  Lab Results  Component Value Date   TSH 5.35* 07/17/2015    bp is stable today  No cp or palpitations or headaches or edema  No side effects to medicines  BP Readings from Last 3 Encounters:  07/21/15 124/70  07/18/14 130/62  07/15/13 126/84     Wt is down 3 lb with bmi of 36 Watches what she eats  Making attempt not to eat at night  [facility]  She did not like weight watchers   Hx of NASH/cirrhosis Lab Results  Component Value Date   ALT 37* 07/17/2015   AST 53* 07/17/2015   ALKPHOS 86 07/17/2015   BILITOT 1.3* 07/17/2015   she does drink occ - a glass of wine  No abd pain  Has been watched for esophageal varicies   Lab Results  Component Value Date   WBC 4.4 07/17/2015   HGB 15.4* 07/17/2015   HCT 46.4* 07/17/2015   MCV 104.7* 07/17/2015   PLT 70.0* 07/17/2015    This is fairly stable - usually has mildly elevated hb and also low platelets  MCV is elevated 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  Also cirrhosis    Cholesterol Lab  Results  Component Value Date   CHOL 218* 07/17/2015   CHOL 181 07/09/2014   CHOL 224* 07/08/2013   Lab Results  Component Value Date   HDL 51.60 07/17/2015   HDL 48.60 07/09/2014   HDL 54.50 07/08/2013   Lab Results  Component Value Date   LDLCALC 146* 07/17/2015   LDLCALC 112* 07/09/2014   LDLCALC 122* 11/05/2010   Lab Results  Component Value Date   TRIG 100.0 07/17/2015   TRIG 100.0 07/09/2014   TRIG 107.0 07/08/2013   Lab Results  Component Value Date   CHOLHDL 4 07/17/2015   CHOLHDL 4 07/09/2014   CHOLHDL 4 07/08/2013   Lab Results  Component Value Date   LDLDIRECT 150.1 07/08/2013   LDLDIRECT 144.9 01/02/2013   LDLDIRECT 139.8 07/06/2012    LDL is up - ? Why  May have overindulged around the holidays Then Jan/feb- a lot of  eating out   Patient Active Problem List   Diagnosis Date Noted  . Calcification of right breast 05/06/2015  . Encounter for Medicare annual wellness exam 07/20/2014  . Unspecified constipation 10/25/2012  . Cirrhosis of liver without mention of alcohol 10/23/2012  . Hypokalemia 07/13/2012  . Routine general medical examination at a health care facility 11/04/2010  . Hyperlipidemia 03/18/2008  . Obesity 03/18/2008  . TRANSAMINASES, SERUM, ELEVATED 03/18/2008  . MENOPAUSE-RELATED VASOMOTOR SYMPTOMS, HOT FLASHES 10/23/2006  . Hypothyroidism 10/11/2006  . GLAUCOMA 10/11/2006  . Essential hypertension 10/11/2006  . GERD 10/11/2006  . History of kidney stones 10/11/2006   Past Medical History  Diagnosis Date  . GERD (gastroesophageal reflux disease)   . Hypertension   . Hyperthyroidism   . Menopausal syndrome   . Conjunctivitis     chronic  . Glaucoma   . Allergy   . Cancer ([place])     basal cell skin CA  . Hyperlipidemia     no per pt  . Chronic kidney disease     hx of kidney stones   Past Surgical History  Procedure Laterality Date  . Cholecystectomy    . Abdominal hysterectomy      partial ? prolapse  . Tonsillectomy    . Esophagogastroduodenoscopy     Social History  Substance Use Topics  . Smoking status: Former Research scientist (life sciences)  . Smokeless tobacco: Former Systems developer  . Alcohol Use: 0.0 oz/week    0 Standard drinks or equivalent per week     Comment: wine- rare   Family History  Problem Relation Age of Onset  . Heart disease Mother     CAD  . Hypertension Mother   . Glaucoma Mother   . Heart disease Father     CAD  . Diabetes Father   . Hypertension Father   . Cancer Father     ? CA  . Colon cancer Neg Hx   . Esophageal cancer Neg Hx   . Rectal cancer Neg Hx   . Stomach cancer Neg Hx    Allergies  Allergen Reactions  . Codeine     REACTION: Throat swelling  . Ivp Dye [Iodinated Diagnostic Agents] Swelling   Current Outpatient Prescriptions on File Prior to  Visit  Medication Sig Dispense Refill  . Biotin 2500 MCG CAPS Take 1 capsule by mouth daily.      . Calcium Carbonate-Vitamin D 600-400 MG-UNIT per tablet Take 1 tablet by mouth daily.     Marland Kitchen levothyroxine (SYNTHROID, LEVOTHROID) 150 MCG tablet Take 1 tablet (150 mcg total) by mouth daily.  90 tablet 3  . loratadine (CLARITIN) 10 MG tablet Take 10 mg by mouth daily as needed.      . metoprolol succinate (TOPROL-XL) 100 MG 24 hr tablet TAKE 1 TABLET BY MOUTH EVERY DAY WITH A MEAL 90 tablet 3  . Multiple Vitamin (MULTIVITAMIN) capsule Take 1 capsule by mouth daily.      . pantoprazole (PROTONIX) 40 MG tablet Take 1 tablet (40 mg total) by mouth daily. 90 tablet 3  . potassium chloride SA (KLOR-CON M20) 20 MEQ tablet TAKE ONE TABLET BY MOUTH EVERY DAY 90 tablet 3  . triamterene-hydrochlorothiazide (MAXZIDE-25) 37.5-25 MG per tablet Take 1 tablet by mouth daily. 90 tablet 3   No current facility-administered medications on file prior to visit.      Review of Systems Review of Systems  Constitutional: Negative for fever, appetite change, fatigue and unexpected weight change.  Eyes: Negative for pain and visual disturbance.  Respiratory: Negative for cough and shortness of breath.   Cardiovascular: Negative for cp or palpitations    Gastrointestinal: Negative for nausea, diarrhea and constipation.  Genitourinary: Negative for urgency and frequency.  Skin: Negative for pallor or rash   Neurological: Negative for weakness, light-headedness, numbness and headaches.  Hematological: Negative for adenopathy. Does not bruise/bleed easily.  Psychiatric/Behavioral: Negative for dysphoric mood. The patient is not nervous/anxious.         Objective:   Physical Exam  Constitutional: She appears well-developed and well-nourished. No distress.  obese and well appearing   HENT:  Head: Normocephalic and atraumatic.  Right Ear: External ear normal.  Left Ear: External ear normal.  Mouth/Throat:  Oropharynx is clear and moist.  Eyes: EOM are normal. Pupils are equal, round, and reactive to light. No scleral icterus.  Chronic conj injection noted  Neck: Normal range of motion. Neck supple. No JVD present. Carotid bruit is not present. No thyromegaly present.  Cardiovascular: Normal rate, regular rhythm, normal heart sounds and intact distal pulses.  Exam reveals no gallop.   Pulmonary/Chest: Effort normal and breath sounds normal. No respiratory distress. She has no wheezes. She exhibits no tenderness.  Abdominal: Soft. Bowel sounds are normal. She exhibits no distension, no abdominal bruit and no mass. There is no tenderness.  Genitourinary: No breast swelling, tenderness, discharge or bleeding.  Breast exam: No mass, nodules, thickening, tenderness, bulging, retraction, inflamation, nipple discharge or skin changes noted.  No axillary or clavicular LA.     Dense/fibrocystic breasts bilat  Musculoskeletal: Normal range of motion. She exhibits no edema or tenderness.  Lymphadenopathy:    She has no cervical adenopathy.  Neurological: She is alert. She has normal reflexes. No cranial nerve deficit. She exhibits normal muscle tone. Coordination normal.  Skin: Skin is warm and dry. No rash noted. No erythema. No pallor.  Psychiatric: She has a normal mood and affect.          Assessment & Plan:   Problem List Items Addressed This Visit      Cardiovascular and Mediastinum   Essential hypertension - Primary    bp in fair control at this time  BP Readings from Last 1 Encounters:  07/21/15 124/70   No changes needed Disc lifstyle change with low sodium diet and exercise  Labs reviewed       Relevant Medications   metoprolol succinate (TOPROL-XL) 100 MG 24 hr tablet   triamterene-hydrochlorothiazide (MAXZIDE-25) 37.5-25 MG tablet     Endocrine   Hypothyroidism    tsh is up -no missed  doses and taking correctly Inc dose of levothyroxine Re check tsh 3 mo       Relevant  Medications   levothyroxine (SYNTHROID) 175 MCG tablet   metoprolol succinate (TOPROL-XL) 100 MG 24 hr tablet     Other   Colon cancer screening    Ref for screening colonoscopy      Relevant Orders   Ambulatory referral to Gastroenterology   Encounter for Medicare annual wellness exam    Reviewed health habits including diet and exercise and skin cancer prevention Reviewed appropriate screening tests for age  Also reviewed health mt list, fam hx and immunization status , as well as social and family history   See HPI Labs rev  Stop at check out for referral for colonoscopy and bone density test Take 2000 iu vitamin D daily for bone health If you end up needing a referral for mammogram in July - let us know  Increase your thyroid dose  For weight loss (to help liver)-take a look at the app called "myfitnesspal" -it is very helpful  Avoid alcohol completely due to liver function  For cholesterol (Avoid red meat/ fried foods/ egg yolks/ fatty breakfast meats/ butter, cheese and high fat dairy/ and shellfish)  Follow up in 3 months with labs prior   (thyroid and cholesterol)       Estrogen deficiency   Relevant Orders   DG Bone Density   Hyperlipidemia    Up a bit from bad eating over the holidays Disc goals for lipids and reasons to control them Rev labs with pt Rev low sat fat diet in detail Enc better diet       Relevant Medications   metoprolol succinate (TOPROL-XL) 100 MG 24 hr tablet   triamterene-hydrochlorothiazide (MAXZIDE-25) 37.5-25 MG tablet   Obesity    Discussed how this problem influences overall health and the risks it imposes  Reviewed plan for weight loss with lower calorie diet (via better food choices and also portion control or program like weight watchers) and exercise building up to or more than 30 minutes 5 days per week including some aerobic activity   Stressed imp of this in light of fatty liver as well       TRANSAMINASES, SERUM, ELEVATED     From fatty liver Disc plan of low fat diet and wt loss  Avoid alcohol and acetaminophen

## 2015-07-21 NOTE — Progress Notes (Signed)
Pre visit review using our clinic review tool, if applicable. No additional management support is needed unless otherwise documented below in the visit note. 

## 2015-07-21 NOTE — Patient Instructions (Addendum)
Stop at check out for referral for colonoscopy and bone density test Take 2000 iu vitamin D daily for bone health If you end up needing a referral for mammogram in July - let us know  Increase your thyroid dose  For weight loss (to help liver)-take a look at the app called "myfitnesspal" -it is very helpful  Avoid alcohol completely due to liver function  For cholesterol (Avoid red meat/ fried foods/ egg yolks/ fatty breakfast meats/ butter, cheese and high fat dairy/ and shellfish)  Follow up in 3 months with labs prior   (thyroid and cholesterol)

## 2015-07-23 NOTE — Assessment & Plan Note (Signed)
Reviewed health habits including diet and exercise and skin cancer prevention Reviewed appropriate screening tests for age  Also reviewed health mt list, fam hx and immunization status , as well as social and family history   See HPI Labs rev  Stop at check out for referral for colonoscopy and bone density test Take 2000 iu vitamin D daily for bone health If you end up needing a referral for mammogram in July - let us know  Increase your thyroid dose  For weight loss (to help liver)-take a look at the app called "myfitnesspal" -it is very helpful  Avoid alcohol completely due to liver function  For cholesterol (Avoid red meat/ fried foods/ egg yolks/ fatty breakfast meats/ butter, cheese and high fat dairy/ and shellfish)  Follow up in 3 months with labs prior   (thyroid and cholesterol)

## 2015-07-23 NOTE — Assessment & Plan Note (Signed)
Up a bit from bad eating over the holidays Disc goals for lipids and reasons to control them Rev labs with pt Rev low sat fat diet in detail Enc better diet

## 2015-07-23 NOTE — Assessment & Plan Note (Signed)
Ref for screening colonoscopy

## 2015-07-23 NOTE — Assessment & Plan Note (Signed)
bp in fair control at this time  BP Readings from Last 1 Encounters:  07/21/15 124/70   No changes needed Disc lifstyle change with low sodium diet and exercise  Labs reviewed

## 2015-07-23 NOTE — Assessment & Plan Note (Signed)
Discussed how this problem influences overall health and the risks it imposes  Reviewed plan for weight loss with lower calorie diet (via better food choices and also portion control or program like weight watchers) and exercise building up to or more than 30 minutes 5 days per week including some aerobic activity   Stressed imp of this in light of fatty liver as well

## 2015-07-23 NOTE — Assessment & Plan Note (Signed)
From fatty liver Disc plan of low fat diet and wt loss  Avoid alcohol and acetaminophen

## 2015-07-23 NOTE — Assessment & Plan Note (Addendum)
tsh is up -no missed doses and taking correctly Inc dose of levothyroxine Re check tsh 3 mo

## 2015-07-30 DIAGNOSIS — R928 Other abnormal and inconclusive findings on diagnostic imaging of breast: Secondary | ICD-10-CM | POA: Diagnosis not present

## 2015-07-30 DIAGNOSIS — Z780 Asymptomatic menopausal state: Secondary | ICD-10-CM | POA: Diagnosis not present

## 2015-08-13 ENCOUNTER — Telehealth: Payer: Self-pay | Admitting: *Deleted

## 2015-08-13 NOTE — Telephone Encounter (Signed)
Pt notified of DEXA results

## 2015-08-13 NOTE — Telephone Encounter (Signed)
Dr. Glori Bickers placed pt's DEXA (bone density) scan in my inbox that said "Normal Bone Density, please let pt know"  Left voicemail requesting pt to call office back (no answer)

## 2015-08-14 DIAGNOSIS — R51 Headache: Secondary | ICD-10-CM | POA: Diagnosis not present

## 2015-10-19 ENCOUNTER — Other Ambulatory Visit (INDEPENDENT_AMBULATORY_CARE_PROVIDER_SITE_OTHER): Payer: Medicare Other

## 2015-10-19 DIAGNOSIS — R740 Nonspecific elevation of levels of transaminase and lactic acid dehydrogenase [LDH]: Secondary | ICD-10-CM

## 2015-10-19 DIAGNOSIS — E785 Hyperlipidemia, unspecified: Secondary | ICD-10-CM

## 2015-10-19 DIAGNOSIS — E039 Hypothyroidism, unspecified: Secondary | ICD-10-CM

## 2015-10-19 DIAGNOSIS — R7402 Elevation of levels of lactic acid dehydrogenase (LDH): Secondary | ICD-10-CM

## 2015-10-19 DIAGNOSIS — R7401 Elevation of levels of liver transaminase levels: Secondary | ICD-10-CM

## 2015-10-19 LAB — LIPID PANEL
CHOLESTEROL: 218 mg/dL — AB (ref 0–200)
HDL: 52.5 mg/dL (ref >39.00)
LDL Cholesterol: 148 mg/dL — ABNORMAL HIGH (ref 0–99)
NonHDL: 165.18
TRIGLYCERIDES: 87 mg/dL (ref 0.0–149.0)
Total CHOL/HDL Ratio: 4
VLDL: 17.4 mg/dL (ref 0.0–40.0)

## 2015-10-19 LAB — TSH: TSH: 2.32 u[IU]/mL (ref 0.35–4.50)

## 2015-10-19 LAB — AST: AST: 45 U/L — AB (ref 0–37)

## 2015-10-19 LAB — ALT: ALT: 29 U/L (ref 0–35)

## 2015-11-05 DIAGNOSIS — H903 Sensorineural hearing loss, bilateral: Secondary | ICD-10-CM | POA: Diagnosis not present

## 2015-11-05 DIAGNOSIS — H9113 Presbycusis, bilateral: Secondary | ICD-10-CM | POA: Diagnosis not present

## 2015-12-30 DIAGNOSIS — H40003 Preglaucoma, unspecified, bilateral: Secondary | ICD-10-CM | POA: Diagnosis not present

## 2016-02-15 ENCOUNTER — Encounter: Payer: Self-pay | Admitting: Gastroenterology

## 2016-02-25 DIAGNOSIS — R921 Mammographic calcification found on diagnostic imaging of breast: Secondary | ICD-10-CM | POA: Diagnosis not present

## 2016-02-25 DIAGNOSIS — Z09 Encounter for follow-up examination after completed treatment for conditions other than malignant neoplasm: Secondary | ICD-10-CM | POA: Diagnosis not present

## 2016-02-29 ENCOUNTER — Encounter: Payer: Self-pay | Admitting: Family Medicine

## 2016-03-03 DIAGNOSIS — L905 Scar conditions and fibrosis of skin: Secondary | ICD-10-CM | POA: Diagnosis not present

## 2016-03-03 DIAGNOSIS — L570 Actinic keratosis: Secondary | ICD-10-CM | POA: Diagnosis not present

## 2016-03-03 DIAGNOSIS — Z85828 Personal history of other malignant neoplasm of skin: Secondary | ICD-10-CM | POA: Diagnosis not present

## 2016-03-03 DIAGNOSIS — D1801 Hemangioma of skin and subcutaneous tissue: Secondary | ICD-10-CM | POA: Diagnosis not present

## 2016-03-03 DIAGNOSIS — L821 Other seborrheic keratosis: Secondary | ICD-10-CM | POA: Diagnosis not present

## 2016-03-03 DIAGNOSIS — D235 Other benign neoplasm of skin of trunk: Secondary | ICD-10-CM | POA: Diagnosis not present

## 2016-03-14 DIAGNOSIS — Z23 Encounter for immunization: Secondary | ICD-10-CM | POA: Diagnosis not present

## 2016-03-25 ENCOUNTER — Encounter: Payer: Self-pay | Admitting: Family Medicine

## 2016-04-01 ENCOUNTER — Ambulatory Visit (AMBULATORY_SURGERY_CENTER): Payer: Self-pay

## 2016-04-01 VITALS — Ht 65.0 in | Wt 216.4 lb

## 2016-04-01 DIAGNOSIS — Z1211 Encounter for screening for malignant neoplasm of colon: Secondary | ICD-10-CM

## 2016-04-01 MED ORDER — SUPREP BOWEL PREP KIT 17.5-3.13-1.6 GM/177ML PO SOLN: 1.0000 | Freq: Once | ORAL | 0 refills | Status: AC

## 2016-04-01 NOTE — Progress Notes (Signed)
No allergies to eggs or soy No diet meds No home oxygen No past problems with anesthesia  Declined emmi

## 2016-04-15 ENCOUNTER — Encounter: Payer: Self-pay | Admitting: Gastroenterology

## 2016-04-15 ENCOUNTER — Ambulatory Visit (AMBULATORY_SURGERY_CENTER): Payer: Medicare Other | Admitting: Gastroenterology

## 2016-04-15 VITALS — BP 156/82 | HR 69 | Temp 98.4°F | Resp 15 | Ht 65.0 in | Wt 216.0 lb

## 2016-04-15 DIAGNOSIS — D122 Benign neoplasm of ascending colon: Secondary | ICD-10-CM

## 2016-04-15 DIAGNOSIS — Z1212 Encounter for screening for malignant neoplasm of rectum: Secondary | ICD-10-CM

## 2016-04-15 DIAGNOSIS — D120 Benign neoplasm of cecum: Secondary | ICD-10-CM

## 2016-04-15 DIAGNOSIS — Z1211 Encounter for screening for malignant neoplasm of colon: Secondary | ICD-10-CM

## 2016-04-15 DIAGNOSIS — K6389 Other specified diseases of intestine: Secondary | ICD-10-CM | POA: Diagnosis not present

## 2016-04-15 DIAGNOSIS — I10 Essential (primary) hypertension: Secondary | ICD-10-CM | POA: Diagnosis not present

## 2016-04-15 MED ORDER — SODIUM CHLORIDE 0.9 % IV SOLN: 500.0000 mL | INTRAVENOUS | Status: DC

## 2016-04-15 NOTE — Op Note (Signed)
[facility] Patient Name: Selena Johnson Procedure Date: 04/15/2016 9:32 AM MRN: [id number] Endoscopist: Mauri Pole , MD Age: 65 Referring MD:  Date of Birth: [birth year] Gender: Female Account #: [id number] Procedure:                Colonoscopy Indications:              Screening for colorectal malignant neoplasm, Last                            colonoscopy: 2007 Medicines:                Monitored Anesthesia Care Procedure:                Pre-Anesthesia Assessment:                           - Prior to the procedure, a History and Physical                            was performed, and patient medications and                            allergies were reviewed. The patient's tolerance of                            previous anesthesia was also reviewed. The risks                            and benefits of the procedure and the sedation                            options and risks were discussed with the patient.                            All questions were answered, and informed consent                            was obtained. Prior Anticoagulants: The patient has                            taken no previous anticoagulant or antiplatelet                            agents. ASA Grade Assessment: II - A patient with                            mild systemic disease. After reviewing the risks                            and benefits, the patient was deemed in                            satisfactory condition to undergo the procedure.  After obtaining informed consent, the colonoscope                            was passed under direct vision. Throughout the                            procedure, the patient's blood pressure, pulse, and                            oxygen saturations were monitored continuously. The                            Model CF-HQ190L [id number]) scope was introduced                            through the anus and advanced to the  the terminal                            ileum, with identification of the appendiceal                            orifice and IC valve. The colonoscopy was performed                            without difficulty. The patient tolerated the                            procedure well. The quality of the bowel                            preparation was adequate. The terminal ileum,                            ileocecal valve, appendiceal orifice, and rectum                            were photographed. Scope In: 9:45:12 AM Scope Out: 10:15:42 AM Scope Withdrawal Time: 0 hours 23 minutes 56 seconds  Total Procedure Duration: 0 hours 30 minutes 30 seconds  Findings:                 The perianal and digital rectal examinations were                            normal.                           A localized area of moderately erythematous mucosa                            was found in the cecum. Biopsies were taken with a                            cold forceps for histology.  Rest of the colon and rectum mucosa appeared                            normal. Biopsies were taken with a cold forceps in                            the rectum for histology.                           The terminal ileum appeared normal. Biopsies were                            taken with a cold forceps for histology.                           A 2 mm polyp was found in the cecum. The polyp was                            sessile. The polyp was removed with a cold biopsy                            forceps. Resection and retrieval were complete.                           A 4 mm polyp was found in the ascending colon. The                            polyp was sessile. The polyp was removed with a                            cold snare. Resection and retrieval were complete. Complications:            No immediate complications. Estimated Blood Loss:     Estimated blood loss was minimal. Impression:                - Erythematous mucosa in the cecum. Biopsied.                           - The examined portion of the ileum was normal.                            Biopsied.                           - Biopsies were taken with a cold forceps for                            histology in the rectum.                           - One 2 mm polyp in the cecum, removed with a cold  biopsy forceps. Resected and retrieved.                           - One 4 mm polyp in the ascending colon, removed                            with a cold snare. Resected and retrieved. Recommendation:           - Patient has a contact number available for                            emergencies. The signs and symptoms of potential                            delayed complications were discussed with the                            patient. Return to normal activities tomorrow.                            Written discharge instructions were provided to the                            patient.                           - Resume previous diet.                           - Continue present medications.                           - Await pathology results.                           - Repeat colonoscopy in 3 - 5 years for                            surveillance based on pathology results.                           - Return to GI clinic PRN.                           - No aspirin, ibuprofen, naproxen, or other                            non-steroidal anti-inflammatory drugs. Mauri Pole, MD 04/15/2016 10:26:04 AM This report has been signed electronically.

## 2016-04-15 NOTE — Patient Instructions (Signed)
Discharge instructions given. Handout on polyps. No aspirin,ibuprofen,naproxen, or other non-steroidal anti-inflammatory drugs. Resume previous medications. YOU HAD AN ENDOSCOPIC PROCEDURE TODAY AT [facility]:   Refer to the procedure report that was given to you for any specific questions about what was found during the examination.  If the procedure report does not answer your questions, please call your gastroenterologist to clarify.  If you requested that your care partner not be given the details of your procedure findings, then the procedure report has been included in a sealed envelope for you to review at your convenience later.  YOU SHOULD EXPECT: Some feelings of bloating in the abdomen. Passage of more gas than usual.  Walking can help get rid of the air that was put into your GI tract during the procedure and reduce the bloating. If you had a lower endoscopy (such as a colonoscopy or flexible sigmoidoscopy) you may notice spotting of blood in your stool or on the toilet paper. If you underwent a bowel prep for your procedure, you may not have a normal bowel movement for a few days.  Please Note:  You might notice some irritation and congestion in your nose or some drainage.  This is from the oxygen used during your procedure.  There is no need for concern and it should clear up in a day or so.  SYMPTOMS TO REPORT IMMEDIATELY:   Following lower endoscopy (colonoscopy or flexible sigmoidoscopy):  Excessive amounts of blood in the stool  Significant tenderness or worsening of abdominal pains  Swelling of the abdomen that is new, acute  Fever of 100F or higher   For urgent or emergent issues, a gastroenterologist can be reached at any hour by calling [phone].   DIET:  We do recommend a small meal at first, but then you may proceed to your regular diet.  Drink plenty of fluids but you should avoid alcoholic beverages for 24 hours.  ACTIVITY:  You should plan  to take it easy for the rest of today and you should NOT DRIVE or use heavy machinery until tomorrow (because of the sedation medicines used during the test).    FOLLOW UP: Our staff will call the number listed on your records the next business day following your procedure to check on you and address any questions or concerns that you may have regarding the information given to you following your procedure. If we do not reach you, we will leave a message.  However, if you are feeling well and you are not experiencing any problems, there is no need to return our call.  We will assume that you have returned to your regular daily activities without incident.  If any biopsies were taken you will be contacted by phone or by letter within the next 1-3 weeks.  Please call us at [phone] if you have not heard about the biopsies in 3 weeks.    SIGNATURES/CONFIDENTIALITY: You and/or your care partner have signed paperwork which will be entered into your electronic medical record.  These signatures attest to the fact that that the information above on your After Visit Summary has been reviewed and is understood.  Full responsibility of the confidentiality of this discharge information lies with you and/or your care-partner.

## 2016-04-15 NOTE — Progress Notes (Signed)
Report to PACU, RN, vss, BBS= Clear.  

## 2016-04-15 NOTE — Progress Notes (Signed)
Moderate amount of air expelled. Assist to bathroom. Stated I feel much better now.

## 2016-04-18 ENCOUNTER — Telehealth: Payer: Self-pay

## 2016-04-18 NOTE — Telephone Encounter (Signed)
  Follow up Call-  Call back number 04/15/2016  Post procedure Call Back phone  # [phone]  Permission to leave phone message Yes  Some recent data might be hidden     Patient questions:  Do you have a fever, pain , or abdominal swelling? No. Pain Score  0 *  Have you tolerated food without any problems? Yes.    Have you been able to return to your normal activities? Yes.    Do you have any questions about your discharge instructions: Diet   No. Medications  No. Follow up visit  No.  Do you have questions or concerns about your Care? No.  Actions: * If pain score is 4 or above: No action needed, pain <4.

## 2016-04-20 ENCOUNTER — Encounter: Payer: Self-pay | Admitting: Gastroenterology

## 2016-06-13 ENCOUNTER — Telehealth: Payer: Self-pay

## 2016-06-13 NOTE — Telephone Encounter (Signed)
Pt left v/m requesting cb about refills; pt last seen 07/21/15 and has wellness exam with Dr Glori Bickers scheduled for 07/26/16.Left v/m at both contact #s for cb.

## 2016-06-14 MED ORDER — METOPROLOL SUCCINATE ER 100 MG PO TB24: ORAL_TABLET | ORAL | 0 refills | Status: DC

## 2016-06-14 MED ORDER — LEVOTHYROXINE SODIUM 175 MCG PO TABS
175.0000 ug | ORAL_TABLET | Freq: Every day | ORAL | 0 refills | Status: DC
Start: 2016-06-14 — End: 2016-07-26

## 2016-06-14 MED ORDER — POTASSIUM CHLORIDE CRYS ER 20 MEQ PO TBCR: EXTENDED_RELEASE_TABLET | ORAL | 0 refills | Status: DC

## 2016-06-14 MED ORDER — TRIAMTERENE-HCTZ 37.5-25 MG PO TABS: 1.0000 | ORAL_TABLET | Freq: Every day | ORAL | 0 refills | Status: DC

## 2016-06-14 NOTE — Telephone Encounter (Signed)
Pt does not have enough med to last until 07/26/16 appt and pt is changing pharmacies to [facility] order (pt has already set up acct). Refill # 90 on levothyroxine,metoprolol, K and triamterene HCTZ until seen. Pt voiced understanding.

## 2016-06-28 DIAGNOSIS — H40003 Preglaucoma, unspecified, bilateral: Secondary | ICD-10-CM | POA: Diagnosis not present

## 2016-07-06 DIAGNOSIS — H40003 Preglaucoma, unspecified, bilateral: Secondary | ICD-10-CM | POA: Diagnosis not present

## 2016-07-18 ENCOUNTER — Telehealth: Payer: Self-pay | Admitting: Family Medicine

## 2016-07-18 DIAGNOSIS — I10 Essential (primary) hypertension: Secondary | ICD-10-CM

## 2016-07-18 DIAGNOSIS — E039 Hypothyroidism, unspecified: Secondary | ICD-10-CM

## 2016-07-18 DIAGNOSIS — E7800 Pure hypercholesterolemia, unspecified: Secondary | ICD-10-CM

## 2016-07-18 NOTE — Telephone Encounter (Signed)
-----   Message from Eustace Pen, LPN sent at 8/37/7939  2:25 PM EST ----- Regarding: Labs 07/18/16 Please place lab orders. Thank you.

## 2016-07-21 ENCOUNTER — Ambulatory Visit (INDEPENDENT_AMBULATORY_CARE_PROVIDER_SITE_OTHER): Payer: Medicare Other

## 2016-07-21 VITALS — BP 122/76 | HR 65 | Temp 97.7°F | Ht 65.5 in | Wt 213.0 lb

## 2016-07-21 DIAGNOSIS — Z0000 Encounter for general adult medical examination without abnormal findings: Secondary | ICD-10-CM

## 2016-07-21 DIAGNOSIS — I10 Essential (primary) hypertension: Secondary | ICD-10-CM | POA: Diagnosis not present

## 2016-07-21 DIAGNOSIS — E039 Hypothyroidism, unspecified: Secondary | ICD-10-CM

## 2016-07-21 DIAGNOSIS — E7800 Pure hypercholesterolemia, unspecified: Secondary | ICD-10-CM | POA: Diagnosis not present

## 2016-07-21 LAB — CBC WITH DIFFERENTIAL/PLATELET
BASOS PCT: 1.5 % (ref 0.0–3.0)
Basophils Absolute: 0.1 10*3/uL (ref 0.0–0.1)
EOS PCT: 5.9 % — AB (ref 0.0–5.0)
Eosinophils Absolute: 0.3 10*3/uL (ref 0.0–0.7)
HEMATOCRIT: 43.5 % (ref 36.0–46.0)
HEMOGLOBIN: 15.2 g/dL — AB (ref 12.0–15.0)
LYMPHS PCT: 27.4 % (ref 12.0–46.0)
Lymphs Abs: 1.2 10*3/uL (ref 0.7–4.0)
MCHC: 34.9 g/dL (ref 30.0–36.0)
MCV: 99.8 fl (ref 78.0–100.0)
Monocytes Absolute: 0.3 10*3/uL (ref 0.1–1.0)
Monocytes Relative: 7.3 % (ref 3.0–12.0)
Neutro Abs: 2.5 10*3/uL (ref 1.4–7.7)
Neutrophils Relative %: 57.9 % (ref 43.0–77.0)
Platelets: 79 10*3/uL — ABNORMAL LOW (ref 150.0–400.0)
RBC: 4.36 Mil/uL (ref 3.87–5.11)
RDW: 13.7 % (ref 11.5–15.5)
WBC: 4.3 10*3/uL (ref 4.0–10.5)

## 2016-07-21 LAB — LIPID PANEL
CHOLESTEROL: 195 mg/dL (ref 0–200)
HDL: 48.2 mg/dL (ref >39.00)
LDL CALC: 125 mg/dL — AB (ref 0–99)
NonHDL: 146.51
TRIGLYCERIDES: 107 mg/dL (ref 0.0–149.0)
Total CHOL/HDL Ratio: 4
VLDL: 21.4 mg/dL (ref 0.0–40.0)

## 2016-07-21 LAB — COMPREHENSIVE METABOLIC PANEL
ALBUMIN: 3.9 g/dL (ref 3.5–5.2)
ALT: 30 U/L (ref 0–35)
AST: 43 U/L — ABNORMAL HIGH (ref 0–37)
Alkaline Phosphatase: 82 U/L (ref 39–117)
BUN: 8 mg/dL (ref 6–23)
CALCIUM: 9.4 mg/dL (ref 8.4–10.5)
CHLORIDE: 105 meq/L (ref 96–112)
CO2: 31 mEq/L (ref 19–32)
Creatinine, Ser: 0.65 mg/dL (ref 0.40–1.20)
GFR: 96.4 mL/min (ref >60.00)
Glucose, Bld: 89 mg/dL (ref 70–99)
POTASSIUM: 3.8 meq/L (ref 3.5–5.1)
Sodium: 140 mEq/L (ref 135–145)
Total Bilirubin: 1.4 mg/dL — ABNORMAL HIGH (ref 0.2–1.2)
Total Protein: 6.5 g/dL (ref 6.0–8.3)

## 2016-07-21 LAB — TSH: TSH: 2.04 u[IU]/mL (ref 0.35–4.50)

## 2016-07-21 NOTE — Progress Notes (Signed)
Pre visit review using our clinic review tool, if applicable. No additional management support is needed unless otherwise documented below in the visit note. 

## 2016-07-21 NOTE — Patient Instructions (Signed)
Ms. Lowdermilk , Thank you for taking time to come for your Medicare Wellness Visit. I appreciate your ongoing commitment to your health goals. Please review the following plan we discussed and let me know if I can assist you in the future.   These are the goals we discussed: Goals    . Increase physical activity          Starting 07/21/16, I will continue to walk at least 30 min daily as weather permits.        This is a list of the screening recommended for you and due dates:  Health Maintenance  Topic Date Due  . Tetanus Vaccine  10/22/2016  . Pneumonia vaccines (2 of 2 - PPSV23) 10/25/2017  . Mammogram  02/24/2018  . Colon Cancer Screening  04/15/2021  . Flu Shot  Addressed  . DEXA scan (bone density measurement)  Completed  . Shingles Vaccine  Completed  .  Hepatitis C: One time screening is recommended by Center for Disease Control  (CDC) for  adults born from [birth year] through 1967.   Completed   Preventive Care for Adults  A healthy lifestyle and preventive care can promote health and wellness. Preventive health guidelines for adults include the following key practices.  . A routine yearly physical is a good way to check with your health care provider about your health and preventive screening. It is a chance to share any concerns and updates on your health and to receive a thorough exam.  . Visit your dentist for a routine exam and preventive care every 6 months. Brush your teeth twice a day and floss once a day. Good oral hygiene prevents tooth decay and gum disease.  . The frequency of eye exams is based on your age, health, family medical history, use  of contact lenses, and other factors. Follow your health care provider's ecommendations for frequency of eye exams.  . Eat a healthy diet. Foods like vegetables, fruits, whole grains, low-fat dairy products, and lean protein foods contain the nutrients you need without too many calories. Decrease your intake of foods high in solid  fats, added sugars, and salt. Eat the right amount of calories for you. Get information about a proper diet from your health care provider, if necessary.  . Regular physical exercise is one of the most important things you can do for your health. Most adults should get at least 150 minutes of moderate-intensity exercise (any activity that increases your heart rate and causes you to sweat) each week. In addition, most adults need muscle-strengthening exercises on 2 or more days a week.  Silver Sneakers may be a benefit available to you. To determine eligibility, you may visit the website: www.silversneakers.com or contact program at (443)886-5843 Mon-Fri between 8AM-8PM.   . Maintain a healthy weight. The body mass index (BMI) is a screening tool to identify possible weight problems. It provides an estimate of body fat based on height and weight. Your health care provider can find your BMI and can help you achieve or maintain a healthy weight.   For adults 20 years and older: ? A BMI below 18.5 is considered underweight. ? A BMI of 18.5 to 24.9 is normal. ? A BMI of 25 to 29.9 is considered overweight. ? A BMI of 30 and above is considered obese.   . Maintain normal blood lipids and cholesterol levels by exercising and minimizing your intake of saturated fat. Eat a balanced diet with plenty of fruit and vegetables. Blood  tests for lipids and cholesterol should begin at age 56 and be repeated every 5 years. If your lipid or cholesterol levels are high, you are over 50, or you are at high risk for heart disease, you may need your cholesterol levels checked more frequently. Ongoing high lipid and cholesterol levels should be treated with medicines if diet and exercise are not working.  . If you smoke, find out from your health care provider how to quit. If you do not use tobacco, please do not start.  . If you choose to drink alcohol, please do not consume more than 2 drinks per day. One drink is  considered to be 12 ounces (355 mL) of beer, 5 ounces (148 mL) of wine, or 1.5 ounces (44 mL) of liquor.  . If you are 17-31 years old, ask your health care provider if you should take aspirin to prevent strokes.  . Use sunscreen. Apply sunscreen liberally and repeatedly throughout the day. You should seek shade when your shadow is shorter than you. Protect yourself by wearing long sleeves, pants, a wide-brimmed hat, and sunglasses year round, whenever you are outdoors.  . Once a month, do a whole body skin exam, using a mirror to look at the skin on your back. Tell your health care provider of new moles, moles that have irregular borders, moles that are larger than a pencil eraser, or moles that have changed in shape or color.

## 2016-07-21 NOTE — Progress Notes (Signed)
Subjective:   Selena Johnson is a 66 y.o. female who presents for Medicare Annual (Subsequent) preventive examination.  Review of Systems:  N/A Cardiac Risk Factors include: advanced age (>75mn, >>34women);obesity (BMI >30kg/m2);dyslipidemia;hypertension     Objective:     Vitals: BP 122/76 (BP Location: Left Arm, Patient Position: Sitting, Cuff Size: Normal)   Pulse 65   Temp 97.7 F (36.5 C) (Oral)   Ht 5' 5.5" (1.664 m) Comment: no shoes  Wt 213 lb (96.6 kg)   SpO2 98%   BMI 34.91 kg/m   Body mass index is 34.91 kg/m.   Tobacco History  Smoking Status  . Former Smoker  Smokeless Tobacco  . Never Used     Counseling given: No   Past Medical History:  Diagnosis Date  . Allergy   . [place]    basal cell skin CA  . Chronic kidney disease    hx of kidney stones  . Conjunctivitis    chronic  . GERD (gastroesophageal reflux disease)   . Glaucoma   . Hearing loss in left ear   . Hyperlipidemia    no per pt  . Hypertension   . Menopausal syndrome   . NASH (nonalcoholic steatohepatitis)    with cirrhosis and mild esoph varicies  . Primary hypothyroidism    Past Surgical History:  Procedure Laterality Date  . ABDOMINAL HYSTERECTOMY     partial ? prolapse  . CHOLECYSTECTOMY    . ESOPHAGOGASTRODUODENOSCOPY    . TONSILLECTOMY     Family History  Problem Relation Age of Onset  . Heart disease Mother     CAD  . Hypertension Mother   . Glaucoma Mother   . Heart disease Father     CAD  . Diabetes Father   . Hypertension Father   . Cancer Father     ? CA  . Colon cancer Neg Hx   . Esophageal cancer Neg Hx   . Rectal cancer Neg Hx   . Stomach cancer Neg Hx    History  Sexual Activity  . Sexual activity: Not on file    Outpatient Encounter Prescriptions as of 07/21/2016  Medication Sig  . Biotin 2500 MCG CAPS Take 1 capsule by mouth daily.    .Marland Kitchenlevothyroxine (SYNTHROID) 175 MCG tablet Take 1 tablet (175 mcg total) by mouth daily before  breakfast.  . loratadine (CLARITIN) 10 MG tablet Take 10 mg by mouth daily as needed.    . metoprolol succinate (TOPROL-XL) 100 MG 24 hr tablet TAKE 1 TABLET BY MOUTH EVERY DAY WITH A MEAL  . Multiple Vitamin (MULTIVITAMIN) capsule Take 1 capsule by mouth daily.    . potassium chloride SA (KLOR-CON M20) 20 MEQ tablet TAKE ONE TABLET BY MOUTH EVERY DAY  . triamterene-hydrochlorothiazide (MAXZIDE-25) 37.5-25 MG tablet Take 1 tablet by mouth daily.  . Calcium Carbonate-Vitamin D 600-400 MG-UNIT per tablet Take 1 tablet by mouth daily.    Facility-Administered Encounter Medications as of 07/21/2016  Medication  . 0.9 %  sodium chloride infusion    Activities of Daily Living In your present state of health, do you have any difficulty performing the following activities: 07/21/2016  Hearing? N  Vision? N  Difficulty concentrating or making decisions? N  Walking or climbing stairs? N  Dressing or bathing? N  Doing errands, shopping? N  Preparing Food and eating ? N  Using the Toilet? N  In the past six months, have you accidently leaked urine? N  Do you have problems with loss of bowel control? N  Managing your Medications? N  Managing your Finances? N  Housekeeping or managing your Housekeeping? N  Some recent data might be hidden    Patient Care Team: Abner Greenspan, MD as PCP - General Ronnell Freshwater, MD as Referring Physician (Ophthalmology) Glennie Isle, PA-C as Physician Assistant (Physician Assistant)    Assessment:     Hearing Screening   125Hz  250Hz  500Hz  1000Hz  2000Hz  3000Hz  4000Hz  6000Hz  8000Hz   Right ear:   40 0 40  0    Left ear:   0 0 0  0    Comments: Pt had formal hearing test with audiologist in 2017.Passed exam. No hearing aids recommended.   Vision Screening Comments: Last vision exam in Jan 2018 with Dr. Cephus Shelling   Exercise Activities and Dietary recommendations Current Exercise Habits: Home exercise routine, Type of exercise: walking, Time (Minutes):  30, Frequency (Times/Week): 7, Weekly Exercise (Minutes/Week): 210, Intensity: Mild, Exercise limited by: None identified  Goals    . Increase physical activity          Starting 07/21/16, I will continue to walk at least 30 min daily as weather permits.       Fall Risk Fall Risk  07/21/2016 07/21/2015 07/18/2014  Falls in the past year? No No No   Depression Screen PHQ 2/9 Scores 07/21/2016 07/21/2015 07/18/2014 07/13/2012  PHQ - 2 Score 0 0 0 0     Cognitive Function MMSE - Mini Mental State Exam 07/21/2016  Orientation to time 5  Orientation to Place 5  Registration 3  Attention/ Calculation 0  Recall 3  Language- name 2 objects 0  Language- repeat 1  Language- follow 3 step command 3  Language- read & follow direction 0  Write a sentence 0  Copy design 0  Total score 20     PLEASE NOTE: A Mini-Cog screen was completed. Maximum score is 20. A value of 0 denotes this part of Folstein MMSE was not completed or the patient failed this part of the Mini-Cog screening.   Mini-Cog Screening Orientation to Time - Max 5 pts Orientation to Place - Max 5 pts Registration - Max 3 pts Recall - Max 3 pts Language Repeat - Max 1 pts Language Follow 3 Step Command - Max 3 pts    Immunization History  Administered Date(s) Administered  . Hep A / Hep B 10/25/2012, 11/01/2012, 11/26/2012, 10/29/2013  . Influenza Split 03/03/2011  . Influenza Whole 03/16/2007, 03/10/2008, 03/18/2009, 03/31/2010  . Influenza-Unspecified 03/07/2014, 04/03/2015, 03/14/2016  . Pneumococcal Conjugate-13 07/18/2014  . Pneumococcal Polysaccharide-23 10/25/2012  . Td 06/06/1996, 10/23/2006  . Zoster 11/03/2009   Screening Tests Health Maintenance  Topic Date Due  . TETANUS/TDAP  10/22/2016  . PNA vac Low Risk Adult (2 of 2 - PPSV23) 10/25/2017  . MAMMOGRAM  02/24/2018  . COLONOSCOPY  04/15/2021  . INFLUENZA VACCINE  Addressed  . DEXA SCAN  Completed  . ZOSTAVAX  Completed  . Hepatitis C Screening   Completed      Plan:     I have personally reviewed and addressed the Medicare Annual Wellness questionnaire and have noted the following in the patient's chart:  A. Medical and social history B. Use of alcohol, tobacco or illicit drugs  C. Current medications and supplements D. Functional ability and status E.  Nutritional status F.  Physical activity G. Advance directives H. List of other physicians I.  Hospitalizations, surgeries, and ER  visits in previous 12 months J.  [place] to include hearing, vision, cognitive, depression L. Referrals and appointments - none  In addition, I have reviewed and discussed with patient certain preventive protocols, quality metrics, and best practice recommendations. A written personalized care plan for preventive services as well as general preventive health recommendations were provided to patient.  See attached scanned questionnaire for additional information.   Signed,   Lindell Noe, MHA, BS, LPN Health Coach

## 2016-07-21 NOTE — Progress Notes (Signed)
PCP notes:   Health maintenance:  No gaps identified.   Abnormal screenings:   Hearing - failed  Patient concerns:    None  Nurse concerns:  None  Next PCP appt:   07/26/16 @ 0900

## 2016-07-22 NOTE — Progress Notes (Signed)
I reviewed health advisor's note, was available for consultation, and agree with documentation and plan.  

## 2016-07-26 ENCOUNTER — Encounter: Payer: Self-pay | Admitting: Family Medicine

## 2016-07-26 ENCOUNTER — Ambulatory Visit (INDEPENDENT_AMBULATORY_CARE_PROVIDER_SITE_OTHER): Payer: Medicare Other | Admitting: Family Medicine

## 2016-07-26 VITALS — BP 126/78 | HR 59 | Temp 98.3°F | Ht 65.5 in | Wt 213.5 lb

## 2016-07-26 DIAGNOSIS — I10 Essential (primary) hypertension: Secondary | ICD-10-CM

## 2016-07-26 DIAGNOSIS — E039 Hypothyroidism, unspecified: Secondary | ICD-10-CM

## 2016-07-26 DIAGNOSIS — Z23 Encounter for immunization: Secondary | ICD-10-CM | POA: Diagnosis not present

## 2016-07-26 DIAGNOSIS — D696 Thrombocytopenia, unspecified: Secondary | ICD-10-CM | POA: Diagnosis not present

## 2016-07-26 DIAGNOSIS — Z6834 Body mass index (BMI) 34.0-34.9, adult: Secondary | ICD-10-CM

## 2016-07-26 DIAGNOSIS — E7800 Pure hypercholesterolemia, unspecified: Secondary | ICD-10-CM | POA: Diagnosis not present

## 2016-07-26 DIAGNOSIS — R7402 Elevation of levels of lactic acid dehydrogenase (LDH): Secondary | ICD-10-CM

## 2016-07-26 DIAGNOSIS — R740 Nonspecific elevation of levels of transaminase and lactic acid dehydrogenase [LDH]: Secondary | ICD-10-CM | POA: Diagnosis not present

## 2016-07-26 DIAGNOSIS — E6609 Other obesity due to excess calories: Secondary | ICD-10-CM | POA: Diagnosis not present

## 2016-07-26 DIAGNOSIS — R7401 Elevation of levels of liver transaminase levels: Secondary | ICD-10-CM

## 2016-07-26 DIAGNOSIS — K7460 Unspecified cirrhosis of liver: Secondary | ICD-10-CM

## 2016-07-26 MED ORDER — TRIAMTERENE-HCTZ 37.5-25 MG PO TABS: 1.0000 | ORAL_TABLET | Freq: Every day | ORAL | 3 refills | Status: DC

## 2016-07-26 MED ORDER — LEVOTHYROXINE SODIUM 175 MCG PO TABS: 175.0000 ug | ORAL_TABLET | Freq: Every day | ORAL | 3 refills | Status: DC

## 2016-07-26 MED ORDER — METOPROLOL SUCCINATE ER 100 MG PO TB24: ORAL_TABLET | ORAL | 3 refills | Status: DC

## 2016-07-26 MED ORDER — POTASSIUM CHLORIDE CRYS ER 20 MEQ PO TBCR: EXTENDED_RELEASE_TABLET | ORAL | 3 refills | Status: DC

## 2016-07-26 NOTE — Progress Notes (Signed)
Pre visit review using our clinic review tool, if applicable. No additional management support is needed unless otherwise documented below in the visit note. 

## 2016-07-26 NOTE — Patient Instructions (Signed)
Take care of yourself  Keep working on weight loss  Keep exercising  Pneumonia vaccine - PNA 23 today

## 2016-07-26 NOTE — Progress Notes (Signed)
Subjective:    Patient ID: Selena Johnson, female    DOB: Jan 04, 1951, 66 y.o.   MRN: [id number]  HPI Here for annual f/u of chronic medical conditions  Has been feeling ok  Got over a sinus problem and had possible flu   Wt Readings from Last 3 Encounters:  07/26/16 213 lb 8 oz (96.8 kg)  07/21/16 213 lb (96.6 kg)  04/15/16 216 lb (98 kg)  wt is down 3 lb from the fall  Taking care of herself  Not much exercise - needs to do some in the house  Does walk the old dog  bmi 34.9  Seen for [facility] on 2/15 Failed hearing  Screen worse on L  Has been eval by audiologist and hearing aide were not recommended  She only notices problems when they are stopped up (in or out)  Thinks it is full right now (L ear) - last time flushed 7/17   Hep C screen neg   Mammogram 9/17- nl  Self breast exam -no lumps   Pap/gyn care : pap nl in 09- had a hysterectomy for prolapse No problems / vag d/c / no new partners   Zoster vaccine 5/11 Flu vaccine 10/17 Tetanus vaccine 5/08 PNA- prevnar 2/16   Due for pna 23   Colonoscopy/ screening 11/17 polyps , 3-5 y recall   dexa 2/17 normal BMD No falls or fractures  Takes vit D (intol of ca)   bp is stable today  No cp or palpitations or headaches or edema  No side effects to medicines  BP Readings from Last 3 Encounters:  07/26/16 126/78  07/21/16 122/76  04/15/16 (!) 156/82     Metoprolol and maxzide and K   Chemistry      Component Value Date/Time   NA 140 07/21/2016 0950   K 3.8 07/21/2016 0950   CL 105 07/21/2016 0950   CO2 31 07/21/2016 0950   BUN 8 07/21/2016 0950   CREATININE 0.65 07/21/2016 0950      Component Value Date/Time   CALCIUM 9.4 07/21/2016 0950   ALKPHOS 82 07/21/2016 0950   AST 43 (H) 07/21/2016 0950   ALT 30 07/21/2016 0950   BILITOT 1.4 (H) 07/21/2016 0950       Hx of hepatic cirrhosis from NASH with ast/alt elevation  Platelets are 79- staying the same  Watching this -has seen hematology in the past    Hypothyroidism  Pt has no clinical changes No change in energy level/ skin/ edema and no tremor   More hair loss lately Lab Results  Component Value Date   TSH 2.04 07/21/2016      Sees derm / basal cell in the past  Had 2 moles removed on legs last - they were basal cell  She goes regularly   Hx of hyperlipidemia Lab Results  Component Value Date   CHOL 195 07/21/2016   CHOL 218 (H) 10/19/2015   CHOL 218 (H) 07/17/2015   Lab Results  Component Value Date   HDL 48.20 07/21/2016   HDL 52.50 10/19/2015   HDL 51.60 07/17/2015   Lab Results  Component Value Date   LDLCALC 125 (H) 07/21/2016   LDLCALC 148 (H) 10/19/2015   LDLCALC 146 (H) 07/17/2015   Lab Results  Component Value Date   TRIG 107.0 07/21/2016   TRIG 87.0 10/19/2015   TRIG 100.0 07/17/2015   Lab Results  Component Value Date   CHOLHDL 4 07/21/2016   CHOLHDL 4 10/19/2015  CHOLHDL 4 07/17/2015   Lab Results  Component Value Date   LDLDIRECT 150.1 07/08/2013   LDLDIRECT 144.9 01/02/2013   LDLDIRECT 139.8 07/06/2012    Not eating fatty meats  For protein she eats small pc cheese or yogurt / low fat milk  Nuts Selena Johnson   Patient Active Problem List   Diagnosis Date Noted  . Colon cancer screening 07/21/2015  . Estrogen deficiency 07/21/2015  . Calcification of right breast 05/06/2015  . Encounter for Medicare annual wellness exam 07/20/2014  . Unspecified constipation 10/25/2012  . Hepatic cirrhosis ([place]) 10/23/2012  . Hypokalemia 07/13/2012  . Thrombocytopenia ([place]) 11/10/2010  . Routine general medical examination at a health care facility 11/04/2010  . Hyperlipidemia 03/18/2008  . Obesity 03/18/2008  . TRANSAMINASES, SERUM, ELEVATED 03/18/2008  . MENOPAUSE-RELATED VASOMOTOR SYMPTOMS, HOT FLASHES 10/23/2006  . Hypothyroidism 10/11/2006  . GLAUCOMA 10/11/2006  . Essential hypertension 10/11/2006  . GERD 10/11/2006  . History of kidney stones 10/11/2006   Past Medical History:   Diagnosis Date  . Allergy   . Cancer ([place])    basal cell skin CA  . Chronic kidney disease    hx of kidney stones  . Conjunctivitis    chronic  . GERD (gastroesophageal reflux disease)   . Glaucoma   . Hearing loss in left ear   . Hyperlipidemia    no per pt  . Hypertension   . Menopausal syndrome   . NASH (nonalcoholic steatohepatitis)    with cirrhosis and mild esoph varicies  . Primary hypothyroidism    Past Surgical History:  Procedure Laterality Date  . ABDOMINAL HYSTERECTOMY     partial ? prolapse  . CHOLECYSTECTOMY    . ESOPHAGOGASTRODUODENOSCOPY    . TONSILLECTOMY     Social History  Substance Use Topics  . Smoking status: Former Research scientist (life sciences)  . Smokeless tobacco: Never Used  . Alcohol use 0.0 oz/week     Comment: wine- rare   Family History  Problem Relation Age of Onset  . Heart disease Mother     CAD  . Hypertension Mother   . Glaucoma Mother   . Heart disease Father     CAD  . Diabetes Father   . Hypertension Father   . Cancer Father     ? CA  . Colon cancer Neg Hx   . Esophageal cancer Neg Hx   . Rectal cancer Neg Hx   . Stomach cancer Neg Hx    Allergies  Allergen Reactions  . Codeine     REACTION: Throat swelling  . Ivp Dye [Iodinated Diagnostic Agents] Swelling   Current Outpatient Prescriptions on File Prior to Visit  Medication Sig Dispense Refill  . Biotin 2500 MCG CAPS Take 1 capsule by mouth daily.      Marland Kitchen loratadine (CLARITIN) 10 MG tablet Take 10 mg by mouth daily as needed.      . Multiple Vitamin (MULTIVITAMIN) capsule Take 1 capsule by mouth daily.       Current Facility-Administered Medications on File Prior to Visit  Medication Dose Route Frequency Provider Last Rate Last Dose  . 0.9 %  sodium chloride infusion  500 mL Intravenous Continuous Kavitha Nandigam V, MD        Review of Systems    Review of Systems  Constitutional: Negative for fever, appetite change, fatigue and unexpected weight change.  Eyes: Negative for pain  and visual disturbance.  Respiratory: Negative for cough and shortness of breath.   Cardiovascular: Negative for cp  or palpitations    Gastrointestinal: Negative for nausea, diarrhea and constipation.  Genitourinary: Negative for urgency and frequency.  Skin: Negative for pallor or rash   Neurological: Negative for weakness, light-headedness, numbness and headaches.  Hematological: Negative for adenopathy. Does not bruise/bleed easily.  Psychiatric/Behavioral: Negative for dysphoric mood. The patient is not nervous/anxious.      Objective:   Physical Exam  Constitutional: She appears well-developed and well-nourished. No distress.  obese and well appearing   HENT:  Head: Normocephalic and atraumatic.  Right Ear: External ear normal.  Left Ear: External ear normal.  Mouth/Throat: Oropharynx is clear and moist.  Eyes: Conjunctivae and EOM are normal. Pupils are equal, round, and reactive to light. No scleral icterus.  Neck: Normal range of motion. Neck supple. No JVD present. Carotid bruit is not present. No thyromegaly present.  Cardiovascular: Normal rate, regular rhythm, normal heart sounds and intact distal pulses.  Exam reveals no gallop.   Pulmonary/Chest: Effort normal and breath sounds normal. No respiratory distress. She has no wheezes. She exhibits no tenderness.  Abdominal: Soft. Bowel sounds are normal. She exhibits no distension, no abdominal bruit and no mass. There is no tenderness.  Genitourinary: No breast swelling, tenderness, discharge or bleeding.  Genitourinary Comments: Breast exam: No mass, nodules, thickening, tenderness, bulging, retraction, inflamation, nipple discharge or skin changes noted.  No axillary or clavicular LA.      Musculoskeletal: Normal range of motion. She exhibits no edema or tenderness.  Lymphadenopathy:    She has no cervical adenopathy.  Neurological: She is alert. She has normal reflexes. No cranial nerve deficit. She exhibits normal muscle  tone. Coordination normal.  Skin: Skin is warm and dry. No rash noted. No erythema. No pallor.  No telangectasias No jaundice  Some lentigines  Psychiatric: She has a normal mood and affect.          Assessment & Plan:   Problem List Items Addressed This Visit      Cardiovascular and Mediastinum   Essential hypertension - Primary    bp in fair control at this time  BP Readings from Last 1 Encounters:  07/26/16 126/78   No changes needed Disc lifstyle change with low sodium diet and exercise  Labs reviewed       Relevant Medications   metoprolol succinate (TOPROL-XL) 100 MG 24 hr tablet   triamterene-hydrochlorothiazide (MAXZIDE-25) 37.5-25 MG tablet     Digestive   Hepatic cirrhosis (HCC)    Platelets are stable at 79 Has seen GI and hematology in the past  Has NASH Enc wt loss  Liver tests are stable         Endocrine   Hypothyroidism    Hypothyroidism  Pt has no clinical changes No change in energy level/ hair or skin/ edema and no tremor Lab Results  Component Value Date   TSH 2.04 07/21/2016           Relevant Medications   metoprolol succinate (TOPROL-XL) 100 MG 24 hr tablet   levothyroxine (SYNTHROID) 175 MCG tablet     Other   Hyperlipidemia    Disc goals for lipids and reasons to control them Rev labs with pt- improved LDL with better diet Rev low sat fat diet in detail       Relevant Medications   metoprolol succinate (TOPROL-XL) 100 MG 24 hr tablet   triamterene-hydrochlorothiazide (MAXZIDE-25) 37.5-25 MG tablet   Obesity    Discussed how this problem influences overall health and the risks it  imposes  Reviewed plan for weight loss with lower calorie diet (via better food choices and also portion control or program like weight watchers) and exercise building up to or more than 30 minutes 5 days per week including some aerobic activity         Thrombocytopenia (HCC)    Stable with platelet of 79 in setting of cirrhosis  No bruising  or bleeding  Continue to watch  Has seen hematology in the past      TRANSAMINASES, SERUM, ELEVATED    From NASH Stable with AST of 43       Other Visit Diagnoses    Need for 23-polyvalent pneumococcal polysaccharide vaccine       Relevant Orders   Pneumococcal polysaccharide vaccine 23-valent greater than or equal to 2yo subcutaneous/IM (Completed)

## 2016-07-27 NOTE — Assessment & Plan Note (Signed)
From NASH Stable with AST of 43

## 2016-07-27 NOTE — Assessment & Plan Note (Signed)
Platelets are stable at 79 Has seen GI and hematology in the past  Has NASH Enc wt loss  Liver tests are stable

## 2016-07-27 NOTE — Assessment & Plan Note (Signed)
Discussed how this problem influences overall health and the risks it imposes  Reviewed plan for weight loss with lower calorie diet (via better food choices and also portion control or program like weight watchers) and exercise building up to or more than 30 minutes 5 days per week including some aerobic activity    

## 2016-07-27 NOTE — Assessment & Plan Note (Signed)
Hypothyroidism  Pt has no clinical changes No change in energy level/ hair or skin/ edema and no tremor Lab Results  Component Value Date   TSH 2.04 07/21/2016

## 2016-07-27 NOTE — Assessment & Plan Note (Signed)
Stable with platelet of 79 in setting of cirrhosis  No bruising or bleeding  Continue to watch  Has seen hematology in the past

## 2016-07-27 NOTE — Assessment & Plan Note (Signed)
Disc goals for lipids and reasons to control them Rev labs with pt- improved LDL with better diet Rev low sat fat diet in detail

## 2016-07-27 NOTE — Assessment & Plan Note (Signed)
bp in fair control at this time  BP Readings from Last 1 Encounters:  07/26/16 126/78   No changes needed Disc lifstyle change with low sodium diet and exercise  Labs reviewed

## 2017-01-04 DIAGNOSIS — H40003 Preglaucoma, unspecified, bilateral: Secondary | ICD-10-CM | POA: Diagnosis not present

## 2017-02-27 DIAGNOSIS — Z1231 Encounter for screening mammogram for malignant neoplasm of breast: Secondary | ICD-10-CM | POA: Diagnosis not present

## 2017-03-01 ENCOUNTER — Encounter: Payer: Self-pay | Admitting: Family Medicine

## 2017-03-15 DIAGNOSIS — L728 Other follicular cysts of the skin and subcutaneous tissue: Secondary | ICD-10-CM | POA: Diagnosis not present

## 2017-03-15 DIAGNOSIS — D485 Neoplasm of uncertain behavior of skin: Secondary | ICD-10-CM | POA: Diagnosis not present

## 2017-03-15 DIAGNOSIS — L821 Other seborrheic keratosis: Secondary | ICD-10-CM | POA: Diagnosis not present

## 2017-03-15 DIAGNOSIS — D1801 Hemangioma of skin and subcutaneous tissue: Secondary | ICD-10-CM | POA: Diagnosis not present

## 2017-03-15 DIAGNOSIS — D225 Melanocytic nevi of trunk: Secondary | ICD-10-CM | POA: Diagnosis not present

## 2017-03-15 DIAGNOSIS — L814 Other melanin hyperpigmentation: Secondary | ICD-10-CM | POA: Diagnosis not present

## 2017-03-15 DIAGNOSIS — L570 Actinic keratosis: Secondary | ICD-10-CM | POA: Diagnosis not present

## 2017-03-15 DIAGNOSIS — Z85828 Personal history of other malignant neoplasm of skin: Secondary | ICD-10-CM | POA: Diagnosis not present

## 2017-03-25 ENCOUNTER — Encounter: Payer: Self-pay | Admitting: Family Medicine

## 2017-05-04 ENCOUNTER — Telehealth: Payer: Self-pay | Admitting: Family Medicine

## 2017-05-04 DIAGNOSIS — Z1283 Encounter for screening for malignant neoplasm of skin: Secondary | ICD-10-CM

## 2017-05-04 NOTE — Telephone Encounter (Signed)
Copied from [place]. Topic: Referral - Request >> May 04, 2017  3:53 PM Selena Johnson, NT wrote: Reason for ZTA:EWYBR like a referral to Dr Kathleene Hazel in  [place] [phone], to check for skin cancer, she says it similar to one she has had before ,  please call Ms Scobey at  [phone] home ,cell [phone] please leave detailed message if no answer

## 2017-05-04 NOTE — Telephone Encounter (Signed)
Copied from [place]. Topic: Referral - Request >> May 04, 2017  3:53 PM Cecelia Byars, NT wrote: Reason for TGP:QDIYM like a referral to Dr Kathleene Hazel in  [place] [phone], to check for skin cancer, she says it similar to one she has had before ,  please call Ms Duffy at  [phone] home ,cell [phone] please leave detailed message if no answer

## 2017-05-05 DIAGNOSIS — Z1283 Encounter for screening for malignant neoplasm of skin: Secondary | ICD-10-CM | POA: Insufficient documentation

## 2017-05-05 NOTE — Telephone Encounter (Signed)
Ref done  Will route to [facility] 

## 2017-05-08 NOTE — Telephone Encounter (Signed)
Selena Johnson made patient an appt and patient is aware.

## 2017-06-08 DIAGNOSIS — Z23 Encounter for immunization: Secondary | ICD-10-CM | POA: Diagnosis not present

## 2017-07-15 ENCOUNTER — Telehealth: Payer: Self-pay | Admitting: Family Medicine

## 2017-07-15 DIAGNOSIS — E7800 Pure hypercholesterolemia, unspecified: Secondary | ICD-10-CM

## 2017-07-15 DIAGNOSIS — K7460 Unspecified cirrhosis of liver: Secondary | ICD-10-CM

## 2017-07-15 DIAGNOSIS — D696 Thrombocytopenia, unspecified: Secondary | ICD-10-CM

## 2017-07-15 DIAGNOSIS — E039 Hypothyroidism, unspecified: Secondary | ICD-10-CM

## 2017-07-15 DIAGNOSIS — I10 Essential (primary) hypertension: Secondary | ICD-10-CM

## 2017-07-15 NOTE — Telephone Encounter (Signed)
-----   Message from Eustace Pen, LPN sent at 11/12/4096 11:21 PM EST ----- Regarding: Labs 2/15 Lab orders needed. Thank you.  Insurance:  Medicare - traditional

## 2017-07-24 ENCOUNTER — Ambulatory Visit (INDEPENDENT_AMBULATORY_CARE_PROVIDER_SITE_OTHER): Payer: Medicare Other

## 2017-07-24 VITALS — BP 130/90 | HR 59 | Temp 97.7°F | Ht 65.0 in | Wt 202.5 lb

## 2017-07-24 DIAGNOSIS — I10 Essential (primary) hypertension: Secondary | ICD-10-CM | POA: Diagnosis not present

## 2017-07-24 DIAGNOSIS — E039 Hypothyroidism, unspecified: Secondary | ICD-10-CM

## 2017-07-24 DIAGNOSIS — E7800 Pure hypercholesterolemia, unspecified: Secondary | ICD-10-CM

## 2017-07-24 DIAGNOSIS — K7460 Unspecified cirrhosis of liver: Secondary | ICD-10-CM | POA: Diagnosis not present

## 2017-07-24 DIAGNOSIS — D696 Thrombocytopenia, unspecified: Secondary | ICD-10-CM

## 2017-07-24 DIAGNOSIS — Z0000 Encounter for general adult medical examination without abnormal findings: Secondary | ICD-10-CM

## 2017-07-24 LAB — CBC WITH DIFFERENTIAL/PLATELET
BASOS ABS: 0 10*3/uL (ref 0.0–0.1)
Basophils Relative: 0.5 % (ref 0.0–3.0)
Eosinophils Absolute: 0.2 10*3/uL (ref 0.0–0.7)
Eosinophils Relative: 4.5 % (ref 0.0–5.0)
HCT: 43.6 % (ref 36.0–46.0)
Hemoglobin: 15.1 g/dL — ABNORMAL HIGH (ref 12.0–15.0)
LYMPHS ABS: 1 10*3/uL (ref 0.7–4.0)
Lymphocytes Relative: 24.1 % (ref 12.0–46.0)
MCHC: 34.5 g/dL (ref 30.0–36.0)
MCV: 101 fl — AB (ref 78.0–100.0)
MONOS PCT: 7.4 % (ref 3.0–12.0)
Monocytes Absolute: 0.3 10*3/uL (ref 0.1–1.0)
NEUTROS PCT: 63.5 % (ref 43.0–77.0)
Neutro Abs: 2.7 10*3/uL (ref 1.4–7.7)
Platelets: 67 10*3/uL — ABNORMAL LOW (ref 150.0–400.0)
RBC: 4.32 Mil/uL (ref 3.87–5.11)
RDW: 13 % (ref 11.5–15.5)
WBC: 4.3 10*3/uL (ref 4.0–10.5)

## 2017-07-24 LAB — LIPID PANEL
CHOLESTEROL: 212 mg/dL — AB (ref 0–200)
HDL: 59.8 mg/dL (ref >39.00)
LDL Cholesterol: 137 mg/dL — ABNORMAL HIGH (ref 0–99)
NONHDL: 152.03
Total CHOL/HDL Ratio: 4
Triglycerides: 75 mg/dL (ref 0.0–149.0)
VLDL: 15 mg/dL (ref 0.0–40.0)

## 2017-07-24 LAB — COMPREHENSIVE METABOLIC PANEL
ALK PHOS: 104 U/L (ref 39–117)
ALT: 30 U/L (ref 0–35)
AST: 46 U/L — ABNORMAL HIGH (ref 0–37)
Albumin: 4 g/dL (ref 3.5–5.2)
BILIRUBIN TOTAL: 1.7 mg/dL — AB (ref 0.2–1.2)
BUN: 11 mg/dL (ref 6–23)
CALCIUM: 9.4 mg/dL (ref 8.4–10.5)
CO2: 29 mEq/L (ref 19–32)
Chloride: 106 mEq/L (ref 96–112)
Creatinine, Ser: 0.68 mg/dL (ref 0.40–1.20)
GFR: 91.24 mL/min (ref >60.00)
GLUCOSE: 104 mg/dL — AB (ref 70–99)
Potassium: 3.8 mEq/L (ref 3.5–5.1)
Sodium: 141 mEq/L (ref 135–145)
TOTAL PROTEIN: 6.7 g/dL (ref 6.0–8.3)

## 2017-07-24 LAB — TSH: TSH: 0.67 u[IU]/mL (ref 0.35–4.50)

## 2017-07-24 NOTE — Progress Notes (Signed)
Pre visit review using our clinic review tool, if applicable. No additional management support is needed unless otherwise documented below in the visit note. 

## 2017-07-24 NOTE — Progress Notes (Signed)
PCP notes:   Health maintenance:  Tetanus vaccine - postponed/insurance  Abnormal screenings:   Hearing - failed  Hearing Screening   125Hz  250Hz  500Hz  1000Hz  2000Hz  3000Hz  4000Hz  6000Hz  8000Hz   Right ear:   40 40 40  0    Left ear:   0 0 0  0     Patient concerns:   None  Nurse concerns:  I reviewed health advisor's note, was available for consultation, and agree with documentation and plan. Loura Pardon MD   None  Next PCP appt:   07/28/17 @ 1130

## 2017-07-24 NOTE — Patient Instructions (Signed)
Selena Johnson , Thank you for taking time to come for your Medicare Wellness Visit. I appreciate your ongoing commitment to your health goals. Please review the following plan we discussed and let me know if I can assist you in the future.   These are the goals we discussed: Goals    . Increase physical activity     Starting 07/24/2017, I will continue to walk 20-30 minutes daily.        This is a list of the screening recommended for you and due dates:  Health Maintenance  Topic Date Due  . Tetanus Vaccine  07/24/2018*  . Mammogram  02/27/2018  . Colon Cancer Screening  04/15/2021  . Flu Shot  Completed  . DEXA scan (bone density measurement)  Completed  .  Hepatitis C: One time screening is recommended by Center for Disease Control  (CDC) for  adults born from [birth year] through 1967.   Completed  . Pneumonia vaccines  Completed  *Topic was postponed. The date shown is not the original due date.   Preventive Care for Adults  A healthy lifestyle and preventive care can promote health and wellness. Preventive health guidelines for adults include the following key practices.  . A routine yearly physical is a good way to check with your health care provider about your health and preventive screening. It is a chance to share any concerns and updates on your health and to receive a thorough exam.  . Visit your dentist for a routine exam and preventive care every 6 months. Brush your teeth twice a day and floss once a day. Good oral hygiene prevents tooth decay and gum disease.  . The frequency of eye exams is based on your age, health, family medical history, use  of contact lenses, and other factors. Follow your health care provider's recommendations for frequency of eye exams.  . Eat a healthy diet. Foods like vegetables, fruits, whole grains, low-fat dairy products, and lean protein foods contain the nutrients you need without too many calories. Decrease your intake of foods high in solid  fats, added sugars, and salt. Eat the right amount of calories for you. Get information about a proper diet from your health care provider, if necessary.  . Regular physical exercise is one of the most important things you can do for your health. Most adults should get at least 150 minutes of moderate-intensity exercise (any activity that increases your heart rate and causes you to sweat) each week. In addition, most adults need muscle-strengthening exercises on 2 or more days a week.  Silver Sneakers may be a benefit available to you. To determine eligibility, you may visit the website: www.silversneakers.com or contact program at 531-574-5568 Mon-Fri between 8AM-8PM.   . Maintain a healthy weight. The body mass index (BMI) is a screening tool to identify possible weight problems. It provides an estimate of body fat based on height and weight. Your health care provider can find your BMI and can help you achieve or maintain a healthy weight.   For adults 20 years and older: ? A BMI below 18.5 is considered underweight. ? A BMI of 18.5 to 24.9 is normal. ? A BMI of 25 to 29.9 is considered overweight. ? A BMI of 30 and above is considered obese.   . Maintain normal blood lipids and cholesterol levels by exercising and minimizing your intake of saturated fat. Eat a balanced diet with plenty of fruit and vegetables. Blood tests for lipids and cholesterol should begin  at age 58 and be repeated every 5 years. If your lipid or cholesterol levels are high, you are over 50, or you are at high risk for heart disease, you may need your cholesterol levels checked more frequently. Ongoing high lipid and cholesterol levels should be treated with medicines if diet and exercise are not working.  . If you smoke, find out from your health care provider how to quit. If you do not use tobacco, please do not start.  . If you choose to drink alcohol, please do not consume more than 2 drinks per day. One drink is  considered to be 12 ounces (355 mL) of beer, 5 ounces (148 mL) of wine, or 1.5 ounces (44 mL) of liquor.  . If you are 60-40 years old, ask your health care provider if you should take aspirin to prevent strokes.  . Use sunscreen. Apply sunscreen liberally and repeatedly throughout the day. You should seek shade when your shadow is shorter than you. Protect yourself by wearing long sleeves, pants, a wide-brimmed hat, and sunglasses year round, whenever you are outdoors.  . Once a month, do a whole body skin exam, using a mirror to look at the skin on your back. Tell your health care provider of new moles, moles that have irregular borders, moles that are larger than a pencil eraser, or moles that have changed in shape or color.

## 2017-07-24 NOTE — Progress Notes (Signed)
Subjective:   Selena Johnson is a 67 y.o. female who presents for Medicare Annual (Subsequent) preventive examination.  Review of Systems:  N/A Cardiac Risk Factors include: advanced age (>24mn, >>38women);obesity (BMI >30kg/m2);dyslipidemia;hypertension     Objective:     Vitals: BP 130/90 (BP Location: Right Arm, Patient Position: Sitting, Cuff Size: Normal)   Pulse (!) 59   Temp 97.7 F (36.5 C) (Oral)   Ht 5' 5"  (1.651 m) Comment: no shoes  Wt 202 lb 8 oz (91.9 kg)   SpO2 98%   BMI 33.70 kg/m   Body mass index is 33.7 kg/m.  Advanced Directives 07/24/2017 07/21/2016 04/15/2016 04/01/2016  Does Patient Have a Medical Advance Directive? Yes Yes Yes Yes  Type of AParamedicof ANags HeadLiving will [place] will - [place] will  Copy of [place] Chart? No - copy requested Yes No - copy requested No - copy requested    Tobacco Social History   Tobacco Use  Smoking Status Former Smoker  Smokeless Tobacco Never Used     Counseling given: No   Clinical Intake:  Pre-visit preparation completed: Yes  Pain : No/denies pain Pain Score: 0-No pain     Nutritional Status: BMI > 30  Obese Nutritional Risks: None Diabetes: No  How often do you need to have someone help you when you read instructions, pamphlets, or other written materials from your doctor or pharmacy?: 1 - Never What is the last grade level you completed in school?: Bachelor degree  Interpreter Needed?: No  Comments: pt lives with spouse Information entered by :: LPinson, LPN  Past Medical History:  Diagnosis Date  . Allergy   . Cancer [place]    basal cell skin CA  . Chronic kidney disease    hx of kidney stones  . Conjunctivitis    chronic  . GERD (gastroesophageal reflux disease)   . Glaucoma   . Hearing loss in left ear   . Hyperlipidemia    no per pt  . Hypertension   . Menopausal  syndrome   . NASH (nonalcoholic steatohepatitis)    with cirrhosis and mild esoph varicies  . Primary hypothyroidism    Past Surgical History:  Procedure Laterality Date  . ABDOMINAL HYSTERECTOMY     partial ? prolapse  . CHOLECYSTECTOMY    . ESOPHAGOGASTRODUODENOSCOPY    . TONSILLECTOMY     Family History  Problem Relation Age of Onset  . Heart disease Mother        CAD  . Hypertension Mother   . Glaucoma Mother   . Heart disease Father        CAD  . Diabetes Father   . Hypertension Father   . Cancer Father        ? CA  . Colon cancer Neg Hx   . Esophageal cancer Neg Hx   . Rectal cancer Neg Hx   . Stomach cancer Neg Hx    Social History   Socioeconomic History  . Marital status: Married    Spouse name: None  . Number of children: None  . Years of education: None  . Highest education level: None  Social Needs  . Financial resource strain: None  . Food insecurity - worry: None  . Food insecurity - inability: None  . Transportation needs - medical: None  . Transportation needs - non-medical: None  Occupational History  . None  Tobacco Use  .  Smoking status: Former Research scientist (life sciences)  . Smokeless tobacco: Never Used  Substance and Sexual Activity  . Alcohol use: Yes    Alcohol/week: 0.0 oz    Comment: wine- rare  . Drug use: No  . Sexual activity: None  Other Topics Concern  . None  Social History Narrative  . None    Outpatient Encounter Medications as of 07/24/2017  Medication Sig  . Biotin 2500 MCG CAPS Take 1 capsule by mouth daily.    . Cholecalciferol (VITAMIN D3) 2000 units TABS Take 1 tablet by mouth daily.  Marland Kitchen levothyroxine (SYNTHROID) 175 MCG tablet Take 1 tablet (175 mcg total) by mouth daily before breakfast.  . loratadine (CLARITIN) 10 MG tablet Take 10 mg by mouth daily as needed.    . metoprolol succinate (TOPROL-XL) 100 MG 24 hr tablet TAKE 1 TABLET BY MOUTH EVERY DAY WITH A MEAL  . Multiple Vitamin (MULTIVITAMIN) capsule Take 1 capsule by mouth  daily.    . potassium chloride SA (KLOR-CON M20) 20 MEQ tablet TAKE ONE TABLET BY MOUTH EVERY DAY  . triamterene-hydrochlorothiazide (MAXZIDE-25) 37.5-25 MG tablet Take 1 tablet by mouth daily.   Facility-Administered Encounter Medications as of 07/24/2017  Medication  . 0.9 %  sodium chloride infusion    Activities of Daily Living In your present state of health, do you have any difficulty performing the following activities: 07/24/2017  Hearing? N  Vision? N  Difficulty concentrating or making decisions? N  Walking or climbing stairs? N  Dressing or bathing? N  Doing errands, shopping? N  Preparing Food and eating ? N  Using the Toilet? N  In the past six months, have you accidently leaked urine? N  Do you have problems with loss of bowel control? N  Managing your Medications? N  Managing your Finances? N  Housekeeping or managing your Housekeeping? N  Some recent data might be hidden    Patient Care Team: Tower, [name], MD as PCP - General Vin-Parikh, Deirdre Peer, MD as Referring Physician (Ophthalmology) Glennie Isle, PA-C as Physician Assistant (Physician Assistant)    Assessment:   This is a routine wellness examination for [place].  Exercise Activities and Dietary recommendations Current Exercise Habits: Home exercise routine, Type of exercise: walking, Time (Minutes): 30, Intensity: Mild, Exercise limited by: None identified  Goals    . Increase physical activity     Starting 07/24/2017, I will continue to walk 20-30 minutes daily.        Fall Risk Fall Risk  07/24/2017 07/21/2016 07/21/2015 07/18/2014  Falls in the past year? No No No No   Depression Screen PHQ 2/9 Scores 07/24/2017 07/21/2016 07/21/2015 07/18/2014  PHQ - 2 Score 0 0 0 0  PHQ- 9 Score 0 - - -     Cognitive Function MMSE - Mini Mental State Exam 07/24/2017 07/21/2016  Orientation to time 5 5  Orientation to Place 5 5  Registration 3 3  Attention/ Calculation 0 0  Recall 3 3  Language-  name 2 objects 0 0  Language- repeat 1 1  Language- follow 3 step command 3 3  Language- read & follow direction 0 0  Write a sentence 0 0  Copy design 0 0  Total score 20 20     PLEASE NOTE: A Mini-Cog screen was completed. Maximum score is 20. A value of 0 denotes this part of [place] MMSE was not completed or the patient failed this part of the Mini-Cog screening.   Mini-Cog Screening Orientation  to Time - Max 5 pts Orientation to Place - Max 5 pts Registration - Max 3 pts Recall - Max 3 pts Language Repeat - Max 1 pts Language Follow 3 Step Command - Max 3 pts     Immunization History  Administered Date(s) Administered  . Hep A / Hep B 10/25/2012, 11/01/2012, 11/26/2012, 10/29/2013  . Influenza Split 03/03/2011  . Influenza Whole 03/16/2007, 03/10/2008, 03/18/2009, 03/31/2010  . Influenza-Unspecified 03/07/2014, 04/03/2015, 03/14/2016, 03/22/2017  . Pneumococcal Conjugate-13 07/18/2014  . Pneumococcal Polysaccharide-23 10/25/2012, 07/26/2016  . Td 06/06/1996, 10/23/2006  . Zoster 11/03/2009   Screening Tests Health Maintenance  Topic Date Due  . TETANUS/TDAP  07/24/2018 (Originally 10/22/2016)  . MAMMOGRAM  02/27/2018  . COLONOSCOPY  04/15/2021  . INFLUENZA VACCINE  Completed  . DEXA SCAN  Completed  . Hepatitis C Screening  Completed  . PNA vac Low Risk Adult  Completed      Plan:     I have personally reviewed, addressed, and noted the following in the patient's chart:  A. Medical and social history B. Use of alcohol, tobacco or illicit drugs  C. Current medications and supplements D. Functional ability and status E.  Nutritional status F.  Physical activity G. Advance directives H. List of other physicians I.  Hospitalizations, surgeries, and ER visits in previous 12 months J.  Reserve to include hearing, vision, cognitive, depression L. Referrals and appointments - none  In addition, I have reviewed and discussed with patient certain  preventive protocols, quality metrics, and best practice recommendations. A written personalized care plan for preventive services as well as general preventive health recommendations were provided to patient.  See attached scanned questionnaire for additional information.   Signed,   Lindell Noe, MHA, BS, LPN Health Coach

## 2017-07-28 ENCOUNTER — Encounter: Payer: Self-pay | Admitting: Family Medicine

## 2017-07-28 ENCOUNTER — Ambulatory Visit (INDEPENDENT_AMBULATORY_CARE_PROVIDER_SITE_OTHER): Payer: Medicare Other | Admitting: Family Medicine

## 2017-07-28 VITALS — BP 128/72 | HR 66 | Temp 98.4°F | Ht 65.0 in | Wt 204.5 lb

## 2017-07-28 DIAGNOSIS — E7800 Pure hypercholesterolemia, unspecified: Secondary | ICD-10-CM

## 2017-07-28 DIAGNOSIS — D696 Thrombocytopenia, unspecified: Secondary | ICD-10-CM

## 2017-07-28 DIAGNOSIS — R7309 Other abnormal glucose: Secondary | ICD-10-CM | POA: Insufficient documentation

## 2017-07-28 DIAGNOSIS — I10 Essential (primary) hypertension: Secondary | ICD-10-CM | POA: Diagnosis not present

## 2017-07-28 DIAGNOSIS — K7460 Unspecified cirrhosis of liver: Secondary | ICD-10-CM | POA: Diagnosis not present

## 2017-07-28 DIAGNOSIS — E039 Hypothyroidism, unspecified: Secondary | ICD-10-CM

## 2017-07-28 DIAGNOSIS — Z6834 Body mass index (BMI) 34.0-34.9, adult: Secondary | ICD-10-CM

## 2017-07-28 DIAGNOSIS — E6609 Other obesity due to excess calories: Secondary | ICD-10-CM | POA: Diagnosis not present

## 2017-07-28 MED ORDER — POTASSIUM CHLORIDE CRYS ER 20 MEQ PO TBCR: EXTENDED_RELEASE_TABLET | ORAL | 3 refills | Status: DC

## 2017-07-28 MED ORDER — TRIAMTERENE-HCTZ 37.5-25 MG PO TABS: 1.0000 | ORAL_TABLET | Freq: Every day | ORAL | 3 refills | Status: DC

## 2017-07-28 MED ORDER — LEVOTHYROXINE SODIUM 175 MCG PO TABS: 175.0000 ug | ORAL_TABLET | Freq: Every day | ORAL | 3 refills | Status: DC

## 2017-07-28 MED ORDER — METOPROLOL SUCCINATE ER 100 MG PO TB24: ORAL_TABLET | ORAL | 3 refills | Status: DC

## 2017-07-28 NOTE — Patient Instructions (Signed)
For cholesterol   Avoid red meat/ fried foods/ egg yolks/ fatty breakfast meats/ butter, cheese and high fat dairy/ and shellfish    Take care of yourself  Think about an exercise program  Keep doing weight watchers   Labs are fairly stable

## 2017-07-28 NOTE — Progress Notes (Signed)
Subjective:    Patient ID: Selena Johnson, female    DOB: [birth year], 67 y.o.   MRN: [id number]  HPI Here for annual f/u of chronic health problems   Feeling fine overall  Arthritis is worse / esp with weather (in big joints) Overdid it and hurt her R shoulder    Wt Readings from Last 3 Encounters:  07/28/17 204 lb 8 oz (92.8 kg)  07/24/17 202 lb 8 oz (91.9 kg)  07/26/16 213 lb 8 oz (96.8 kg)  wt is down from a year ago - she is trying weight watchers  Likes to walk - 30 min if she can when the weather permits  Does some squats at home  Needs more indoor exercise  34.03 kg/m   Had amw on 2/18 Tetanus vaccine postponed - then she got it the 20 th outside the office  Hearing-failed in L ear (per past she was seen by audiology and hearing aide was not recommended)   Mammogram 9/18 -nl  Self breast exam -no lumps   H/o hysterectomy in the past   zostavax 5/11  (on a wait list for the shingrix)  Tetanus vaccine 07/26/17  Colonoscopy 11/17 polyps with 3-5 y recall   dexa 2/17 nl BMD Takes D (intol of ca)  No falls   bp is stable today  No cp or palpitations or headaches or edema  No side effects to medicines  BP Readings from Last 3 Encounters:  07/28/17 128/72  07/24/17 130/90  07/26/16 126/78     Lab Results  Component Value Date   CREATININE 0.68 07/24/2017   BUN 11 07/24/2017   NA 141 07/24/2017   K 3.8 07/24/2017   CL 106 07/24/2017   CO2 29 07/24/2017    Lab Results  Component Value Date   ALT 30 07/24/2017   AST 46 (H) 07/24/2017   ALKPHOS 104 07/24/2017   BILITOT 1.7 (H) 07/24/2017   hx of hepatic cirrhosis from NASH Keeps working on weight loss    Lab Results  Component Value Date   WBC 4.3 07/24/2017   HGB 15.1 (H) 07/24/2017   HCT 43.6 07/24/2017   MCV 101.0 (H) 07/24/2017   PLT 67.0 (L) 07/24/2017  h/o thrombycytopenia Has seen hematology  No bruising or bleeding  Fairly stable   Hypothyroidism  Pt has no clinical changes No  change in energy level/ hair or skin/ edema and no tremor Lab Results  Component Value Date   TSH 0.67 07/24/2017      Hyperlipidemia  Lab Results  Component Value Date   CHOL 212 (H) 07/24/2017   CHOL 195 07/21/2016   CHOL 218 (H) 10/19/2015   Lab Results  Component Value Date   HDL 59.80 07/24/2017   HDL 48.20 07/21/2016   HDL 52.50 10/19/2015   Lab Results  Component Value Date   LDLCALC 137 (H) 07/24/2017   LDLCALC 125 (H) 07/21/2016   LDLCALC 148 (H) 10/19/2015   Lab Results  Component Value Date   TRIG 75.0 07/24/2017   TRIG 107.0 07/21/2016   TRIG 87.0 10/19/2015   Lab Results  Component Value Date   CHOLHDL 4 07/24/2017   CHOLHDL 4 07/21/2016   CHOLHDL 4 10/19/2015   Lab Results  Component Value Date   LDLDIRECT 150.1 07/08/2013   LDLDIRECT 144.9 01/02/2013   LDLDIRECT 139.8 07/06/2012   Loves shellfish but does not eat it a lot / eats some fish with fins  Not much beef-dislikes it  LDL is up to 137   Glucose is 104 -watching this    Patient Active Problem List   Diagnosis Date Noted  . Elevated random blood glucose level 07/28/2017  . Skin cancer screening 05/05/2017  . Colon cancer screening 07/21/2015  . Estrogen deficiency 07/21/2015  . Calcification of right breast 05/06/2015  . Encounter for Medicare annual wellness exam 07/20/2014  . Unspecified constipation 10/25/2012  . Hepatic cirrhosis ([place]) 10/23/2012  . Hypokalemia 07/13/2012  . Thrombocytopenia ([place]) 11/10/2010  . Routine general medical examination at a health care facility 11/04/2010  . Hyperlipidemia 03/18/2008  . Obesity 03/18/2008  . TRANSAMINASES, SERUM, ELEVATED 03/18/2008  . MENOPAUSE-RELATED VASOMOTOR SYMPTOMS, HOT FLASHES 10/23/2006  . Hypothyroidism 10/11/2006  . GLAUCOMA 10/11/2006  . Essential hypertension 10/11/2006  . GERD 10/11/2006  . History of kidney stones 10/11/2006   Past Medical History:  Diagnosis Date  . Allergy   . Cancer ([place])    basal cell  skin CA  . Chronic kidney disease    hx of kidney stones  . Conjunctivitis    chronic  . GERD (gastroesophageal reflux disease)   . Glaucoma   . Hearing loss in left ear   . Hyperlipidemia    no per pt  . Hypertension   . Menopausal syndrome   . NASH (nonalcoholic steatohepatitis)    with cirrhosis and mild esoph varicies  . Primary hypothyroidism    Past Surgical History:  Procedure Laterality Date  . ABDOMINAL HYSTERECTOMY     partial ? prolapse  . CHOLECYSTECTOMY    . ESOPHAGOGASTRODUODENOSCOPY    . TONSILLECTOMY     Social History   Tobacco Use  . Smoking status: Former Research scientist (life sciences)  . Smokeless tobacco: Never Used  Substance Use Topics  . Alcohol use: Yes    Alcohol/week: 0.0 oz    Comment: wine- rare  . Drug use: No   Family History  Problem Relation Age of Onset  . Heart disease Mother        CAD  . Hypertension Mother   . Glaucoma Mother   . Heart disease Father        CAD  . Diabetes Father   . Hypertension Father   . Cancer Father        ? CA  . Colon cancer Neg Hx   . Esophageal cancer Neg Hx   . Rectal cancer Neg Hx   . Stomach cancer Neg Hx    Allergies  Allergen Reactions  . Codeine     REACTION: Throat swelling  . Ivp Dye [Iodinated Diagnostic Agents] Swelling   Current Outpatient Medications on File Prior to Visit  Medication Sig Dispense Refill  . Biotin 2500 MCG CAPS Take 1 capsule by mouth daily.      . Cholecalciferol (VITAMIN D3) 2000 units TABS Take 1 tablet by mouth daily.    Marland Kitchen loratadine (CLARITIN) 10 MG tablet Take 10 mg by mouth daily as needed.      . Multiple Vitamin (MULTIVITAMIN) capsule Take 1 capsule by mouth daily.       Current Facility-Administered Medications on File Prior to Visit  Medication Dose Route Frequency Provider Last Rate Last Dose  . 0.9 %  sodium chloride infusion  500 mL Intravenous Continuous Nandigam, Kavitha V, MD        Review of Systems  Constitutional: Negative for activity change, appetite change,  fatigue, fever and unexpected weight change.  HENT: Negative for congestion, ear pain, rhinorrhea, sinus pressure and sore  throat.   Eyes: Negative for pain, redness and visual disturbance.  Respiratory: Negative for cough, shortness of breath and wheezing.   Cardiovascular: Negative for chest pain and palpitations.  Gastrointestinal: Negative for abdominal pain, blood in stool, constipation and diarrhea.  Endocrine: Negative for polydipsia and polyuria.  Genitourinary: Negative for dysuria, frequency and urgency.  Musculoskeletal: Positive for arthralgias. Negative for back pain and myalgias.  Skin: Negative for pallor and rash.  Allergic/Immunologic: Negative for environmental allergies.  Neurological: Negative for dizziness, syncope and headaches.  Hematological: Negative for adenopathy. Does not bruise/bleed easily.  Psychiatric/Behavioral: Negative for decreased concentration and dysphoric mood. The patient is not nervous/anxious.        Objective:   Physical Exam  Constitutional: She appears well-developed and well-nourished. No distress.  obese and well appearing     HENT:  Head: Normocephalic and atraumatic.  Right Ear: External ear normal.  Left Ear: External ear normal.  Mouth/Throat: Oropharynx is clear and moist.  Eyes: EOM are normal. Pupils are equal, round, and reactive to light. No scleral icterus.  Baseline conj inj  Neck: Normal range of motion. Neck supple. No JVD present. Carotid bruit is not present. No thyromegaly present.  Cardiovascular: Normal rate, regular rhythm, normal heart sounds and intact distal pulses. Exam reveals no gallop.  Pulmonary/Chest: Effort normal and breath sounds normal. No respiratory distress. She has no wheezes. She exhibits no tenderness.  Abdominal: Soft. Bowel sounds are normal. She exhibits no distension, no abdominal bruit and no mass. There is no tenderness.  Genitourinary: No breast swelling, tenderness, discharge or bleeding.    Genitourinary Comments: Breast exam: No mass, nodules, thickening, tenderness, bulging, retraction, inflamation, nipple discharge or skin changes noted.  No axillary or clavicular LA.      Musculoskeletal: Normal range of motion. She exhibits no edema or tenderness.  Lymphadenopathy:    She has no cervical adenopathy.  Neurological: She is alert. She has normal reflexes. No cranial nerve deficit. She exhibits normal muscle tone. Coordination normal.  Skin: Skin is warm and dry. No rash noted. No erythema. No pallor.  Solar lentigines diffusely   Psychiatric: She has a normal mood and affect.          Assessment & Plan:   Problem List Items Addressed This Visit      Cardiovascular and Mediastinum   Essential hypertension - Primary    bp in fair control at this time  BP Readings from Last 1 Encounters:  07/28/17 128/72   No changes needed Disc lifstyle change with low sodium diet and exercise  Labs reviewed       Relevant Medications   metoprolol succinate (TOPROL-XL) 100 MG 24 hr tablet   triamterene-hydrochlorothiazide (MAXZIDE-25) 37.5-25 MG tablet     Digestive   Hepatic cirrhosis ([place])    Labs are fairly stable  No symptoms  Watched by GI for NASH Working on weight loss and commended effort         Endocrine   Hypothyroidism    . Hypothyroidism  Pt has no clinical changes No change in energy level/ hair or skin/ edema and no tremor Lab Results  Component Value Date   TSH 0.67 07/24/2017          Relevant Medications   levothyroxine (SYNTHROID) 175 MCG tablet   metoprolol succinate (TOPROL-XL) 100 MG 24 hr tablet     Other   Elevated random blood glucose level    Will watch this -glucose 104  Plan to check  A1C next time disc imp of low glycemic diet and wt loss to prevent DM2       Hyperlipidemia    Disc goals for lipids and reasons to control them Rev labs with pt Rev low sat fat diet in detail LDL up to 137-watching this - diet is improved  with wt watchers Consider statin if worse or no imp next check      Relevant Medications   metoprolol succinate (TOPROL-XL) 100 MG 24 hr tablet   triamterene-hydrochlorothiazide (MAXZIDE-25) 37.5-25 MG tablet   Obesity    Discussed how this problem influences overall health and the risks it imposes  Reviewed plan for weight loss with lower calorie diet (via better food choices and also portion control or program like weight watchers) and exercise building up to or more than 30 minutes 5 days per week including some aerobic activity   Commended on work with wt watchers Plan made for exercise       Thrombocytopenia ([place])    Fairly stable with plt of 67 this check  Has NASH  Low threshold for heme f/u if needed No bruising/bleeding or clots

## 2017-07-29 NOTE — Assessment & Plan Note (Signed)
bp in fair control at this time  BP Readings from Last 1 Encounters:  07/28/17 128/72   No changes needed Disc lifstyle change with low sodium diet and exercise  Labs reviewed

## 2017-07-29 NOTE — Assessment & Plan Note (Signed)
Discussed how this problem influences overall health and the risks it imposes  Reviewed plan for weight loss with lower calorie diet (via better food choices and also portion control or program like weight watchers) and exercise building up to or more than 30 minutes 5 days per week including some aerobic activity   Commended on work with wt watchers Plan made for exercise

## 2017-07-29 NOTE — Assessment & Plan Note (Signed)
Fairly stable with plt of 67 this check  Has NASH  Low threshold for heme f/u if needed No bruising/bleeding or clots

## 2017-07-29 NOTE — Assessment & Plan Note (Signed)
Disc goals for lipids and reasons to control them Rev labs with pt Rev low sat fat diet in detail LDL up to 137-watching this - diet is improved with wt watchers Consider statin if worse or no imp next check

## 2017-07-29 NOTE — Assessment & Plan Note (Signed)
.   Hypothyroidism  Pt has no clinical changes No change in energy level/ hair or skin/ edema and no tremor Lab Results  Component Value Date   TSH 0.67 07/24/2017

## 2017-07-29 NOTE — Assessment & Plan Note (Signed)
Will watch this -glucose 104  Plan to check A1C next time disc imp of low glycemic diet and wt loss to prevent DM2

## 2017-07-29 NOTE — Assessment & Plan Note (Signed)
Labs are fairly stable  No symptoms  Watched by GI for NASH Working on weight loss and commended effort

## 2017-08-16 ENCOUNTER — Encounter: Payer: Self-pay | Admitting: Family Medicine

## 2017-08-16 ENCOUNTER — Other Ambulatory Visit: Payer: Self-pay

## 2017-08-16 ENCOUNTER — Ambulatory Visit (INDEPENDENT_AMBULATORY_CARE_PROVIDER_SITE_OTHER): Payer: Medicare Other | Admitting: Family Medicine

## 2017-08-16 VITALS — BP 100/70 | HR 60 | Temp 98.3°F | Ht 65.0 in | Wt 203.2 lb

## 2017-08-16 DIAGNOSIS — M7541 Impingement syndrome of right shoulder: Secondary | ICD-10-CM | POA: Diagnosis not present

## 2017-08-16 DIAGNOSIS — M7581 Other shoulder lesions, right shoulder: Secondary | ICD-10-CM

## 2017-08-16 NOTE — Progress Notes (Signed)
Dr. Frederico Hamman T. Shanena Pellegrino, MD, [facility] [place], [address] Phone: [phone] Fax: [phone]  08/16/2017  Patient: Selena Johnson, MRN: [id number], DOB: 1950/07/04, 67 y.o.  Primary Physician:  Tower, [name], MD   Chief Complaint  Patient presents with  . Shoulder Pain    Right x 3 to 4 months.  Hurt pulling wires in attic   Subjective:   This 67 y.o. female patient noted above presents with shoulder pain that has been ongoing for 3-4 mo. The patient denies neck pain or radicular symptoms. Denies dislocation, subluxation, separation of the shoulder. The patient does complain of pain in the overhead plane with significant painful arc of motion.  Abd will pull and it will pop. Was doing some wires in the attic and one day a job ended up being about a four day job. Has been hurting for three or four years.   No old injuries.   Tried a heating pad and that is it.   Medications Tried: Tylenol, NSAIDS Ice or Heat: minimally helpful Tried PT: No  Prior shoulder Injury: No Prior surgery: No Prior fracture: No  The PMH, PSH, Social History, Family History, Medications, and allergies have been reviewed in [facility], and have been updated if relevant.  Patient Active Problem List   Diagnosis Date Noted  . Elevated random blood glucose level 07/28/2017  . Skin cancer screening 05/05/2017  . Colon cancer screening 07/21/2015  . Estrogen deficiency 07/21/2015  . Calcification of right breast 05/06/2015  . Encounter for Medicare annual wellness exam 07/20/2014  . Unspecified constipation 10/25/2012  . Hepatic cirrhosis ([place]) 10/23/2012  . Hypokalemia 07/13/2012  . Thrombocytopenia ([place]) 11/10/2010  . Routine general medical examination at a health care facility 11/04/2010  . Hyperlipidemia 03/18/2008  . Obesity 03/18/2008  . TRANSAMINASES, SERUM, ELEVATED 03/18/2008  . MENOPAUSE-RELATED VASOMOTOR SYMPTOMS, HOT FLASHES  10/23/2006  . Hypothyroidism 10/11/2006  . GLAUCOMA 10/11/2006  . Essential hypertension 10/11/2006  . GERD 10/11/2006  . History of kidney stones 10/11/2006    Past Medical History:  Diagnosis Date  . Allergy   . Cancer ([place])    basal cell skin CA  . Chronic kidney disease    hx of kidney stones  . Conjunctivitis    chronic  . GERD (gastroesophageal reflux disease)   . Glaucoma   . Hearing loss in left ear   . Hyperlipidemia    no per pt  . Hypertension   . Menopausal syndrome   . NASH (nonalcoholic steatohepatitis)    with cirrhosis and mild esoph varicies  . Primary hypothyroidism     Past Surgical History:  Procedure Laterality Date  . ABDOMINAL HYSTERECTOMY     partial ? prolapse  . CHOLECYSTECTOMY    . ESOPHAGOGASTRODUODENOSCOPY    . TONSILLECTOMY      Social History   Socioeconomic History  . Marital status: Married    Spouse name: Not on file  . Number of children: Not on file  . Years of education: Not on file  . Highest education level: Not on file  Social Needs  . Financial resource strain: Not on file  . Food insecurity - worry: Not on file  . Food insecurity - inability: Not on file  . Transportation needs - medical: Not on file  . Transportation needs - non-medical: Not on file  Occupational History  . Not on file  Tobacco Use  . Smoking status: Former  Smoker  . Smokeless tobacco: Never Used  Substance and Sexual Activity  . Alcohol use: Yes    Alcohol/week: 0.0 oz    Comment: wine- rare  . Drug use: No  . Sexual activity: Not on file  Other Topics Concern  . Not on file  Social History Narrative  . Not on file    Family History  Problem Relation Age of Onset  . Heart disease Mother        CAD  . Hypertension Mother   . Glaucoma Mother   . Heart disease Father        CAD  . Diabetes Father   . Hypertension Father   . Cancer Father        ? CA  . Colon cancer Neg Hx   . Esophageal cancer Neg Hx   . Rectal cancer Neg Hx     . Stomach cancer Neg Hx     Allergies  Allergen Reactions  . Codeine     REACTION: Throat swelling  . Ivp Dye [Iodinated Diagnostic Agents] Swelling    Medication list reviewed and updated in full in [facility].  GEN: No fevers, chills. Nontoxic. Primarily MSK c/o today. MSK: Detailed in the HPI GI: tolerating PO intake without difficulty Neuro: No numbness, parasthesias, or tingling associated. Otherwise the pertinent positives of the ROS are noted above.   Objective:   Blood pressure 100/70, pulse 60, temperature 98.3 F (36.8 C), temperature source Oral, height 5' 5"  (1.651 m), weight 203 lb 4 oz (92.2 kg).  GEN: Well-developed,well-nourished,in no acute distress; alert,appropriate and cooperative throughout examination HEENT: Normocephalic and atraumatic without obvious abnormalities. Ears, externally no deformities PULM: Breathing comfortably in no respiratory distress EXT: No clubbing, cyanosis, or edema PSYCH: Normally interactive. Cooperative during the interview. Pleasant. Friendly and conversant. Not anxious or depressed appearing. Normal, full affect.  Shoulder: R Inspection: No muscle wasting or winging Ecchymosis/edema: neg  AC joint, scapula, clavicle: NT Cervical spine: NT, full ROM Spurling's: neg Abduction: full, 5/5, painful arc of motion Flexion: full, 5/5 IR, full, lift-off: 5/5 ER at neutral: full, 5/5 AC crossover: neg Neer: pos Hawkins: pos Drop Test: neg Empty Can: pos Supraspinatus insertion: mild-mod T Bicipital groove: NT Speed's: neg Yergason's: neg Sulcus sign: neg Scapular dyskinesis: none C5-T1 intact  Neuro: Sensation intact Grip 5/5   Radiology: No results found.  Assessment and Plan:    Impingement syndrome of right shoulder - Plan: Ambulatory referral to Physical Therapy  Right rotator cuff tendonitis - Plan: Ambulatory referral to Physical Therapy  At this point, the patient wants to be about as conservative  as she can be.  No additional medications are wanted and she declines a corticosteroid injection.  I think that that is all reasonable, and we can follow her over time.  For now we will start with therapy and home rehab.  Rotator cuff strengthening and scapular stabilization exercises were reviewed with the patient.  [facility] RTC and scapular stabilization program or MOON shoulder protocol given to the patient. Retraining shoulder mechanics and function was emphasized to the patient with rehab done at least 5-6 days a week.  The patient could benefit from formal PT to assist with scapular stabilization and RTC strengthening.   Follow-up: 2 mo  Orders Placed This Encounter  Procedures  . Ambulatory referral to Physical Therapy    Signed,  Frederico Hamman T. Cannon Arreola, MD   Patient's Medications  New Prescriptions   No medications on file  Previous Medications  BIOTIN 2500 MCG CAPS    Take 1 capsule by mouth daily.     CHOLECALCIFEROL (VITAMIN D3) 2000 UNITS TABS    Take 1 tablet by mouth daily.   LEVOTHYROXINE (SYNTHROID) 175 MCG TABLET    Take 1 tablet (175 mcg total) by mouth daily before breakfast.   LORATADINE (CLARITIN) 10 MG TABLET    Take 10 mg by mouth daily as needed.     METOPROLOL SUCCINATE (TOPROL-XL) 100 MG 24 HR TABLET    TAKE 1 TABLET BY MOUTH EVERY DAY WITH A MEAL   MULTIPLE VITAMIN (MULTIVITAMIN) CAPSULE    Take 1 capsule by mouth daily.     POTASSIUM CHLORIDE SA (KLOR-CON M20) 20 MEQ TABLET    TAKE ONE TABLET BY MOUTH EVERY DAY   TRIAMTERENE-HYDROCHLOROTHIAZIDE (MAXZIDE-25) 37.5-25 MG TABLET    Take 1 tablet by mouth daily.  Modified Medications   No medications on file  Discontinued Medications   No medications on file

## 2017-08-21 DIAGNOSIS — D2310 Other benign neoplasm of skin of unspecified eyelid, including canthus: Secondary | ICD-10-CM | POA: Diagnosis not present

## 2017-08-21 DIAGNOSIS — L821 Other seborrheic keratosis: Secondary | ICD-10-CM | POA: Diagnosis not present

## 2017-08-21 DIAGNOSIS — L820 Inflamed seborrheic keratosis: Secondary | ICD-10-CM | POA: Diagnosis not present

## 2017-08-23 DIAGNOSIS — M7581 Other shoulder lesions, right shoulder: Secondary | ICD-10-CM | POA: Diagnosis not present

## 2017-08-23 DIAGNOSIS — M7541 Impingement syndrome of right shoulder: Secondary | ICD-10-CM | POA: Diagnosis not present

## 2017-08-23 DIAGNOSIS — S46011D Strain of muscle(s) and tendon(s) of the rotator cuff of right shoulder, subsequent encounter: Secondary | ICD-10-CM | POA: Diagnosis not present

## 2017-08-28 DIAGNOSIS — M7581 Other shoulder lesions, right shoulder: Secondary | ICD-10-CM | POA: Diagnosis not present

## 2017-08-28 DIAGNOSIS — S46011D Strain of muscle(s) and tendon(s) of the rotator cuff of right shoulder, subsequent encounter: Secondary | ICD-10-CM | POA: Diagnosis not present

## 2017-08-28 DIAGNOSIS — M7541 Impingement syndrome of right shoulder: Secondary | ICD-10-CM | POA: Diagnosis not present

## 2017-08-31 DIAGNOSIS — S46011D Strain of muscle(s) and tendon(s) of the rotator cuff of right shoulder, subsequent encounter: Secondary | ICD-10-CM | POA: Diagnosis not present

## 2017-08-31 DIAGNOSIS — M7581 Other shoulder lesions, right shoulder: Secondary | ICD-10-CM | POA: Diagnosis not present

## 2017-08-31 DIAGNOSIS — M7541 Impingement syndrome of right shoulder: Secondary | ICD-10-CM | POA: Diagnosis not present

## 2017-09-05 DIAGNOSIS — M7541 Impingement syndrome of right shoulder: Secondary | ICD-10-CM | POA: Diagnosis not present

## 2017-09-05 DIAGNOSIS — M7581 Other shoulder lesions, right shoulder: Secondary | ICD-10-CM | POA: Diagnosis not present

## 2017-09-05 DIAGNOSIS — S46011D Strain of muscle(s) and tendon(s) of the rotator cuff of right shoulder, subsequent encounter: Secondary | ICD-10-CM | POA: Diagnosis not present

## 2017-09-07 DIAGNOSIS — M7581 Other shoulder lesions, right shoulder: Secondary | ICD-10-CM | POA: Diagnosis not present

## 2017-09-07 DIAGNOSIS — M7541 Impingement syndrome of right shoulder: Secondary | ICD-10-CM | POA: Diagnosis not present

## 2017-09-07 DIAGNOSIS — S46011D Strain of muscle(s) and tendon(s) of the rotator cuff of right shoulder, subsequent encounter: Secondary | ICD-10-CM | POA: Diagnosis not present

## 2018-01-09 DIAGNOSIS — H40003 Preglaucoma, unspecified, bilateral: Secondary | ICD-10-CM | POA: Diagnosis not present

## 2018-02-28 DIAGNOSIS — Z1231 Encounter for screening mammogram for malignant neoplasm of breast: Secondary | ICD-10-CM | POA: Diagnosis not present

## 2018-03-01 ENCOUNTER — Encounter: Payer: Self-pay | Admitting: Family Medicine

## 2018-03-02 DIAGNOSIS — N6322 Unspecified lump in the left breast, upper inner quadrant: Secondary | ICD-10-CM | POA: Diagnosis not present

## 2018-03-05 ENCOUNTER — Encounter: Payer: Self-pay | Admitting: Family Medicine

## 2018-03-07 ENCOUNTER — Other Ambulatory Visit: Payer: Self-pay | Admitting: Radiology

## 2018-03-07 DIAGNOSIS — N63 Unspecified lump in unspecified breast: Secondary | ICD-10-CM | POA: Diagnosis not present

## 2018-03-07 DIAGNOSIS — N6325 Unspecified lump in the left breast, overlapping quadrants: Secondary | ICD-10-CM | POA: Diagnosis not present

## 2018-03-16 DIAGNOSIS — D0439 Carcinoma in situ of skin of other parts of face: Secondary | ICD-10-CM | POA: Diagnosis not present

## 2018-03-16 DIAGNOSIS — L814 Other melanin hyperpigmentation: Secondary | ICD-10-CM | POA: Diagnosis not present

## 2018-03-16 DIAGNOSIS — L821 Other seborrheic keratosis: Secondary | ICD-10-CM | POA: Diagnosis not present

## 2018-03-16 DIAGNOSIS — D229 Melanocytic nevi, unspecified: Secondary | ICD-10-CM | POA: Diagnosis not present

## 2018-03-16 DIAGNOSIS — D1801 Hemangioma of skin and subcutaneous tissue: Secondary | ICD-10-CM | POA: Diagnosis not present

## 2018-03-16 DIAGNOSIS — L570 Actinic keratosis: Secondary | ICD-10-CM | POA: Diagnosis not present

## 2018-04-05 DIAGNOSIS — Z23 Encounter for immunization: Secondary | ICD-10-CM | POA: Diagnosis not present

## 2018-04-16 ENCOUNTER — Encounter: Payer: Self-pay | Admitting: *Deleted

## 2018-05-07 DIAGNOSIS — D0439 Carcinoma in situ of skin of other parts of face: Secondary | ICD-10-CM | POA: Diagnosis not present

## 2018-06-12 DIAGNOSIS — N6325 Unspecified lump in the left breast, overlapping quadrants: Secondary | ICD-10-CM | POA: Diagnosis not present

## 2018-06-12 DIAGNOSIS — N6322 Unspecified lump in the left breast, upper inner quadrant: Secondary | ICD-10-CM | POA: Diagnosis not present

## 2018-06-17 ENCOUNTER — Telehealth: Payer: Self-pay | Admitting: Family Medicine

## 2018-06-17 DIAGNOSIS — N6320 Unspecified lump in the left breast, unspecified quadrant: Secondary | ICD-10-CM | POA: Insufficient documentation

## 2018-06-17 NOTE — Telephone Encounter (Signed)
I did an order for US guided breast bx It looks as if medicare may not pay for that?  She goes to [facility] forward to [facility]

## 2018-06-18 ENCOUNTER — Telehealth: Payer: Self-pay | Admitting: Family Medicine

## 2018-06-18 NOTE — Telephone Encounter (Signed)
[facility] 1/15

## 2018-06-18 NOTE — Telephone Encounter (Signed)
Left message asking [facility] to call office  Please transfer to [facility] if im not hear give to [facility]

## 2018-06-20 ENCOUNTER — Other Ambulatory Visit: Payer: Self-pay | Admitting: Radiology

## 2018-06-20 DIAGNOSIS — C50212 Malignant neoplasm of upper-inner quadrant of left female breast: Secondary | ICD-10-CM | POA: Diagnosis not present

## 2018-06-20 DIAGNOSIS — N6322 Unspecified lump in the left breast, upper inner quadrant: Secondary | ICD-10-CM | POA: Diagnosis not present

## 2018-06-25 ENCOUNTER — Telehealth: Payer: Self-pay | Admitting: Oncology

## 2018-06-25 NOTE — Telephone Encounter (Signed)
Spoke with patient to confirm morning [facility] appointment for 1/29, [facility] patient reminder letter sent only

## 2018-06-27 ENCOUNTER — Encounter: Payer: Self-pay | Admitting: *Deleted

## 2018-06-27 DIAGNOSIS — C50212 Malignant neoplasm of upper-inner quadrant of left female breast: Secondary | ICD-10-CM

## 2018-06-27 DIAGNOSIS — Z170 Estrogen receptor positive status [ER+]: Secondary | ICD-10-CM

## 2018-07-02 DIAGNOSIS — Z85828 Personal history of other malignant neoplasm of skin: Secondary | ICD-10-CM | POA: Diagnosis not present

## 2018-07-02 DIAGNOSIS — L810 Postinflammatory hyperpigmentation: Secondary | ICD-10-CM | POA: Diagnosis not present

## 2018-07-03 NOTE — Progress Notes (Signed)
[place]  [phone] [phone]     ID: Selena Johnson DOB: 1950-06-27  MR#: [id number]  PJA#:[id number]  Patient Care Team: Abner Greenspan, MD as PCP - General Vin-Parikh, Deirdre Peer, MD as Referring Physician (Ophthalmology) Glennie Isle, PA-C as Physician Assistant (Physician Assistant) Magrinat, Virgie Dad, MD as Consulting Physician (Oncology) Rolm Bookbinder, MD as Consulting Physician (General Surgery) Kyung Rudd, MD as Consulting Physician (Radiation Oncology) Druscilla Brownie, MD as Referring Physician (Dermatology) OTHER MD:    CHIEF COMPLAINT: Estrogen receptor positive breast cancer   CURRENT TREATMENT: Awaiting definitive surgery   HISTORY OF CURRENT ILLNESS: Selena Johnson underwent routine screening mammography at [facility] on 02/28/2018 showing an irregularity in the left breast. On 03/02/2018, she followed up with a unilateral left diagnostic mammogram and unilateral left ultrasound where a biopsy was recommended. A biopsy was performed on 03/07/2018. Pathology from the procedure ([id number]) showed benign breast tissue with no specific histopathologic changes, negative for carcinoma. A three month follow up was recommended.  At her follow up, she underwent bilateral diagnostic mammography with tomography and left breast ultrasonography at [facility] on 06/12/2018 showing: Breast Density Category C. There os a 0.7 cm oval mass in the left breast at 9 o'clock posterior depth 9 cm from the nipple. There is a questioned mild architectural distortion associated with the mass. Biopsy clip is noted 1 cm medial and 1.7 cm inferior to the mass. No other significant masses or calcifications are seen in the breast. On ultrasound, there is a 0.9 x 0.7 cm oval mass with an angular margin in the left breast at 9:30 o'clock 8 cm from the nipple. Color flow imaging demonstrates that there is vascularity present. Elastography imaging assessment is soft. No  definite biopsy clip is associated with this mass. This mass corresponds to the mammographic mass. No significant abnormalities were seen sonographically in the left axilla.   Accordingly on 06/20/2018 she proceeded to biopsy of the left breast area in question. The pathology from this procedure showed ([id number]): invasive ductal carcinoma, grade I. Prognostic indicators significant for: estrogen receptor, 100% positive and progesterone receptor, 20% positive, both with strong staining intensity. Proliferation marker Ki67 at 3%. HER2 negative (1+) by immunohistochemistry.  The patient's subsequent history is as detailed below.   INTERVAL HISTORY: Selena Johnson was evaluated in the multidisciplinary breast cancer clinic on 07/04/2018.  Her case was also presented at the multidisciplinary breast cancer conference on the same day. At that time a preliminary plan was proposed: Breast conserving surgery, no Oncotype, adjuvant radiation, antiestrogens   REVIEW OF SYSTEMS: For exercise, Selena Johnson has not been active recently since she has been taking care of some sick family members. There were no specific symptoms leading to the original mammogram, which was routinely scheduled. The patient denies unusual headaches, visual changes, nausea, vomiting, stiff neck, dizziness, or gait imbalance. There has been no cough, phlegm production, or pleurisy, no chest pain or pressure, and no change in bowel or bladder habits. The patient denies fever, rash, bleeding, unexplained fatigue or unexplained weight loss. A detailed review of systems was otherwise entirely negative.   PAST MEDICAL HISTORY: Past Medical History:  Diagnosis Date  . Allergy   . Cancer ([place])    basal cell skin CA  . Chronic kidney disease    hx of kidney stones  . Conjunctivitis    chronic  . GERD (gastroesophageal reflux disease)   . Glaucoma   . Hearing loss in left ear   . Hyperlipidemia  no per pt  . Hypertension   . Menopausal  syndrome   . NASH (nonalcoholic steatohepatitis)    with cirrhosis and mild esoph varicies  . Primary hypothyroidism      PAST SURGICAL HISTORY: Past Surgical History:  Procedure Laterality Date  . ABDOMINAL HYSTERECTOMY     partial ? prolapse  . CHOLECYSTECTOMY    . ESOPHAGOGASTRODUODENOSCOPY    . TONSILLECTOMY       FAMILY HISTORY: Family History  Problem Relation Age of Onset  . Heart disease Mother        CAD  . Hypertension Mother   . Glaucoma Mother   . Heart disease Father        CAD  . Diabetes Father   . Hypertension Father   . Cancer Father        ? CA - squamous cell carcinoma  . Colon cancer Neg Hx   . Esophageal cancer Neg Hx   . Rectal cancer Neg Hx   . Stomach cancer Neg Hx    Selena Johnson father died from squamous cell cancer of the lung at age 59. Patients' mother died from stroke and sepsis at age 66. The patient has 1 brother and 1 sister. Her brother has advanced renal cell CA and is being treated at [facility]. Patient denies anyone in her family having breast, ovarian, prostate, or pancreatic cancer.    GYNECOLOGIC HISTORY:  No LMP recorded. Patient has had a hysterectomy. 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 She has no birth children and one deceased step-child. Baylynn has no grandchildren. She attends a [employer].   ADVANCED DIRECTIVES: Her husband, Selena Johnson.   HEALTH MAINTENANCE: Social History   Tobacco Use  . Smoking status: Former Smoker    Last attempt to quit: 06/06/1982    Years since quitting: 36.1  . Smokeless tobacco: Never Used  Substance Use Topics  . Alcohol use: Yes    Alcohol/week: 0.0 standard drinks    Comment: wine- rare  . Drug use: No    Colonoscopy: yes, Nandigam  PAP:    Bone density: 07/30/2015 at [facility], score -0.3   Allergies  Allergen Reactions  . Codeine     REACTION: Throat swelling  . Ivp Dye [Iodinated Diagnostic Agents] Swelling    Current Outpatient Medications  Medication Sig Dispense Refill  . Biotin 2500 MCG CAPS Take 1 capsule by mouth daily. 5,000 mcg    . Cholecalciferol (VITAMIN D3) 2000 units TABS Take 1 tablet by mouth daily.    Marland Kitchen levothyroxine (SYNTHROID) 175 MCG tablet Take 1 tablet (175 mcg total) by mouth daily before breakfast. 90 tablet 3  . loratadine (CLARITIN) 10 MG tablet Take 10 mg by mouth daily as needed.      . metoprolol succinate (TOPROL-XL) 100 MG 24 hr tablet TAKE 1 TABLET BY MOUTH EVERY DAY WITH A MEAL 90 tablet 3  . Multiple Vitamin (MULTIVITAMIN) capsule Take 1 capsule by mouth daily.      . potassium chloride SA (KLOR-CON M20) 20 MEQ tablet TAKE ONE TABLET BY MOUTH EVERY DAY 90 tablet 3  . triamterene-hydrochlorothiazide (MAXZIDE-25) 37.5-25 MG tablet Take 1 tablet by mouth daily. 90 tablet 3   Current Facility-Administered Medications  Medication Dose Route Frequency Provider Last Rate Last Dose  .  0.9 %  sodium chloride infusion  500 mL Intravenous Continuous Nandigam, Venia Minks, MD         OBJECTIVE: Middle-aged white woman in no acute distress  Vitals:   07/04/18 0843  BP: (!) 150/70  Pulse: 63  Resp: 18  Temp: (!) 97.5 F (36.4 C)  SpO2: 100%     Body mass index is 36.06 kg/m.   Wt Readings from Last 3 Encounters:  07/04/18 213 lb 6.4 oz (96.8 kg)  08/16/17 203 lb 4 oz (92.2 kg)  07/28/17 204 lb 8 oz (92.8 kg)      ECOG FS:0 - Asymptomatic  Ocular: Sclerae unicteric, pupils round and equal Lymphatic: No cervical or supraclavicular adenopathy Lungs no rales or rhonchi Heart regular rate and rhythm Abd soft, nontender, positive bowel sounds MSK no focal spinal tenderness, no joint edema Neuro: non-focal, well-oriented, appropriate affect Breasts: The right breast is unremarkable.  The  left breast is status post recent biopsy.  There is mild ecchymosis.  There are no skin or nipple changes of concern.  Both axillae are benign.   LAB RESULTS:  CMP     Component Value Date/Time   NA 141 07/04/2018 0823   K 3.6 07/04/2018 0823   CL 104 07/04/2018 0823   CO2 28 07/04/2018 0823   GLUCOSE 93 07/04/2018 0823   BUN 9 07/04/2018 0823   CREATININE 0.73 07/04/2018 0823   CALCIUM 9.6 07/04/2018 0823   PROT 6.8 07/04/2018 0823   ALBUMIN 3.8 07/04/2018 0823   AST 53 (H) 07/04/2018 0823   ALT 36 07/04/2018 0823   ALKPHOS 111 07/04/2018 0823   BILITOT 2.2 (H) 07/04/2018 0823   GFRNONAA >60 07/04/2018 0823   GFRAA >60 07/04/2018 0823    No results found for: TOTALPROTELP, ALBUMINELP, A1GS, A2GS, BETS, BETA2SER, GAMS, MSPIKE, SPEI  No results found for: KPAFRELGTCHN, LAMBDASER, [facility]  Lab Results  Component Value Date   WBC 4.5 07/04/2018   NEUTROABS 2.9 07/04/2018   HGB 14.8 07/04/2018   HCT 42.7 07/04/2018   MCV 100.0 07/04/2018   PLT 67 (L) 07/04/2018    _0 @  No results found for: LABCA2  No components found for: NFAOZH086  No results for input(s): INR in the last 168 hours.  No results found for: LABCA2  No results found for: VHQ469  No results found for: GEX528  No results found for: UXL244  No results found for: CA2729  No components found for: HGQUANT  No results found for: CEA1 / No results found for: CEA1   No results found for: AFPTUMOR  No results found for: CHROMOGRNA  No results found for: PSA1  Appointment on 07/04/2018  Component Date Value Ref Range Status  . Sodium 07/04/2018 141  135 - 145 mmol/L Final  . Potassium 07/04/2018 3.6  3.5 - 5.1 mmol/L Final  . Chloride 07/04/2018 104  98 - 111 mmol/L Final  . CO2 07/04/2018 28  22 - 32 mmol/L Final  . Glucose, Bld 07/04/2018 93  70 - 99 mg/dL Final  . BUN 07/04/2018 9  8 - 23 mg/dL Final  . Creatinine 07/04/2018 0.73  0.44 - 1.00 mg/dL Final  . Calcium  07/04/2018 9.6  8.9 - 10.3 mg/dL Final  . Total Protein 07/04/2018 6.8  6.5 - 8.1 g/dL Final  . Albumin 07/04/2018 3.8  3.5 - 5.0 g/dL Final  . AST 07/04/2018 53* 15 - 41 U/L Final  . ALT 07/04/2018 36  0 - 44 U/L Final  .  Alkaline Phosphatase 07/04/2018 111  38 - 126 U/L Final  . Total Bilirubin 07/04/2018 2.2* 0.3 - 1.2 mg/dL Final  . GFR, Est Non Af Am 07/04/2018 >60  >60 mL/min Final  . GFR, Est AFR Am 07/04/2018 >60  >60 mL/min Final  . Anion gap 07/04/2018 9  5 - 15 Final   Performed at [facility], [place] [address], [place], [place] [address]  . WBC Count 07/04/2018 4.5  4.0 - 10.5 K/uL Final  . RBC 07/04/2018 4.27  3.87 - 5.11 MIL/uL Final  . Hemoglobin 07/04/2018 14.8  12.0 - 15.0 g/dL Final  . HCT 07/04/2018 42.7  36.0 - 46.0 % Final  . MCV 07/04/2018 100.0  80.0 - 100.0 fL Final  . MCH 07/04/2018 34.7* 26.0 - 34.0 pg Final  . MCHC 07/04/2018 34.7  30.0 - 36.0 g/dL Final  . RDW 07/04/2018 13.0  11.5 - 15.5 % Final  . Platelet Count 07/04/2018 67* 150 - 400 K/uL Final  . nRBC 07/04/2018 0.0  0.0 - 0.2 % Final  . Neutrophils Relative % 07/04/2018 63  % Final  . Neutro Abs 07/04/2018 2.9  1.7 - 7.7 K/uL Final  . Lymphocytes Relative 07/04/2018 22  % Final  . Lymphs Abs 07/04/2018 1.0  0.7 - 4.0 K/uL Final  . Monocytes Relative 07/04/2018 8  % Final  . Monocytes Absolute 07/04/2018 0.4  0.1 - 1.0 K/uL Final  . Eosinophils Relative 07/04/2018 6  % Final  . Eosinophils Absolute 07/04/2018 0.3  0.0 - 0.5 K/uL Final  . Basophils Relative 07/04/2018 1  % Final  . Basophils Absolute 07/04/2018 0.0  0.0 - 0.1 K/uL Final  . Immature Granulocytes 07/04/2018 0  % Final  . Abs Immature Granulocytes 07/04/2018 0.01  0.00 - 0.07 K/uL Final   Performed at [facility], [place] [address] [place], [place] [address]    (this displays the last labs from the last 3 days)  No results found for: TOTALPROTELP, ALBUMINELP, A1GS, A2GS, BETS, BETA2SER,  GAMS, MSPIKE, SPEI (this displays SPEP labs)  No results found for: KPAFRELGTCHN, LAMBDASER, KAPLAMBRATIO (kappa/lambda light chains)  No results found for: HGBA, HGBA2QUANT, HGBFQUANT, HGBSQUAN (Hemoglobinopathy evaluation)   No results found for: LDH  No results found for: IRON, TIBC, IRONPCTSAT (Iron and TIBC)  Lab Results  Component Value Date   FERRITIN 171.6 10/25/2012    Urinalysis    Component Value Date/Time   BILIRUBINUR neg. 05/21/2013 1623   PROTEINUR neg. 05/21/2013 1623   UROBILINOGEN 0.2 05/21/2013 1623   NITRITE neg. 05/21/2013 1623   LEUKOCYTESUR small (1+) 05/21/2013 1623     STUDIES:  Outside radiology studies discussed with the patient  ELIGIBLE FOR AVAILABLE RESEARCH PROTOCOL: no   ASSESSMENT: 68 y.o. [place], [place] woman s/p Left breast upper inner quadrant biopsy 06/20/2018 for a clinical T1b No, stage IA invasive ducatal carcinoma, grade 1, estrogen and progesterone receptor positive, HER-2 not amplified, with an Mib-1 of 5%  (1) definitive surgery pending  (2) antiestrogens to follow at the completion of local treatment  (a) bone density scan at [facility] 07/30/2015 shows a T score of -0.3   PLAN: I spent approximately 60 minutes face to face with Horris Latino with more than 50% of that time spent in counseling and coordination of care. Specifically we reviewed the biology of the patient's diagnosis and the specifics of her situation. We first reviewed the fact that cancer is not one disease but more than 100  different diseases and that it is important to keep them separate-- otherwise when friends and relatives discuss their own cancer experiences with Darrah confusion can result. Similarly we explained that if breast cancer spreads to the bone or liver, the patient would not have bone cancer or liver cancer, but breast cancer in the bone and breast cancer in the liver: one cancer in three places-- not 3 different cancers which otherwise would have to be  treated in 3 different ways.  We discussed the difference between local and systemic therapy. In terms of loco-regional treatment, lumpectomy plus radiation is equivalent to mastectomy as far as survival is concerned. For this reason, and because the cosmetic results are generally superior, we recommend breast conserving surgery.  We then discussed the rationale for systemic therapy. There is some risk that this cancer may have already spread to other parts of her body. Patients frequently ask at this point about bone scans, CAT scans and PET scans to find out if they have occult breast cancer somewhere else. The problem is that in early stage disease we are much more likely to find false positives then true cancers and this would expose the patient to unnecessary procedures as well as unnecessary radiation. Scans cannot answer the question the patient really would like to know, which is whether she has microscopic disease elsewhere in her body. For those reasons we do not recommend them.  Of course we would proceed to aggressive evaluation of any symptoms that might suggest metastatic disease, but that is not the case here.  Next we went over the options for systemic therapy which are anti-estrogens, anti-HER-2 immunotherapy, and chemotherapy. Tarissa does not meet criteria for anti-HER-2 immunotherapy. She is a good candidate for anti-estrogens.  The question of chemotherapy is more complicated. Chemotherapy is most effective in rapidly growing, aggressive tumors. It is much less effective in low-grade, slow growing cancers, like Errica 's. For that reason and taking into account as well of the size of this tumor we foregoing Oncotype testing and will rely on antiestrogens alone for systemic therapy  Suzetta has a good understanding of the overall plan. She agrees with it. She knows the goal of treatment in her case is cure. She will call with any problems that may develop before her next visit  here.   Magrinat, Virgie Dad, MD  07/04/18 11:12 AM Medical Oncology and Hematology [facility] [address] [place], [place] [address] Tel. [phone]    Fax. [phone]    I, Jacqualyn Posey am acting as a Education administrator for Chauncey Cruel, MD.   I, Lurline Del MD, have reviewed the above documentation for accuracy and completeness, and I agree with the above.

## 2018-07-04 ENCOUNTER — Inpatient Hospital Stay: Payer: Medicare Other

## 2018-07-04 ENCOUNTER — Other Ambulatory Visit: Payer: Self-pay

## 2018-07-04 ENCOUNTER — Encounter: Payer: Self-pay | Admitting: Oncology

## 2018-07-04 ENCOUNTER — Other Ambulatory Visit: Payer: Self-pay | Admitting: Family Medicine

## 2018-07-04 ENCOUNTER — Encounter: Payer: Self-pay | Admitting: Physical Therapy

## 2018-07-04 ENCOUNTER — Other Ambulatory Visit: Payer: Self-pay | Admitting: General Surgery

## 2018-07-04 ENCOUNTER — Inpatient Hospital Stay: Payer: Medicare Other | Attending: Oncology | Admitting: Oncology

## 2018-07-04 ENCOUNTER — Ambulatory Visit: Payer: Medicare Other | Attending: General Surgery | Admitting: Physical Therapy

## 2018-07-04 ENCOUNTER — Ambulatory Visit
Admission: RE | Admit: 2018-07-04 | Discharge: 2018-07-04 | Disposition: A | Discharge status: Home / Self Care | Payer: Medicare Other | Source: Ambulatory Visit | Attending: Radiation Oncology | Admitting: Radiation Oncology

## 2018-07-04 VITALS — BP 150/70 | HR 63 | Temp 97.5°F | Resp 18 | Ht 64.5 in | Wt 213.4 lb

## 2018-07-04 DIAGNOSIS — I10 Essential (primary) hypertension: Secondary | ICD-10-CM | POA: Diagnosis not present

## 2018-07-04 DIAGNOSIS — C50212 Malignant neoplasm of upper-inner quadrant of left female breast: Secondary | ICD-10-CM | POA: Insufficient documentation

## 2018-07-04 DIAGNOSIS — R293 Abnormal posture: Secondary | ICD-10-CM | POA: Insufficient documentation

## 2018-07-04 DIAGNOSIS — Z87891 Personal history of nicotine dependence: Secondary | ICD-10-CM | POA: Diagnosis not present

## 2018-07-04 DIAGNOSIS — Z170 Estrogen receptor positive status [ER+]: Secondary | ICD-10-CM | POA: Insufficient documentation

## 2018-07-04 LAB — CMP (CANCER CENTER ONLY)
ALBUMIN: 3.8 g/dL (ref 3.5–5.0)
ALK PHOS: 111 U/L (ref 38–126)
ALT: 36 U/L (ref 0–44)
ANION GAP: 9 (ref 5–15)
AST: 53 U/L — ABNORMAL HIGH (ref 15–41)
BUN: 9 mg/dL (ref 8–23)
CO2: 28 mmol/L (ref 22–32)
Calcium: 9.6 mg/dL (ref 8.9–10.3)
Chloride: 104 mmol/L (ref 98–111)
Creatinine: 0.73 mg/dL (ref 0.44–1.00)
GFR, Est AFR Am: >60 mL/min (ref >60)
GFR, Estimated: >60 mL/min (ref >60)
GLUCOSE: 93 mg/dL (ref 70–99)
Potassium: 3.6 mmol/L (ref 3.5–5.1)
Sodium: 141 mmol/L (ref 135–145)
Total Bilirubin: 2.2 mg/dL — ABNORMAL HIGH (ref 0.3–1.2)
Total Protein: 6.8 g/dL (ref 6.5–8.1)

## 2018-07-04 LAB — CBC WITH DIFFERENTIAL (CANCER CENTER ONLY)
Abs Immature Granulocytes: 0.01 10*3/uL (ref 0.00–0.07)
BASOS ABS: 0 10*3/uL (ref 0.0–0.1)
Basophils Relative: 1 %
EOS PCT: 6 %
Eosinophils Absolute: 0.3 10*3/uL (ref 0.0–0.5)
HCT: 42.7 % (ref 36.0–46.0)
Hemoglobin: 14.8 g/dL (ref 12.0–15.0)
Immature Granulocytes: 0 %
Lymphocytes Relative: 22 %
Lymphs Abs: 1 10*3/uL (ref 0.7–4.0)
MCH: 34.7 pg — ABNORMAL HIGH (ref 26.0–34.0)
MCHC: 34.7 g/dL (ref 30.0–36.0)
MCV: 100 fL (ref 80.0–100.0)
Monocytes Absolute: 0.4 10*3/uL (ref 0.1–1.0)
Monocytes Relative: 8 %
Neutro Abs: 2.9 10*3/uL (ref 1.7–7.7)
Neutrophils Relative %: 63 %
Platelet Count: 67 10*3/uL — ABNORMAL LOW (ref 150–400)
RBC: 4.27 MIL/uL (ref 3.87–5.11)
RDW: 13 % (ref 11.5–15.5)
WBC Count: 4.5 10*3/uL (ref 4.0–10.5)
nRBC: 0 % (ref 0.0–0.2)

## 2018-07-04 NOTE — Progress Notes (Signed)
Nutrition Assessment  Reason for Assessment:  Pt seen in Breast Clinic  ASSESSMENT:   68 year old female with new diagnosis of breast cancer.  Past medical history, kidney stones, gallbladder, IBS.  Patient reports normal appetite  Medications:  reviewed  Labs: reviewed  Anthropometrics:   Height: 64.5 inches Weight: 213 lb 6.4 oz BMI: 36   NUTRITION DIAGNOSIS: Food and nutrition related knowledge deficit related to new diagnosis of breast cancer as evidenced by no prior need for nutrition related information.  INTERVENTION:   Discussed and provided packet of information regarding nutritional tips for breast cancer patients.  Questions answered.  Teachback method used.  Contact information provided and patient knows to contact me with questions/concerns.    MONITORING, EVALUATION, and GOAL: Pt will consume a healthy plant based diet to maintain lean body mass throughout treatment.   Makayela Secrest B. Zenia Resides, [place], [place] Registered Dietitian [phone] (pager)

## 2018-07-04 NOTE — Progress Notes (Signed)
Radiation Oncology         [phone] ________________________________  Name: Selena Johnson        MRN: [id number]  Date of Service: 07/04/2018 DOB: March 24, 1951  CC:Tower, [name], MD  Tower, [name], MD     REFERRING PHYSICIAN: Tower, [name], MD   DIAGNOSIS: The encounter diagnosis was Malignant neoplasm of upper-inner quadrant of left breast in female, estrogen receptor positive ([place]).   HISTORY OF PRESENT ILLNESS: Selena Johnson is a 68 y.o. female seen in the multidisciplinary breast clinic for a new diagnosis of left breast cancer. The patient was noted to have a screening detected mass in the left breast. It was measured as 9 mm on diagnostic ultrasound and her axilla was negative for adenopathy. She underwent a biopsy on 06/20/2018 revealed a grade 1, ER/PR positive invasive ductal carcinoma. Her tumor was HER2 negative, and her Ki 67 was 20%. She comes today to discuss treatment recommendations for her cancer care.    PREVIOUS RADIATION THERAPY: No   PAST MEDICAL HISTORY:  Past Medical History:  Diagnosis Date  . Allergy   . Cancer ([place])    basal cell skin CA  . Chronic kidney disease    hx of kidney stones  . Conjunctivitis    chronic  . GERD (gastroesophageal reflux disease)   . Glaucoma   . Hearing loss in left ear   . Hyperlipidemia    no per pt  . Hypertension   . Menopausal syndrome   . NASH (nonalcoholic steatohepatitis)    with cirrhosis and mild esoph varicies  . Primary hypothyroidism        PAST SURGICAL HISTORY: Past Surgical History:  Procedure Laterality Date  . ABDOMINAL HYSTERECTOMY     partial ? prolapse  . CHOLECYSTECTOMY    . ESOPHAGOGASTRODUODENOSCOPY    . TONSILLECTOMY       FAMILY HISTORY:  Family History  Problem Relation Age of Onset  . Heart disease Mother        CAD  . Hypertension Mother   . Glaucoma Mother   . Heart disease Father        CAD  . Diabetes Father   . Hypertension Father   . Cancer Father        ? CA  - squamous cell carcinoma  . Colon cancer Neg Hx   . Esophageal cancer Neg Hx   . Rectal cancer Neg Hx   . Stomach cancer Neg Hx      SOCIAL HISTORY:  reports that she quit smoking about 36 years ago. She has never used smokeless tobacco. She reports current alcohol use. She reports that she does not use drugs. The patient is married and lives in [place]. She is retired.    ALLERGIES: Codeine and Ivp dye [iodinated diagnostic agents]   MEDICATIONS:  Current Outpatient Medications  Medication Sig Dispense Refill  . Biotin 2500 MCG CAPS Take 1 capsule by mouth daily. 5,000 mcg    . Cholecalciferol (VITAMIN D3) 2000 units TABS Take 1 tablet by mouth daily.    Marland Kitchen levothyroxine (SYNTHROID) 175 MCG tablet Take 1 tablet (175 mcg total) by mouth daily before breakfast. 90 tablet 3  . loratadine (CLARITIN) 10 MG tablet Take 10 mg by mouth daily as needed.      . metoprolol succinate (TOPROL-XL) 100 MG 24 hr tablet TAKE 1 TABLET BY MOUTH EVERY DAY WITH A MEAL 90 tablet 3  . Multiple Vitamin (MULTIVITAMIN) capsule Take 1  capsule by mouth daily.      . potassium chloride SA (KLOR-CON M20) 20 MEQ tablet TAKE ONE TABLET BY MOUTH EVERY DAY 90 tablet 3  . triamterene-hydrochlorothiazide (MAXZIDE-25) 37.5-25 MG tablet Take 1 tablet by mouth daily. 90 tablet 3   Current Facility-Administered Medications  Medication Dose Route Frequency Provider Last Rate Last Dose  . 0.9 %  sodium chloride infusion  500 mL Intravenous Continuous Nandigam, Kavitha V, MD         REVIEW OF SYSTEMS: On review of systems, the patient reports that she is doing well overall. She denies any chest pain, shortness of breath, cough, fevers, chills, night sweats, unintended weight changes. She denies any bowel or bladder disturbances, and denies abdominal pain, nausea or vomiting. She denies any new musculoskeletal or joint aches or pains. A complete review of systems is obtained and is otherwise negative.     PHYSICAL  EXAM:  Wt Readings from Last 3 Encounters:  07/04/18 213 lb 6.4 oz (96.8 kg)  08/16/17 203 lb 4 oz (92.2 kg)  07/28/17 204 lb 8 oz (92.8 kg)   Temp Readings from Last 3 Encounters:  07/04/18 (!) 97.5 F (36.4 C) (Oral)  08/16/17 98.3 F (36.8 C) (Oral)  07/28/17 98.4 F (36.9 C) (Oral)   BP Readings from Last 3 Encounters:  07/04/18 (!) 150/70  08/16/17 100/70  07/28/17 128/72   Pulse Readings from Last 3 Encounters:  07/04/18 63  08/16/17 60  07/28/17 66     In general this is a well appearing caucasian female in no acute distress. She is alert and oriented x4 and appropriate throughout the examination. HEENT reveals that the patient is normocephalic, atraumatic. EOMs are intact. Skin is intact without any evidence of gross lesions. Cardiopulmonary assessment is negative for acute distress and she exhibits normal effort. Breast exam is deferred.     ECOG = 0  0 - Asymptomatic (Fully active, able to carry on all predisease activities without restriction)  1 - Symptomatic but completely ambulatory (Restricted in physically strenuous activity but ambulatory and able to carry out work of a light or sedentary nature. For example, light housework, office work)  2 - Symptomatic, <50% in bed during the day (Ambulatory and capable of all self care but unable to carry out any work activities. Up and about more than 50% of waking hours)  3 - Symptomatic, >50% in bed, but not bedbound (Capable of only limited self-care, confined to bed or chair 50% or more of waking hours)  4 - Bedbound (Completely disabled. Cannot carry on any self-care. Totally confined to bed or chair)  5 - Death   Oken MM, Creech RH, Tormey DC, et al. (1982). "Toxicity and response criteria of the Eastern Cooperative Oncology Group". Am. J. Clin. Oncol. 5 (6): 649-55    LABORATORY DATA:  Lab Results  Component Value Date   WBC 4.5 07/04/2018   HGB 14.8 07/04/2018   HCT 42.7 07/04/2018   MCV 100.0  07/04/2018   PLT 67 (L) 07/04/2018   Lab Results  Component Value Date   NA 141 07/04/2018   K 3.6 07/04/2018   CL 104 07/04/2018   CO2 28 07/04/2018   Lab Results  Component Value Date   ALT 36 07/04/2018   AST 53 (H) 07/04/2018   ALKPHOS 111 07/04/2018   BILITOT 2.2 (H) 07/04/2018      RADIOGRAPHY: No results found.     IMPRESSION/PLAN: 1. Stage IA, [id number], Grade 1, ER/PR positive,   invasive ductal carcinoma of the left breast. Dr. Lisbeth Renshaw discusses the pathology findings and reviews the nature of invasive breast disease. The consensus from the breast conference includes breast conservation with lumpectomy with  sentinel node biopsy. Depending on the size of the final tumor measurements rendered by pathology, the tumor may be tested for Oncotype Dx score to determine a role for systemic therapy. Although this is not anticipate today, we will see what the final report from pathology indicates as far as size. Provided that chemotherapy is not indicated, the patient's course would then be followed by external radiotherapy to the breast followed by antiestrogen therapy. We discussed the risks, benefits, short, and long term effects of radiotherapy, and the patient is interested in proceeding. Dr. Lisbeth Renshaw discusses the delivery and logistics of radiotherapy and anticipates a course of 4 or 6 1/2 weeks of radiotherapy. Based on what we know today, we would lean toward a 4 week course.  We will see her back about 2 weeks after surgery to discuss the simulation process and anticipate we starting radiotherapy about 4-6 weeks after surgery.   In a visit lasting 30  minutes, greater than 50% of the time was spent face to face discussing her case, and coordinating the patient's care.  The above documentation reflects my direct findings during this shared patient visit. Please see the separate note by Dr. Lisbeth Renshaw on this date for the remainder of the patient's plan of care.    Carola Rhine,  PAC

## 2018-07-04 NOTE — Patient Instructions (Signed)

## 2018-07-04 NOTE — Therapy (Signed)
[place], [place], [address] Phone: [phone]   Fax:  [phone]  Physical Therapy Evaluation  Patient Details  Name: Selena Johnson MRN: [id number] Date of Birth: July 02, 1950 Referring Provider (PT): Dr. Rolm Bookbinder   Encounter Date: 07/04/2018  PT End of Session - 07/04/18 1048    Visit Number  1    Number of Visits  2    Date for PT Re-Evaluation  08/29/18    PT Start Time  2683    PT Stop Time  0950   Also saw pt from 1005-1021 for a total of 24 minutes   PT Time Calculation (min)  8 min    Activity Tolerance  Patient tolerated treatment well    Behavior During Therapy  [facility] for tasks assessed/performed       Past Medical History:  Diagnosis Date  . Allergy   . Cancer ([place])    basal cell skin CA  . Chronic kidney disease    hx of kidney stones  . Conjunctivitis    chronic  . GERD (gastroesophageal reflux disease)   . Glaucoma   . Hearing loss in left ear   . Hyperlipidemia    no per pt  . Hypertension   . Menopausal syndrome   . NASH (nonalcoholic steatohepatitis)    with cirrhosis and mild esoph varicies  . Primary hypothyroidism     Past Surgical History:  Procedure Laterality Date  . ABDOMINAL HYSTERECTOMY     partial ? prolapse  . CHOLECYSTECTOMY    . ESOPHAGOGASTRODUODENOSCOPY    . TONSILLECTOMY      There were no vitals filed for this visit.   Subjective Assessment - 07/04/18 1035    Subjective  Patient reports she is here today to be seen by her medical team for her newly diagnosed left breast cancer.    Pertinent History  Patient was diagnosed on 06/12/2018 with left grade I invasive ductal carcinoma breast cancer. It measures 9 mm and is located in the upper inner quadrant. It is ER/PR positive and HER2 negative with a Ki67 of 3%.    Patient Stated Goals  reduce lymphedema risk and learn post op shoulder ROM HEP    Currently in Pain?  No/denies          [facility] PT Assessment - 07/04/18 0001      Assessment   Medical Diagnosis  Left breast cancer    Referring Provider (PT)  Dr. Rolm Bookbinder    Onset Date/Surgical Date  06/12/18    Hand Dominance  Right    Prior Therapy  none      Precautions   Precautions  Other (comment)    Precaution Comments  active cancer      Restrictions   Weight Bearing Restrictions  No      Balance Screen   Has the patient fallen in the past 6 months  No    Has the patient had a decrease in activity level because of a fear of falling?   No    Is the patient reluctant to leave their home because of a fear of falling?   No      Home Social worker  Private residence    Living Arrangements  Spouse/significant other    Available Help at Discharge  Family      Prior Function   Level of [place]  Retired    Leisure  She walks a few times per week for 20-25 minutes with her dog      Cognition   Overall Cognitive Status  Within Functional Limits for tasks assessed      Posture/Postural Control   Posture/Postural Control  Postural limitations    Postural Limitations  Rounded Shoulders;Forward head      ROM / Strength   AROM / PROM / Strength  AROM;Strength      AROM   AROM Assessment Site  Shoulder;Cervical    Right/Left Shoulder  Right;Left    Right Shoulder Extension  53 Degrees    Right Shoulder Flexion  135 Degrees    Right Shoulder ABduction  149 Degrees    Right Shoulder Internal Rotation  68 Degrees    Right Shoulder External Rotation  74 Degrees    Left Shoulder Extension  53 Degrees    Left Shoulder Flexion  145 Degrees    Left Shoulder ABduction  150 Degrees    Left Shoulder Internal Rotation  51 Degrees    Left Shoulder External Rotation  80 Degrees      Strength   Overall Strength  Within functional limits for tasks performed        LYMPHEDEMA/ONCOLOGY QUESTIONNAIRE - 07/04/18 1045      Type   Cancer Type  Left breast  cancer      Lymphedema Assessments   Lymphedema Assessments  Upper extremities      Right Upper Extremity Lymphedema   10 cm Proximal to Olecranon Process  36.2 cm    Olecranon Process  29.1 cm    10 cm Proximal to Ulnar Styloid Process  27.6 cm    Just Proximal to Ulnar Styloid Process  19.2 cm    Across Hand at [employer]  20 cm    At [place] of 2nd Digit  6.9 cm      Left Upper Extremity Lymphedema   10 cm Proximal to Olecranon Process  34.4 cm    Olecranon Process  28.3 cm    10 cm Proximal to Ulnar Styloid Process  25.8 cm    Just Proximal to Ulnar Styloid Process  19.4 cm    Across Hand at [employer]  19.2 cm    At [place] of 2nd Digit  6.8 cm          Quick Dash - 07/04/18 0001    Open a tight or new jar  No difficulty    Do heavy household chores (wash walls, wash floors)  No difficulty    Carry a shopping bag or briefcase  No difficulty    Wash your back  No difficulty    Use a knife to cut food  No difficulty    Recreational activities in which you take some force or impact through your arm, shoulder, or hand (golf, hammering, tennis)  No difficulty    During the past week, to what extent has your arm, shoulder or hand problem interfered with your normal social activities with family, friends, neighbors, or groups?  Not at all    During the past week, to what extent has your arm, shoulder or hand problem limited your work or other regular daily activities  Not at all    Arm, shoulder, or hand pain.  Mild    Tingling (pins and needles) in your arm, shoulder, or hand  None    Difficulty Sleeping  Mild difficulty    DASH Score  4.55 %  Objective measurements completed on examination: See above findings.      Patient was instructed today in a home exercise program today for post op shoulder range of motion. These included active assist shoulder flexion in sitting, scapular retraction, wall walking with shoulder abduction, and hands behind head external  rotation.  She was encouraged to do these twice a day, holding 3 seconds and repeating 5 times when permitted by her physician.            PT Education - 07/04/18 1046    Education Details  Lymphedema risk reduction and post op shoulder ROM HEP    Person(s) Educated  Patient    Methods  Explanation;Demonstration;Handout    Comprehension  Returned demonstration;Verbalized understanding          PT Long Term Goals - 07/04/18 1155      PT LONG TERM GOAL #1   Title  Patient will demonstrate she has regained shoulder ROM and function post operatively compared to baselines.    Time  8    Period  Weeks    Status  New      Breast Clinic Goals - 07/04/18 1155      Patient will be able to verbalize understanding of pertinent lymphedema risk reduction practices relevant to her diagnosis specifically related to skin care.   Time  1    Period  Days    Status  Achieved      Patient will be able to return demonstrate and/or verbalize understanding of the post-op home exercise program related to regaining shoulder range of motion.   Time  1    Period  Days    Status  Achieved      Patient will be able to verbalize understanding of the importance of attending the postoperative After Breast Cancer Class for further lymphedema risk reduction education and therapeutic exercise.   Time  1    Status  Achieved            Plan - 07/04/18 1151    Clinical Impression Statement  Patient was diagnosed on 06/12/2018 with left grade I invasive ductal carcinoma breast cancer. It measures 9 mm and is located in the upper inner quadrant. It is ER/PR positive and HER2 negative with a Ki67 of 3%. Her multidisciplinary medical team met prior to her assessments to determine a recommended treatment plan. She is planning to have a left lumpectomy and sentinel node biopsy followed by radiation and anti-estrogen therapy. She will benefit from post op PT to reassess and determine needs.    Clinical  Presentation  Stable    Clinical Decision Making  Low    Rehab Potential  Excellent    Clinical Impairments Affecting Rehab Potential  None    PT Frequency  --   Eval and 1 f/u visit   PT Treatment/Interventions  ADLs/Self Care Home Management;Patient/family education;Therapeutic exercise    PT Next Visit Plan  Will f/u 3-4 weeks post op to reassess    PT Home Exercise Plan  Post op shoulder ROM HEP    Consulted and Agree with Plan of Care  Patient       Patient will benefit from skilled therapeutic intervention in order to improve the following deficits and impairments:  Impaired UE functional use, Pain, Postural dysfunction, Decreased knowledge of precautions, Decreased range of motion  Visit Diagnosis: Malignant neoplasm of upper-inner quadrant of left breast in female, estrogen receptor positive ([place]) - Plan: PT plan of care cert/re-cert  Abnormal  posture - Plan: PT plan of care cert/re-cert   Patient will follow up at outpatient cancer rehab 3-4 weeks following surgery.  If the patient requires physical therapy at that time, a specific plan will be dictated and sent to the referring physician for approval. The patient was educated today on appropriate basic range of motion exercises to begin post operatively and the importance of attending the After Breast Cancer class following surgery.  Patient was educated today on lymphedema risk reduction practices as it pertains to recommendations that will benefit the patient immediately following surgery.  She verbalized good understanding.      Problem List Patient Active Problem List   Diagnosis Date Noted  . Malignant neoplasm of upper-inner quadrant of left breast in female, estrogen receptor positive ([place]) 06/27/2018  . Left breast mass 06/17/2018  . Elevated random blood glucose level 07/28/2017  . Skin cancer screening 05/05/2017  . Colon cancer screening 07/21/2015  . Estrogen deficiency 07/21/2015  . Calcification of right  breast 05/06/2015  . Encounter for Medicare annual wellness exam 07/20/2014  . Unspecified constipation 10/25/2012  . Hepatic cirrhosis ([place]) 10/23/2012  . Hypokalemia 07/13/2012  . Thrombocytopenia ([place]) 11/10/2010  . Routine general medical examination at a health care facility 11/04/2010  . Hyperlipidemia 03/18/2008  . Obesity 03/18/2008  . TRANSAMINASES, SERUM, ELEVATED 03/18/2008  . MENOPAUSE-RELATED VASOMOTOR SYMPTOMS, HOT FLASHES 10/23/2006  . Hypothyroidism 10/11/2006  . GLAUCOMA 10/11/2006  . Essential hypertension 10/11/2006  . GERD 10/11/2006  . History of kidney stones 10/11/2006   Annia Friendly, PT 07/04/18 11:57 AM  [place], [place], [address] Phone: [phone]   Fax:  [phone]  Name: Selena Johnson MRN: [id number] Date of Birth: 1950/08/22

## 2018-07-06 ENCOUNTER — Encounter: Payer: Self-pay | Admitting: *Deleted

## 2018-07-06 DIAGNOSIS — Z170 Estrogen receptor positive status [ER+]: Secondary | ICD-10-CM

## 2018-07-06 DIAGNOSIS — C50212 Malignant neoplasm of upper-inner quadrant of left female breast: Secondary | ICD-10-CM

## 2018-07-09 ENCOUNTER — Encounter (HOSPITAL_COMMUNITY): Payer: Self-pay

## 2018-07-09 ENCOUNTER — Encounter (HOSPITAL_COMMUNITY)
Admission: RE | Admit: 2018-07-09 | Discharge: 2018-07-09 | Disposition: A | Discharge status: Home / Self Care | Payer: Medicare Other | Source: Ambulatory Visit | Attending: General Surgery | Admitting: General Surgery

## 2018-07-09 ENCOUNTER — Other Ambulatory Visit: Payer: Self-pay

## 2018-07-09 DIAGNOSIS — Z170 Estrogen receptor positive status [ER+]: Secondary | ICD-10-CM | POA: Diagnosis not present

## 2018-07-09 DIAGNOSIS — I10 Essential (primary) hypertension: Secondary | ICD-10-CM

## 2018-07-09 DIAGNOSIS — C50912 Malignant neoplasm of unspecified site of left female breast: Secondary | ICD-10-CM | POA: Diagnosis present

## 2018-07-09 DIAGNOSIS — Z90710 Acquired absence of both cervix and uterus: Secondary | ICD-10-CM | POA: Diagnosis not present

## 2018-07-09 DIAGNOSIS — Z6836 Body mass index (BMI) 36.0-36.9, adult: Secondary | ICD-10-CM | POA: Diagnosis not present

## 2018-07-09 DIAGNOSIS — E669 Obesity, unspecified: Secondary | ICD-10-CM | POA: Diagnosis not present

## 2018-07-09 DIAGNOSIS — K7469 Other cirrhosis of liver: Secondary | ICD-10-CM | POA: Diagnosis not present

## 2018-07-09 DIAGNOSIS — E039 Hypothyroidism, unspecified: Secondary | ICD-10-CM | POA: Diagnosis not present

## 2018-07-09 DIAGNOSIS — K7581 Nonalcoholic steatohepatitis (NASH): Secondary | ICD-10-CM | POA: Diagnosis not present

## 2018-07-09 DIAGNOSIS — Z01818 Encounter for other preprocedural examination: Secondary | ICD-10-CM

## 2018-07-09 DIAGNOSIS — Z87442 Personal history of urinary calculi: Secondary | ICD-10-CM | POA: Diagnosis not present

## 2018-07-09 DIAGNOSIS — K219 Gastro-esophageal reflux disease without esophagitis: Secondary | ICD-10-CM | POA: Diagnosis not present

## 2018-07-09 DIAGNOSIS — E7800 Pure hypercholesterolemia, unspecified: Secondary | ICD-10-CM | POA: Diagnosis not present

## 2018-07-09 DIAGNOSIS — Z85820 Personal history of malignant melanoma of skin: Secondary | ICD-10-CM | POA: Diagnosis not present

## 2018-07-09 DIAGNOSIS — D0512 Intraductal carcinoma in situ of left breast: Secondary | ICD-10-CM | POA: Diagnosis not present

## 2018-07-09 DIAGNOSIS — I8510 Secondary esophageal varices without bleeding: Secondary | ICD-10-CM | POA: Diagnosis not present

## 2018-07-09 DIAGNOSIS — Z87891 Personal history of nicotine dependence: Secondary | ICD-10-CM | POA: Diagnosis not present

## 2018-07-09 DIAGNOSIS — D696 Thrombocytopenia, unspecified: Secondary | ICD-10-CM | POA: Diagnosis not present

## 2018-07-09 DIAGNOSIS — Z9049 Acquired absence of other specified parts of digestive tract: Secondary | ICD-10-CM | POA: Diagnosis not present

## 2018-07-09 HISTORY — DX: Personal history of urinary calculi: Z87.442

## 2018-07-09 LAB — BASIC METABOLIC PANEL
Anion gap: 11 (ref 5–15)
BUN: 10 mg/dL (ref 8–23)
CO2: 24 mmol/L (ref 22–32)
Calcium: 9.5 mg/dL (ref 8.9–10.3)
Chloride: 107 mmol/L (ref 98–111)
Creatinine, Ser: 0.8 mg/dL (ref 0.44–1.00)
GFR calc Af Amer: >60 mL/min (ref >60)
GFR calc non Af Amer: >60 mL/min (ref >60)
Glucose, Bld: 94 mg/dL (ref 70–99)
Potassium: 3.5 mmol/L (ref 3.5–5.1)
Sodium: 142 mmol/L (ref 135–145)

## 2018-07-09 LAB — CBC
HCT: 42.9 % (ref 36.0–46.0)
Hemoglobin: 14.4 g/dL (ref 12.0–15.0)
MCH: 33.8 pg (ref 26.0–34.0)
MCHC: 33.6 g/dL (ref 30.0–36.0)
MCV: 100.7 fL — ABNORMAL HIGH (ref 80.0–100.0)
PLATELETS: 70 10*3/uL — AB (ref 150–400)
RBC: 4.26 MIL/uL (ref 3.87–5.11)
RDW: 12.9 % (ref 11.5–15.5)
WBC: 4.8 10*3/uL (ref 4.0–10.5)
nRBC: 0 % (ref 0.0–0.2)

## 2018-07-09 MED ORDER — ENSURE PRE-SURGERY PO LIQD
296.0000 mL | Freq: Once | ORAL | Status: DC
  Filled 2018-07-09: qty 296

## 2018-07-09 NOTE — Pre-Procedure Instructions (Signed)
Selena Johnson  07/09/2018      [facility]) - [place], [place] [address] Phone: [phone] Fax: [place] [id number] [place], [place] - [address] [place] [address] Phone: [phone] Fax: [phone]    Your procedure is scheduled on Thursday, February 6..  Report to [facility] Admitting at 7:00AM                    Your surgery or procedure is scheduled for 9:00 A.M.   Call this number if you have problems the morning of surgery: [phone]  This is the number for the Pre- Surgical Desk.    Remember:  Do not eat  after midnight Wednesday, February 5.  You may drink clear liquids until 6:00AM .  Clear liquids allowed are:   Water, Juice (non-citric and without pulp), Carbonated beverages, Clear Tea, Black Coffee only, Plain Jell-O only, Gatorade and Plain Popsicles only                Drink the Pre- Surgery Ensure the morning of surgery, finish it by 6:00 AM    Take these medicines the morning of surgery with A SIP OF WATER :  levothyroxine (SYNTHROID, LEVOTHROID)             metoprolol succinate (TOPROL-XL)  STOP taking Aspirin, Aspirin Products (Goody Powder, Excedrin Migraine), Ibuprofen (Advil), Naproxen (Aleve), Vitamins and Herbal Products (ie Fish Oil).  Special instructions:   [place]- Preparing For Surgery  Before surgery, you can play an important role. Because skin is not sterile, your skin needs to be as free of germs as possible. You can reduce the number of germs on your skin by washing with CHG (chlorahexidine gluconate) Soap before surgery.  CHG is an antiseptic cleaner which kills germs and bonds with the skin to continue killing germs even after washing.    Oral Hygiene is also important to reduce your risk of infection.  Remember - BRUSH YOUR TEETH THE MORNING OF SURGERY WITH  YOUR REGULAR TOOTHPASTE  Please do not use if you have an allergy to CHG or antibacterial soaps. If your skin becomes reddened/irritated stop using the CHG.  Do not shave (including legs and underarms) for at least 48 hours prior to first CHG shower. It is OK to shave your face.  Please follow these instructions carefully.   1. Shower the NIGHT BEFORE SURGERY and the MORNING OF SURGERY with CHG.   2. If you chose to wash your hair, wash your hair first as usual with your normal shampoo.  3. After you shampoo, rinse your hair and body thoroughly to remove the shampoo.  4. Use CHG as you would any other liquid soap. You can apply CHG directly to the skin and wash gently with a scrungie or a clean washcloth.   5. Apply the CHG Soap to your body ONLY FROM THE NECK DOWN.  Do not use on open wounds or open sores. Avoid contact with your eyes, ears, mouth and genitals (private parts). Wash Face and genitals (private parts)  with your normal soap.  6. Wash thoroughly, paying special attention to the area where your surgery will be performed.  7. Thoroughly rinse your body with warm water from the neck down.  8. DO NOT shower/wash with your normal  soap after using and rinsing off the CHG Soap.  9. Pat yourself dry with a CLEAN TOWEL.  10. Wear CLEAN PAJAMAS to bed the night before surgery, wear comfortable clothes the morning of surgery  11. Place CLEAN SHEETS on your bed the night of your first shower and DO NOT SLEEP WITH PETS.    Day of Surgery:  Shower as written above Do not apply any deodorants/lotions.  Please wear clean clothes to the hospital/surgery center.   Remember to brush your teeth WITH YOUR REGULAR TOOTHPASTE.  Do not wear jewelry, make-up or nail polish.  Do not wear lotions, powders, or perfumes, or deodorant.  Do not shave 48 hours prior to surgery.  Men may shave face and neck.  Do not bring valuables to the hospital.  [facility] is not responsible for any  belongings or valuables.  Contacts, dentures or bridgework may not be worn into surgery.  Leave your suitcase in the car.  After surgery it may be brought to your room.  For patients admitted to the hospital, discharge time will be determined by your treatment team.  Patients discharged the day of surgery will not be allowed to drive home.   Please read over the following fact sheets that you were given.

## 2018-07-09 NOTE — Progress Notes (Signed)
PCP - Dr. Loura Pardon Cardiologist - denies  Patient is scheduled for Radioactive Seed placement 07/11/18 at [facility] with [facility].   Chest x-ray - N/A EKG - 07/09/18 Stress Test - "years ago with [facility]"  ECHO - denies Cardiac Cath - denies  Sleep Study - denies  Aspirin Instructions: Patient instructed to hold all Aspirin, NSAID's, herbal medications, fish oil and vitamins 7 days prior to surgery.   Anesthesia review:   Patient denies shortness of breath, fever, cough and chest pain at PAT appointment   Patient verbalized understanding of instructions that were given to them at the PAT appointment. Patient was also instructed that they will need to review over the PAT instructions again at home before surgery.

## 2018-07-10 NOTE — Anesthesia Preprocedure Evaluation (Addendum)
Anesthesia Evaluation  Patient identified by MRN, date of birth, ID band Patient awake    Reviewed: Allergy & Precautions, NPO status , Patient's Chart, lab work & pertinent test results  History of Anesthesia Complications Negative for: history of anesthetic complications  Airway Mallampati: III  TM Distance: >3 FB Neck ROM: Full    Dental no notable dental hx. (+) Teeth Intact   Pulmonary neg pulmonary ROS, former smoker,    Pulmonary exam normal        Cardiovascular hypertension, Normal cardiovascular exam     Neuro/Psych negative neurological ROS  negative psych ROS   GI/Hepatic GERD  ,(+) Cirrhosis   Esophageal Varices    ,   Endo/Other  Hypothyroidism   Renal/GU negative Renal ROS  negative genitourinary   Musculoskeletal negative musculoskeletal ROS (+)   Abdominal (+) + obese,   Peds  Hematology  (+) Blood dyscrasia (chronic thrombocytopenia), ,   Anesthesia Other Findings 68 yo F for left lumpectomy and ax LND - HTN, GERD, NASH cirrhosis w/ esophageal varices & thrombocytopenia (plts 68), hypothyroid  Reproductive/Obstetrics                           Anesthesia Physical Anesthesia Plan  ASA: III  Anesthesia Plan: General   Post-op Pain Management: GA combined w/ Regional for post-op pain   Induction: Intravenous  PONV Risk Score and Plan: 3 and Ondansetron, Dexamethasone, Midazolam and Treatment may vary due to age or medical condition  Airway Management Planned: LMA  Additional Equipment: None  Intra-op Plan:   Post-operative Plan: Extubation in OR  Informed Consent: I have reviewed the patients History and Physical, chart, labs and discussed the procedure including the risks, benefits and alternatives for the proposed anesthesia with the patient or authorized representative who has indicated his/her understanding and acceptance.     Dental advisory  given  Plan Discussed with:   Anesthesia Plan Comments: (Thrombocytopenia in the setting of NASH. Appears stable, baseline platelets ~70k. Platelets 70k on preop labs.)      Anesthesia Quick Evaluation

## 2018-07-11 DIAGNOSIS — C50212 Malignant neoplasm of upper-inner quadrant of left female breast: Secondary | ICD-10-CM | POA: Diagnosis not present

## 2018-07-12 ENCOUNTER — Ambulatory Visit (HOSPITAL_COMMUNITY): Payer: Medicare Other | Admitting: Anesthesiology

## 2018-07-12 ENCOUNTER — Ambulatory Visit (HOSPITAL_COMMUNITY)
Admission: RE | Admit: 2018-07-12 | Discharge: 2018-07-12 | Disposition: A | Discharge status: Home / Self Care | Payer: Medicare Other | Attending: General Surgery | Admitting: General Surgery

## 2018-07-12 ENCOUNTER — Ambulatory Visit (HOSPITAL_COMMUNITY): Payer: Medicare Other | Admitting: Vascular Surgery

## 2018-07-12 ENCOUNTER — Encounter (HOSPITAL_COMMUNITY)
Admission: RE | Admit: 2018-07-12 | Discharge: 2018-07-12 | Disposition: A | Discharge status: Home / Self Care | Payer: Medicare Other | Source: Ambulatory Visit | Attending: General Surgery | Admitting: General Surgery

## 2018-07-12 ENCOUNTER — Encounter (HOSPITAL_COMMUNITY): Payer: Self-pay | Admitting: Certified Registered Nurse Anesthetist

## 2018-07-12 ENCOUNTER — Encounter (HOSPITAL_COMMUNITY)
Admission: RE | Disposition: A | Discharge status: Home / Self Care | Payer: Self-pay | Source: Home / Self Care | Attending: General Surgery

## 2018-07-12 ENCOUNTER — Other Ambulatory Visit: Payer: Self-pay

## 2018-07-12 DIAGNOSIS — K219 Gastro-esophageal reflux disease without esophagitis: Secondary | ICD-10-CM | POA: Insufficient documentation

## 2018-07-12 DIAGNOSIS — Z90710 Acquired absence of both cervix and uterus: Secondary | ICD-10-CM | POA: Insufficient documentation

## 2018-07-12 DIAGNOSIS — K7469 Other cirrhosis of liver: Secondary | ICD-10-CM | POA: Insufficient documentation

## 2018-07-12 DIAGNOSIS — I8510 Secondary esophageal varices without bleeding: Secondary | ICD-10-CM | POA: Insufficient documentation

## 2018-07-12 DIAGNOSIS — C50212 Malignant neoplasm of upper-inner quadrant of left female breast: Secondary | ICD-10-CM | POA: Diagnosis not present

## 2018-07-12 DIAGNOSIS — I10 Essential (primary) hypertension: Secondary | ICD-10-CM | POA: Insufficient documentation

## 2018-07-12 DIAGNOSIS — C50912 Malignant neoplasm of unspecified site of left female breast: Secondary | ICD-10-CM | POA: Diagnosis not present

## 2018-07-12 DIAGNOSIS — Z170 Estrogen receptor positive status [ER+]: Secondary | ICD-10-CM

## 2018-07-12 DIAGNOSIS — Z6836 Body mass index (BMI) 36.0-36.9, adult: Secondary | ICD-10-CM | POA: Insufficient documentation

## 2018-07-12 DIAGNOSIS — D0512 Intraductal carcinoma in situ of left breast: Secondary | ICD-10-CM | POA: Diagnosis not present

## 2018-07-12 DIAGNOSIS — Z87442 Personal history of urinary calculi: Secondary | ICD-10-CM | POA: Insufficient documentation

## 2018-07-12 DIAGNOSIS — E039 Hypothyroidism, unspecified: Secondary | ICD-10-CM | POA: Insufficient documentation

## 2018-07-12 DIAGNOSIS — Z9049 Acquired absence of other specified parts of digestive tract: Secondary | ICD-10-CM | POA: Diagnosis not present

## 2018-07-12 DIAGNOSIS — E669 Obesity, unspecified: Secondary | ICD-10-CM | POA: Insufficient documentation

## 2018-07-12 DIAGNOSIS — E785 Hyperlipidemia, unspecified: Secondary | ICD-10-CM | POA: Diagnosis not present

## 2018-07-12 DIAGNOSIS — D696 Thrombocytopenia, unspecified: Secondary | ICD-10-CM | POA: Insufficient documentation

## 2018-07-12 DIAGNOSIS — Z87891 Personal history of nicotine dependence: Secondary | ICD-10-CM | POA: Diagnosis not present

## 2018-07-12 DIAGNOSIS — K7581 Nonalcoholic steatohepatitis (NASH): Secondary | ICD-10-CM | POA: Insufficient documentation

## 2018-07-12 DIAGNOSIS — Z85820 Personal history of malignant melanoma of skin: Secondary | ICD-10-CM | POA: Diagnosis not present

## 2018-07-12 DIAGNOSIS — G8918 Other acute postprocedural pain: Secondary | ICD-10-CM | POA: Diagnosis not present

## 2018-07-12 DIAGNOSIS — E7800 Pure hypercholesterolemia, unspecified: Secondary | ICD-10-CM | POA: Diagnosis not present

## 2018-07-12 HISTORY — PX: BREAST LUMPECTOMY WITH RADIOACTIVE SEED AND SENTINEL LYMPH NODE BIOPSY: SHX6550

## 2018-07-12 SURGERY — BREAST LUMPECTOMY WITH RADIOACTIVE SEED AND SENTINEL LYMPH NODE BIOPSY
Anesthesia: General | Site: Breast | Laterality: Left

## 2018-07-12 MED ORDER — ACETAMINOPHEN 500 MG PO TABS
ORAL_TABLET | ORAL | Status: AC
  Administered 2018-07-12: 1000 mg via ORAL
  Filled 2018-07-12: qty 2

## 2018-07-12 MED ORDER — ONDANSETRON HCL 4 MG/2ML IJ SOLN
INTRAMUSCULAR | Status: AC
  Filled 2018-07-12: qty 2

## 2018-07-12 MED ORDER — OXYCODONE HCL 5 MG PO TABS: 5.0000 mg | ORAL_TABLET | ORAL | Status: DC | PRN

## 2018-07-12 MED ORDER — EPHEDRINE SULFATE-NACL 50-0.9 MG/10ML-% IV SOSY
PREFILLED_SYRINGE | INTRAVENOUS | Status: DC | PRN
  Administered 2018-07-12: 5 mg via INTRAVENOUS
  Administered 2018-07-12 (×2): 10 mg via INTRAVENOUS

## 2018-07-12 MED ORDER — TECHNETIUM TC 99M SULFUR COLLOID FILTERED
1.0000 | Freq: Once | INTRAVENOUS | Status: AC | PRN
  Administered 2018-07-12: 1 via INTRADERMAL

## 2018-07-12 MED ORDER — FENTANYL CITRATE (PF) 100 MCG/2ML IJ SOLN
INTRAMUSCULAR | Status: DC | PRN
  Administered 2018-07-12: 25 ug via INTRAVENOUS

## 2018-07-12 MED ORDER — ACETAMINOPHEN 325 MG PO TABS: 650.0000 mg | ORAL_TABLET | ORAL | Status: DC | PRN

## 2018-07-12 MED ORDER — PHENYLEPHRINE 40 MCG/ML (10ML) SYRINGE FOR IV PUSH (FOR BLOOD PRESSURE SUPPORT)
PREFILLED_SYRINGE | INTRAVENOUS | Status: AC
  Filled 2018-07-12: qty 10

## 2018-07-12 MED ORDER — EPHEDRINE 5 MG/ML INJ
INTRAVENOUS | Status: AC
  Filled 2018-07-12: qty 10

## 2018-07-12 MED ORDER — BUPIVACAINE-EPINEPHRINE 0.5% -1:200000 IJ SOLN
INTRAMUSCULAR | Status: DC | PRN
  Administered 2018-07-12: 8 mL

## 2018-07-12 MED ORDER — FENTANYL CITRATE (PF) 100 MCG/2ML IJ SOLN: 25.0000 ug | INTRAMUSCULAR | Status: DC | PRN

## 2018-07-12 MED ORDER — FENTANYL CITRATE (PF) 250 MCG/5ML IJ SOLN
INTRAMUSCULAR | Status: AC
  Filled 2018-07-12: qty 5

## 2018-07-12 MED ORDER — ONDANSETRON HCL 4 MG/2ML IJ SOLN
INTRAMUSCULAR | Status: DC | PRN
  Administered 2018-07-12: 4 mg via INTRAVENOUS

## 2018-07-12 MED ORDER — DEXAMETHASONE SODIUM PHOSPHATE 10 MG/ML IJ SOLN
INTRAMUSCULAR | Status: AC
  Filled 2018-07-12: qty 1

## 2018-07-12 MED ORDER — LACTATED RINGERS IV SOLN
INTRAVENOUS | Status: DC
  Administered 2018-07-12: 08:00:00 via INTRAVENOUS

## 2018-07-12 MED ORDER — MIDAZOLAM HCL 2 MG/2ML IJ SOLN
INTRAMUSCULAR | Status: AC
  Administered 2018-07-12: 1 mg via INTRAVENOUS
  Filled 2018-07-12: qty 2

## 2018-07-12 MED ORDER — CEFAZOLIN SODIUM-DEXTROSE 2-4 GM/100ML-% IV SOLN
2.0000 g | INTRAVENOUS | Status: AC
  Administered 2018-07-12: 2 g via INTRAVENOUS

## 2018-07-12 MED ORDER — DEXAMETHASONE SODIUM PHOSPHATE 10 MG/ML IJ SOLN
INTRAMUSCULAR | Status: DC | PRN
  Administered 2018-07-12: 10 mg via INTRAVENOUS

## 2018-07-12 MED ORDER — LIDOCAINE 2% (20 MG/ML) 5 ML SYRINGE
INTRAMUSCULAR | Status: AC
  Filled 2018-07-12: qty 5

## 2018-07-12 MED ORDER — SODIUM CHLORIDE 0.9% FLUSH: 3.0000 mL | INTRAVENOUS | Status: DC | PRN

## 2018-07-12 MED ORDER — CEFAZOLIN SODIUM-DEXTROSE 2-4 GM/100ML-% IV SOLN
INTRAVENOUS | Status: AC
  Filled 2018-07-12: qty 100

## 2018-07-12 MED ORDER — PROPOFOL 10 MG/ML IV BOLUS
INTRAVENOUS | Status: DC | PRN
  Administered 2018-07-12: 160 mg via INTRAVENOUS
  Administered 2018-07-12: 40 mg via INTRAVENOUS

## 2018-07-12 MED ORDER — OXYCODONE HCL 5 MG/5ML PO SOLN: 5.0000 mg | Freq: Once | ORAL | Status: DC | PRN

## 2018-07-12 MED ORDER — SODIUM CHLORIDE 0.9 % IV SOLN: 250.0000 mL | INTRAVENOUS | Status: DC | PRN

## 2018-07-12 MED ORDER — ONDANSETRON HCL 4 MG/2ML IJ SOLN: 4.0000 mg | Freq: Once | INTRAMUSCULAR | Status: DC | PRN

## 2018-07-12 MED ORDER — MORPHINE SULFATE (PF) 2 MG/ML IV SOLN: 1.0000 mg | INTRAVENOUS | Status: DC | PRN

## 2018-07-12 MED ORDER — LIDOCAINE 2% (20 MG/ML) 5 ML SYRINGE
INTRAMUSCULAR | Status: DC | PRN
  Administered 2018-07-12: 60 mg via INTRAVENOUS

## 2018-07-12 MED ORDER — ACETAMINOPHEN 650 MG RE SUPP: 650.0000 mg | RECTAL | Status: DC | PRN

## 2018-07-12 MED ORDER — FENTANYL CITRATE (PF) 100 MCG/2ML IJ SOLN: 100.0000 ug | Freq: Once | INTRAMUSCULAR | Status: DC

## 2018-07-12 MED ORDER — SODIUM CHLORIDE 0.9% FLUSH: 3.0000 mL | Freq: Two times a day (BID) | INTRAVENOUS | Status: DC

## 2018-07-12 MED ORDER — 0.9 % SODIUM CHLORIDE (POUR BTL) OPTIME
TOPICAL | Status: DC | PRN
  Administered 2018-07-12: 1000 mL

## 2018-07-12 MED ORDER — METHYLENE BLUE 0.5 % INJ SOLN
INTRAVENOUS | Status: AC
  Filled 2018-07-12: qty 10

## 2018-07-12 MED ORDER — PROPOFOL 10 MG/ML IV BOLUS
INTRAVENOUS | Status: AC
  Filled 2018-07-12: qty 20

## 2018-07-12 MED ORDER — SODIUM CHLORIDE (PF) 0.9 % IJ SOLN
INTRAMUSCULAR | Status: AC
  Filled 2018-07-12: qty 10

## 2018-07-12 MED ORDER — GABAPENTIN 100 MG PO CAPS
ORAL_CAPSULE | ORAL | Status: AC
  Administered 2018-07-12: 100 mg via ORAL
  Filled 2018-07-12: qty 1

## 2018-07-12 MED ORDER — MIDAZOLAM HCL 2 MG/2ML IJ SOLN
1.0000 mg | Freq: Once | INTRAMUSCULAR | Status: AC
  Administered 2018-07-12: 1 mg via INTRAVENOUS

## 2018-07-12 MED ORDER — OXYCODONE HCL 5 MG PO TABS: 5.0000 mg | ORAL_TABLET | Freq: Four times a day (QID) | ORAL | 0 refills | Status: DC | PRN

## 2018-07-12 MED ORDER — FENTANYL CITRATE (PF) 100 MCG/2ML IJ SOLN
INTRAMUSCULAR | Status: AC
  Filled 2018-07-12: qty 2

## 2018-07-12 MED ORDER — ROPIVACAINE HCL 5 MG/ML IJ SOLN
INTRAMUSCULAR | Status: DC | PRN
  Administered 2018-07-12: 30 mL via PERINEURAL

## 2018-07-12 MED ORDER — ACETAMINOPHEN 500 MG PO TABS
1000.0000 mg | ORAL_TABLET | ORAL | Status: AC
  Administered 2018-07-12: 1000 mg via ORAL

## 2018-07-12 MED ORDER — OXYCODONE HCL 5 MG PO TABS: 5.0000 mg | ORAL_TABLET | Freq: Once | ORAL | Status: DC | PRN

## 2018-07-12 MED ORDER — GABAPENTIN 100 MG PO CAPS
100.0000 mg | ORAL_CAPSULE | ORAL | Status: AC
  Administered 2018-07-12: 100 mg via ORAL

## 2018-07-12 MED ORDER — PHENYLEPHRINE 40 MCG/ML (10ML) SYRINGE FOR IV PUSH (FOR BLOOD PRESSURE SUPPORT)
PREFILLED_SYRINGE | INTRAVENOUS | Status: DC | PRN
  Administered 2018-07-12 (×3): 80 ug via INTRAVENOUS

## 2018-07-12 SURGICAL SUPPLY — 55 items
ADH SKN CLS APL DERMABOND .7 (GAUZE/BANDAGES/DRESSINGS) ×1
APPLIER CLIP 9.375 MED OPEN (MISCELLANEOUS)
APR CLP MED 9.3 20 MLT OPN (MISCELLANEOUS)
BINDER BREAST LRG (GAUZE/BANDAGES/DRESSINGS) IMPLANT
BINDER BREAST XLRG (GAUZE/BANDAGES/DRESSINGS) IMPLANT
BLADE SURG 15 STRL LF DISP TIS (BLADE) ×1 IMPLANT
BLADE SURG 15 STRL SS (BLADE) ×2
CANISTER SUCT 3000ML PPV (MISCELLANEOUS) ×2 IMPLANT
CHLORAPREP W/TINT 26ML (MISCELLANEOUS) ×2 IMPLANT
CLIP APPLIE 9.375 MED OPEN (MISCELLANEOUS) IMPLANT
CLIP VESOCCLUDE MED 6/CT (CLIP) ×2 IMPLANT
COVER PROBE W GEL 5X96 (DRAPES) ×2 IMPLANT
COVER SURGICAL LIGHT HANDLE (MISCELLANEOUS) ×2 IMPLANT
COVER WAND RF STERILE (DRAPES) ×2 IMPLANT
DERMABOND ADVANCED (GAUZE/BANDAGES/DRESSINGS) ×1
DERMABOND ADVANCED .7 DNX12 (GAUZE/BANDAGES/DRESSINGS) ×1 IMPLANT
DEVICE DUBIN SPECIMEN MAMMOGRA (MISCELLANEOUS) ×2 IMPLANT
DRAPE CHEST BREAST 15X10 FENES (DRAPES) ×2 IMPLANT
DRAPE UTILITY XL STRL (DRAPES) ×2 IMPLANT
ELECT COATED BLADE 2.86 ST (ELECTRODE) ×2 IMPLANT
ELECT REM PT RETURN 9FT ADLT (ELECTROSURGICAL) ×2
ELECTRODE REM PT RTRN 9FT ADLT (ELECTROSURGICAL) ×1 IMPLANT
GLOVE BIO SURGEON STRL SZ7 (GLOVE) ×2 IMPLANT
GLOVE BIOGEL PI IND STRL 7.5 (GLOVE) ×1 IMPLANT
GLOVE BIOGEL PI INDICATOR 7.5 (GLOVE) ×1
GOWN STRL REUS W/ TWL LRG LVL3 (GOWN DISPOSABLE) ×2 IMPLANT
GOWN STRL REUS W/TWL LRG LVL3 (GOWN DISPOSABLE) ×4
ILLUMINATOR WAVEGUIDE N/F (MISCELLANEOUS) IMPLANT
KIT BASIN OR (CUSTOM PROCEDURE TRAY) ×2 IMPLANT
KIT MARKER MARGIN INK (KITS) ×2 IMPLANT
MARKER SKIN DUAL TIP RULER LAB (MISCELLANEOUS) ×2 IMPLANT
NDL FILTER BLUNT 18X1 1/2 (NEEDLE) IMPLANT
NDL HYPO 25GX1X1/2 BEV (NEEDLE) ×1 IMPLANT
NDL SAFETY ECLIPSE 18X1.5 (NEEDLE) IMPLANT
NEEDLE FILTER BLUNT 18X 1/2SAF (NEEDLE)
NEEDLE FILTER BLUNT 18X1 1/2 (NEEDLE) IMPLANT
NEEDLE HYPO 18GX1.5 SHARP (NEEDLE)
NEEDLE HYPO 25GX1X1/2 BEV (NEEDLE) ×2 IMPLANT
NS IRRIG 1000ML POUR BTL (IV SOLUTION) ×2 IMPLANT
PACK SURGICAL SETUP 50X90 (CUSTOM PROCEDURE TRAY) ×2 IMPLANT
PENCIL BUTTON HOLSTER BLD 10FT (ELECTRODE) ×2 IMPLANT
SPONGE LAP 18X18 X RAY DECT (DISPOSABLE) ×2 IMPLANT
STRIP CLOSURE SKIN 1/2X4 (GAUZE/BANDAGES/DRESSINGS) ×2 IMPLANT
SUT MNCRL AB 4-0 PS2 18 (SUTURE) ×4 IMPLANT
SUT SILK 2 0 SH (SUTURE) IMPLANT
SUT VIC AB 2-0 SH 27 (SUTURE) ×4
SUT VIC AB 2-0 SH 27XBRD (SUTURE) ×2 IMPLANT
SUT VIC AB 3-0 SH 27 (SUTURE) ×4
SUT VIC AB 3-0 SH 27X BRD (SUTURE) ×2 IMPLANT
SYR BULB 3OZ (MISCELLANEOUS) ×2 IMPLANT
SYR CONTROL 10ML LL (SYRINGE) ×2 IMPLANT
TOWEL OR 17X24 6PK STRL BLUE (TOWEL DISPOSABLE) ×2 IMPLANT
TOWEL OR 17X26 10 PK STRL BLUE (TOWEL DISPOSABLE) ×2 IMPLANT
TUBE CONNECTING 12X1/4 (SUCTIONS) ×2 IMPLANT
YANKAUER SUCT BULB TIP NO VENT (SUCTIONS) ×2 IMPLANT

## 2018-07-12 NOTE — Anesthesia Postprocedure Evaluation (Signed)
Anesthesia Post Note  Patient: Selena Johnson  Procedure(s) Performed: LEFT BREAST LUMPECTOMY WITH RADIOACTIVE SEED AND LEFT AXILLARY SENTINEL LYMPH NODE BIOPSY (Left Breast)     Patient location during evaluation: PACU Anesthesia Type: General Level of consciousness: awake and alert Pain management: pain level controlled Vital Signs Assessment: post-procedure vital signs reviewed and stable Respiratory status: spontaneous breathing, nonlabored ventilation and respiratory function stable Cardiovascular status: blood pressure returned to baseline and stable Postop Assessment: no apparent nausea or vomiting Anesthetic complications: no    Last Vitals:  Vitals:   07/12/18 1030 07/12/18 1045  BP: 134/67 130/71  Pulse: 81 76  Resp: 20 16  Temp:  (!) 36.4 C  SpO2: 97% 99%    Last Pain:  Vitals:   07/12/18 1030  TempSrc:   PainSc: 0-No pain                 Lidia Collum

## 2018-07-12 NOTE — Discharge Instructions (Signed)
[facility] Office Phone Number [phone]  POST OP INSTRUCTIONS Take 400 mg of ibuprofen every 8 hours or 650 mg tylenol every 6 hours for next 72 hours then as needed. Use ice several times daily also. Always review your discharge instruction sheet given to you by the facility where your surgery was performed.  IF YOU HAVE DISABILITY OR FAMILY LEAVE FORMS, YOU MUST BRING THEM TO THE OFFICE FOR PROCESSING.  DO NOT GIVE THEM TO YOUR DOCTOR.  1. A prescription for pain medication may be given to you upon discharge.  Take your pain medication as prescribed, if needed.  If narcotic pain medicine is not needed, then you may take acetaminophen (Tylenol), naprosyn (Alleve) or ibuprofen (Advil) as needed. 2. Take your usually prescribed medications unless otherwise directed 3. If you need a refill on your pain medication, please contact your pharmacy.  They will contact our office to request authorization.  Prescriptions will not be filled after 5pm or on week-ends. 4. You should eat very light the first 24 hours after surgery, such as soup, crackers, pudding, etc.  Resume your normal diet the day after surgery. 5. Most patients will experience some swelling and bruising in the breast.  Ice packs and a good support bra will help.  Wear the breast binder provided or a sports bra for 72 hours day and night.  After that wear a sports bra during the day until you return to the office. Swelling and bruising can take several days to resolve.  6. It is common to experience some constipation if taking pain medication after surgery.  Increasing fluid intake and taking a stool softener will usually help or prevent this problem from occurring.  A mild laxative (Milk of Magnesia or Miralax) should be taken according to package directions if there are no bowel movements after 48 hours. 7. Unless discharge instructions indicate otherwise, you may remove your bandages 48 hours after surgery and you may  shower at that time.  You may have steri-strips (small skin tapes) in place directly over the incision.  These strips should be left on the skin for 7-10 days and will come off on their own.  If your surgeon used skin glue on the incision, you may shower in 24 hours.  The glue will flake off over the next 2-3 weeks.  Any sutures or staples will be removed at the office during your follow-up visit. 8. ACTIVITIES:  You may resume regular daily activities (gradually increasing) beginning the next day.  Wearing a good support bra or sports bra minimizes pain and swelling.  You may have sexual intercourse when it is comfortable. a. You may drive when you no longer are taking prescription pain medication, you can comfortably wear a seatbelt, and you can safely maneuver your car and apply brakes. b. RETURN TO WORK:  ______________________________________________________________________________________ 9. You should see your doctor in the office for a follow-up appointment approximately two weeks after your surgery.  Your doctors nurse will typically make your follow-up appointment when she calls you with your pathology report.  Expect your pathology report 3-4 business days after your surgery.  You may call to check if you do not hear from [place] after three days. 10. OTHER INSTRUCTIONS: _______________________________________________________________________________________________ _____________________________________________________________________________________________________________________________________ _____________________________________________________________________________________________________________________________________ _____________________________________________________________________________________________________________________________________  WHEN TO CALL DR Tawnee Clegg: 1. Fever over 101.0 2. Nausea and/or vomiting. 3. Extreme swelling or bruising. 4. Continued bleeding from  incision. 5. Increased pain, redness, or drainage from the incision.  The clinic staff is available to  answer your questions during regular business hours.  Please dont hesitate to call and ask to speak to one of the nurses for clinical concerns.  If you have a medical emergency, go to the nearest emergency room or call 911.  A surgeon from [facility] is always on call at the hospital.  For further questions, please visit [email or web address]

## 2018-07-12 NOTE — Interval H&P Note (Signed)
History and Physical Interval Note:  07/12/2018 8:10 AM  Selena Johnson  has presented today for surgery, with the diagnosis of LEFT BREAST CANCER  The various methods of treatment have been discussed with the patient and family. After consideration of risks, benefits and other options for treatment, the patient has consented to  Procedure(s): LEFT BREAST LUMPECTOMY WITH RADIOACTIVE SEED AND LEFT AXILLARY SENTINEL LYMPH NODE BIOPSY (Left) as a surgical intervention .  The patient's history has been reviewed, patient examined, no change in status, stable for surgery.  I have reviewed the patient's chart and labs.  Questions were answered to the patient's satisfaction.     Rolm Bookbinder

## 2018-07-12 NOTE — Progress Notes (Signed)
Per Nuclear Med department, they will not receive any injection medicine until 9am. Dr. Christella Hartigan made aware. Will relay to Dr. Donne Hazel.

## 2018-07-12 NOTE — Transfer of Care (Signed)
Immediate Anesthesia Transfer of Care Note  Patient: Selena Johnson  Procedure(s) Performed: LEFT BREAST LUMPECTOMY WITH RADIOACTIVE SEED AND LEFT AXILLARY SENTINEL LYMPH NODE BIOPSY (Left Breast)  Patient Location: PACU  Anesthesia Type:GA combined with regional for post-op pain  Level of Consciousness: awake, alert  and oriented  Airway & Oxygen Therapy: Patient Spontanous Breathing and Patient connected to face mask oxygen  Post-op Assessment: Report given to RN and Post -op Vital signs reviewed and stable  Post vital signs: Reviewed and stable  Last Vitals:  Vitals Value Taken Time  BP    Temp    Pulse 86 07/12/2018 10:06 AM  Resp 20 07/12/2018 10:06 AM  SpO2 100 % 07/12/2018 10:06 AM  Vitals shown include unvalidated device data.  Last Pain:  Vitals:   07/12/18 0850  TempSrc:   PainSc: 0-No pain         Complications: No apparent anesthesia complications

## 2018-07-12 NOTE — Op Note (Signed)
Preoperative diagnosis: Clinical stage Ileft breast cancer Postoperative diagnosis: Same as above Procedure: 1. Left breast radioactive seed guided lumpectomy 2. Left deep axillary sentinel lymph node biopsy Surgeon: Dr. Serita Grammes Anesthesia: General with pectoral block Estimated blood loss: Minimal Specimens: 1. Left breast tissue marked with paint containing the radioactive seedand the clips 2. Left axillary sentinel lymph nodes with highest count [id number] Complications: None Drains: None Special count was correct at completion Disposition to recovery in stable condition  Indications: This isa 68 year old female who has a mammographically detected left breast cancer. We discussed all of her options and elected to proceed with a seed guided lumpectomy and axillary sentinel lymph node biopsy.  She had a radioactive seedplaced prior to coming to the operating room. I had these mammograms available in the operating room.  Procedure: After informed consent was obtained she first underwent a pectoral block. She was injected with technetium in the standard periareolar fashion. The seedwaspresent in the breast. She had SCDs in place. Cefazolin was administered. She was then placed under general anesthesia without complication. She was prepped and draped in the standard sterile surgical fashion. A surgical timeout was then performed.  Iidentified the seed in the upper inner quadrant of the left breast.I infiltrated Marcaine and made aa curvilinear incision over the seed as this was close to the skin.  I did excise some skin I used the neoprobe to identify the seed and guide me to remove the seed andthe clip. I attempted to removea rim ofnormal tissue around this as well. I then removedthis and painted it.Mammogram was taken confirming removal of the seedand the clips (the prior clip was in it as well). I also removed the additional medial margin as I felt the  seed was close. Hemostasis was observed. I placed clips in the cavity. I closed the breast tissue with 2-0 Vicryl. The skin was closed with 3-0 Vicryl and4-0 Monocryl. Glue and Steri-Strips were eventually placed over the incision.  Iidentified the sentinel node in the axilla. I made an incision below the axillary hairline. I carried this through the axillary fascia. I then excised what appeared to be several axillary sentinel lymph nodes. The highest count as listed above. There was minimal background radioactivity. Hemostasis was observed. I then closed the axillary fascia with 2-0 Vicryl the skin was closed with 3-0 Vicryl and 4-0 Monocryl. Glue and Steri-Strips were applied. She tolerated this well was extubated and transferred to recovery stable.

## 2018-07-12 NOTE — Anesthesia Procedure Notes (Signed)
Procedure Name: LMA Insertion Date/Time: 07/12/2018 9:15 AM Performed by: Genelle Bal, CRNA Pre-anesthesia Checklist: Patient identified, Emergency Drugs available, Suction available and Patient being monitored Patient Re-evaluated:Patient Re-evaluated prior to induction Oxygen Delivery Method: Circle system utilized Preoxygenation: Pre-oxygenation with 100% oxygen Induction Type: IV induction Ventilation: Mask ventilation without difficulty LMA: LMA inserted LMA Size: 4.0 Number of attempts: 1 Airway Equipment and Method: Bite block Placement Confirmation: positive ETCO2 Tube secured with: Tape Dental Injury: Teeth and Oropharynx as per pre-operative assessment

## 2018-07-12 NOTE — H&P (Signed)
61 yof referred by Dr Glori Bickers for new breast cancer. has no mass or discharge. no family history breast or ovarian cancer. no prior breast surgery. she was having follow up for benign core. has c density breasts. she was noted to have oval mass/distortion on mm with a 9x7 mm mass on [place]. her axilla is negative by [place]. she underwent core biopsy that shows grade I IDC that is er/pr pos, her2 negaive and Ki is 3%. she is retired and lives at home with her husband. otherwise healthy. she is here by herself today to discuss options  Past Surgical History Tawni Pummel, RN; 07/04/2018 7:31 AM) Gallbladder Surgery - Laparoscopic  Hysterectomy (not due to cancer) - Partial  Thyroid Surgery  Tonsillectomy   Diagnostic Studies History Tawni Pummel, RN; 07/04/2018 7:31 AM) Colonoscopy  1-5 years ago Mammogram  within last year Pap Smear  >5 years ago  Medication History Tawni Pummel, RN; 07/04/2018 7:31 AM) Medications Reconciled  Social History Tawni Pummel, RN; 07/04/2018 7:31 AM) Alcohol use  Occasional alcohol use. Caffeine use  Coffee. No drug use  Tobacco use  Former smoker.  Family History Tawni Pummel, RN; 07/04/2018 7:31 AM) First Degree Relatives  No pertinent family history   Other Problems Tawni Pummel, RN; 07/04/2018 7:31 AM) Arthritis  Cholelithiasis  Gastroesophageal Reflux Disease  High blood pressure  Hypercholesterolemia  Kidney Stone  Melanoma  Thyroid Disease   Review of Systems Sunday Spillers Ledford RN; 07/04/2018 7:31 AM) General Present- Weight Gain. Not Present- Appetite Loss, Chills, Fatigue, Fever, Night Sweats and Weight Loss. Skin Present- Change in Wart/Mole. Not Present- Dryness, Hives, Jaundice, New Lesions, Non-Healing Wounds, Rash and Ulcer. HEENT Not Present- Earache, Hearing Loss, Hoarseness, Nose Bleed, Oral Ulcers, Ringing in the Ears, Seasonal Allergies, Sinus Pain, Sore Throat, Visual Disturbances, Wears glasses/contact  lenses and Yellow Eyes. Respiratory Not Present- Bloody sputum, Chronic Cough, Difficulty Breathing, Snoring and Wheezing. Breast Not Present- Breast Mass, Breast Pain, Nipple Discharge and Skin Changes. Cardiovascular Not Present- Chest Pain, Difficulty Breathing Lying Down, Leg Cramps, Palpitations, Rapid Heart Rate, Shortness of Breath and Swelling of Extremities. Gastrointestinal Not Present- Abdominal Pain, Bloating, Bloody Stool, Change in Bowel Habits, Chronic diarrhea, Constipation, Difficulty Swallowing, Excessive gas, Gets full quickly at meals, Hemorrhoids, Indigestion, Nausea, Rectal Pain and Vomiting. Female Genitourinary Not Present- Frequency, Nocturia, Painful Urination, Pelvic Pain and Urgency. Musculoskeletal Not Present- Back Pain, Joint Pain, Joint Stiffness, Muscle Pain, Muscle Weakness and Swelling of Extremities. Neurological Not Present- Decreased Memory, Fainting, Headaches, Numbness, Seizures, Tingling, Tremor, Trouble walking and Weakness. Psychiatric Not Present- Anxiety, Bipolar, Change in Sleep Pattern, Depression, Fearful and Frequent crying. Endocrine Not Present- Cold Intolerance, Excessive Hunger, Hair Changes, Heat Intolerance, Hot flashes and New Diabetes. Hematology Not Present- Blood Thinners, Easy Bruising, Excessive bleeding, Gland problems, HIV and Persistent Infections.   Physical Exam Rolm Bookbinder MD; 07/04/2018 2:30 PM) General Mental Status-Alert. Head and Neck Trachea-midline. Thyroid Gland Characteristics - normal size and consistency. Eye Sclera/Conjunctiva - Bilateral-No scleral icterus. Chest and Lung Exam Chest and lung exam reveals -quiet, even and easy respiratory effort with no use of accessory muscles and on auscultation, normal breath sounds, no adventitious sounds and normal vocal resonance. Breast Nipples-No Discharge. Breast Lump-No Palpable Breast Mass. Cardiovascular Cardiovascular examination reveals -normal  heart sounds, regular rate and rhythm with no murmurs. Abdomen Note: soft no hepatomegaly Neurologic Neurologic evaluation reveals -alert and oriented x 3 with no impairment of recent or remote memory. Lymphatic Head & Neck General Head & Neck Lymphatics:  Bilateral - Description - Normal. Axillary General Axillary Region: Bilateral - Description - Normal. Note: no [place] adenopathy   Assessment & Plan Rolm Bookbinder MD; 07/04/2018 2:34 PM) BREAST CANCER OF UPPER-INNER QUADRANT OF LEFT FEMALE BREAST (C50.212) Story: Left breast seed guided lumpectomy, left axillary sn biopsy We discussed the staging and pathophysiology of breast cancer. We discussed all of the different options for treatment for breast cancer including surgery, chemotherapy, radiation therapy, Herceptin, and antiestrogen therapy. We discussed a sentinel lymph node biopsy as she does not appear to having lymph node involvement right now. We discussed the performance of that with injection of radioactive tracer. We discussed that there is a chance of having a positive node with a sentinel lymph node biopsy and we will await the permanent pathology to make any other first further decisions in terms of her treatment. One of these options might be to return to the operating room to perform an axillary lymph node dissection. We discussed up to a 5% risk lifetime of chronic shoulder pain as well as lymphedema associated with a sentinel lymph node biopsy. We discussed the options for treatment of the breast cancer which included lumpectomy versus a mastectomy. We discussed the performance of the lumpectomy with radioactive seed placement. We discussed a 5-10% chance of a positive margin requiring reexcision in the operating room. We also discussed that she will need radiation therapy if she undergoes lumpectomy. We discussed mastectomy and the postoperative care for that as well. Mastectomy can be followed by reconstruction. The decision  for lumpectomy vs mastectomy has no impact on decision for chemotherapy. We discussed that there is no difference in her survival whether she undergoes lumpectomy with radiation therapy or antiestrogen therapy versus a mastectomy. There is also no real difference between her recurrence in the breast. We discussed the risks of operation including bleeding, infection, possible reoperation. She understands her further treatment based on surgery. will schedule asap per request

## 2018-07-12 NOTE — Anesthesia Procedure Notes (Signed)
Anesthesia Regional Block: Pectoralis block   Pre-Anesthetic Checklist: ,, timeout performed, Correct Patient, Correct Site, Correct Laterality, Correct Procedure, Correct Position, site marked, Risks and benefits discussed,  Surgical consent,  Pre-op evaluation,  At surgeon's request and post-op pain management  Laterality: Left  Prep: chloraprep       Needles:  Injection technique: Single-shot  Needle Type: Echogenic Stimulator Needle     Needle Length: 9cm  Needle Gauge: 21     Additional Needles:   Procedures:,,,, ultrasound used (permanent image in chart),,,,  Narrative:  Start time: 07/12/2018 8:35 AM End time: 07/12/2018 8:42 AM Injection made incrementally with aspirations every 5 mL.  Performed by: Personally  Anesthesiologist: Lidia Collum, MD  Additional Notes: Monitors applied. Injection made in 5cc increments. No resistance to injection. Good needle visualization. Patient tolerated procedure well.

## 2018-07-13 ENCOUNTER — Telehealth: Payer: Self-pay | Admitting: *Deleted

## 2018-07-13 ENCOUNTER — Encounter: Payer: Self-pay | Admitting: *Deleted

## 2018-07-13 ENCOUNTER — Encounter (HOSPITAL_COMMUNITY): Payer: Self-pay | Admitting: General Surgery

## 2018-07-13 NOTE — Telephone Encounter (Signed)
Spoke to pt concerning [place] from 1.29.20. Denies questions or concerns regarding dx or treatment care plan. Relate not in any pain from sx on 2/6. Confirmed further appts. Encourage pt to call with needs. Received verbal understanding.

## 2018-07-18 ENCOUNTER — Other Ambulatory Visit: Payer: Self-pay | Admitting: Oncology

## 2018-07-24 NOTE — Progress Notes (Signed)
Location of Breast Cancer:  Left  Histology per Pathology Report: 07/12/2018  Diagnosis 1. Breast, lumpectomy, Left w/seed - INVASIVE DUCTAL CARCINOMA, 0.8 CM, GRADE I. - DUCTAL CARCINOMA IN SITU, INTERMEDIATE NUCLEAR GRADE. - CARCINOMA IS FOCALLY LESS THAN 1 MM FROM THE ANTERIOR MARGIN AND 7 MM FROM SUPERIOR AND POSTERIOR MARGINS. - NEGATIVE FOR LYMPHOVASCULAR OR PERINEURAL INVASION. - BIOPSY SITE CHANGES. - SEE ONCOLOGY TABLE. 2. Lymph node, sentinel, biopsy, Left Axillary - LYMPH NODE, NEGATIVE FOR CARCINOMA (0/1). 3. Breast, excision, Left Medial Margin - BENIGN BREAST PARENCHYMA WITH NO SPECIFIC HISTOPATHOLOGIC CHANGES. - NEGATIVE FOR CARCINOMA  Receptor Status: ER(), PR (), Her2-neu (), Ki-() Estrogen Receptor: 100%, strong staining intensity. Progesterone Receptor: 20%, strong staining intensity. HER2: Negative (1+). ki-67: 3%.  Did patient present with symptoms (if so, please note symptoms) or was this found on screening mammography?:   Past/Anticipated interventions by surgeon, if any: Rolm Bookbinder- 07/12/2018 LEFT BREAST LUMPECTOMY WITH RADIOACTIVE SEED AND LEFT AXILLARY SENTINEL LYMPH NODE BIOPSY  Past/Anticipated interventions by medical oncology, if any: Chemotherapy:  No chemotherapy  Lymphedema issues, if any: Patient denies  Pain issues, if any:  Patient denies  SAFETY ISSUES:  Prior radiation? No  Pacemaker/ICD? No  Possible current pregnancy? No  Is the patient on methotrexate? No  Current Complaints / other details:  Patient states that she seems to be more anxious.    Rico Sheehan, LPN 6/97/9480,1:65 PM

## 2018-07-26 ENCOUNTER — Other Ambulatory Visit: Payer: Self-pay

## 2018-07-26 ENCOUNTER — Telehealth: Payer: Self-pay | Admitting: Family Medicine

## 2018-07-26 ENCOUNTER — Ambulatory Visit
Admission: RE | Admit: 2018-07-26 | Discharge: 2018-07-26 | Disposition: A | Discharge status: Home / Self Care | Payer: Medicare Other | Source: Ambulatory Visit | Attending: Radiation Oncology | Admitting: Radiation Oncology

## 2018-07-26 ENCOUNTER — Encounter: Payer: Self-pay | Admitting: Radiation Oncology

## 2018-07-26 VITALS — BP 121/63 | HR 64 | Temp 98.3°F | Resp 20 | Ht 64.0 in | Wt 214.6 lb

## 2018-07-26 DIAGNOSIS — C50512 Malignant neoplasm of lower-outer quadrant of left female breast: Secondary | ICD-10-CM | POA: Insufficient documentation

## 2018-07-26 DIAGNOSIS — R7402 Elevation of levels of lactic acid dehydrogenase (LDH): Secondary | ICD-10-CM

## 2018-07-26 DIAGNOSIS — Z510 Encounter for antineoplastic radiation therapy: Secondary | ICD-10-CM | POA: Insufficient documentation

## 2018-07-26 DIAGNOSIS — C50212 Malignant neoplasm of upper-inner quadrant of left female breast: Secondary | ICD-10-CM | POA: Diagnosis not present

## 2018-07-26 DIAGNOSIS — E039 Hypothyroidism, unspecified: Secondary | ICD-10-CM

## 2018-07-26 DIAGNOSIS — R7309 Other abnormal glucose: Secondary | ICD-10-CM

## 2018-07-26 DIAGNOSIS — Z9889 Other specified postprocedural states: Secondary | ICD-10-CM | POA: Diagnosis not present

## 2018-07-26 DIAGNOSIS — Z808 Family history of malignant neoplasm of other organs or systems: Secondary | ICD-10-CM | POA: Diagnosis not present

## 2018-07-26 DIAGNOSIS — Z170 Estrogen receptor positive status [ER+]: Secondary | ICD-10-CM

## 2018-07-26 DIAGNOSIS — I10 Essential (primary) hypertension: Secondary | ICD-10-CM

## 2018-07-26 DIAGNOSIS — D696 Thrombocytopenia, unspecified: Secondary | ICD-10-CM

## 2018-07-26 DIAGNOSIS — R740 Nonspecific elevation of levels of transaminase and lactic acid dehydrogenase [LDH]: Secondary | ICD-10-CM

## 2018-07-26 DIAGNOSIS — E7800 Pure hypercholesterolemia, unspecified: Secondary | ICD-10-CM

## 2018-07-26 DIAGNOSIS — R7401 Elevation of levels of liver transaminase levels: Secondary | ICD-10-CM

## 2018-07-26 HISTORY — DX: Fatty (change of) liver, not elsewhere classified: K76.0

## 2018-07-26 NOTE — Progress Notes (Signed)
  Radiation Oncology         [phone] ________________________________  Name: Selena Johnson MRN: [id number]  Date: 07/26/2018  DOB: 1950-11-01  Optical Surface Tracking Plan:  Since intensity modulated radiotherapy (IMRT) and 3D conformal radiation treatment methods are predicated on accurate and precise positioning for treatment, intrafraction motion monitoring is medically necessary to ensure accurate and safe treatment delivery.  The ability to quantify intrafraction motion without excessive ionizing radiation dose can only be performed with optical surface tracking. Accordingly, surface imaging offers the opportunity to obtain 3D measurements of patient position throughout IMRT and 3D treatments without excessive radiation exposure.  I am ordering optical surface tracking for this patient's upcoming course of radiotherapy. ________________________________  Kyung Rudd, MD 07/26/2018 3:55 PM    Reference:   Particia Jasper, et al. Surface imaging-based analysis of intrafraction motion for breast radiotherapy patients.Journal of [place], n. 6, nov. 2014. ISSN [id number].   Available at: <http://www.jacmp.org/index.php/jacmp/article/view/4957>.

## 2018-07-26 NOTE — Progress Notes (Signed)
  Radiation Oncology         [phone] ________________________________  Name: Selena Johnson MRN: [id number]  Date: 07/26/2018  DOB: 08/10/1950   DIAGNOSIS:     ICD-10-CM   1. Malignant neoplasm of upper-inner quadrant of left breast in female, estrogen receptor positive ([place]) C50.212    Z17.0     SIMULATION AND TREATMENT PLANNING NOTE  The patient presented for simulation prior to beginning her course of radiation treatment for her diagnosis of left-sided breast cancer. The patient was placed in a supine position on a breast board. A customized vac-lock bag was constructed and this complex treatment device will be used on a daily basis during her treatment. In this fashion, a CT scan was obtained through the chest area and an isocenter was placed near the chest wall within the breast.  The patient will be planned to receive a course of radiation initially to a dose of 42.56 Gy. This will consist of a whole breast radiotherapy technique. To accomplish this, 2 customized blocks have been designed which will correspond to medial and lateral whole breast tangent fields. This treatment will be accomplished at 2.66 Gy per fraction. A forward planning technique will also be evaluated to determine if this approach improves the plan. It is anticipated that the patient will then receive a 8 Gy boost to the seroma cavity which has been contoured. This will be accomplished at 2 Gy per fraction.   This initial treatment will consist of a 3-D conformal technique. The seroma has been contoured as the primary target structure. Additionally, dose volume histograms of both this target as well as the lungs and heart will also be evaluated. Such an approach is necessary to ensure that the target area is adequately covered while the nearby critical  normal structures are adequately spared.  Plan:  The final anticipated total dose therefore will correspond to 50.56 Gy.    Special treatment procedure was  performed today due to the extra time and effort required by myself to plan and prepare this patient for deep inspiration breath hold technique.  I have determined cardiac sparing to be of benefit to this patient to prevent long term cardiac damage due to radiation of the heart.  Bellows were placed on the patient's abdomen. To facilitate cardiac sparing, the patient was coached by the radiation therapists on breath hold techniques and breathing practice was performed. Practice waveforms were obtained. The patient was then scanned while maintaining breath hold in the treatment position.  This image was then transferred over to the imaging specialist. The imaging specialist then created a fusion of the free breathing and breath hold scans using the chest wall as the stable structure. I personally reviewed the fusion in axial, coronal and sagittal image planes.  Excellent cardiac sparing was obtained.  I felt the patient is an appropriate candidate for breath hold and the patient will be treated as such.  The image fusion was then reviewed with the patient to reinforce the necessity of reproducible breath hold.     _______________________________   Jodelle Gross, MD, PhD

## 2018-07-26 NOTE — Progress Notes (Signed)
Radiation Oncology         [phone] ________________________________  Name: Selena Johnson        MRN: [id number]  Date of Service: 07/26/2018 DOB: Apr 18, 1951  CC:Tower, [name], MD  Magrinat, Virgie Dad, MD     REFERRING PHYSICIAN: Magrinat, Virgie Dad, MD   DIAGNOSIS: The encounter diagnosis was Malignant neoplasm of upper-inner quadrant of left breast in female, estrogen receptor positive ([place]).   HISTORY OF PRESENT ILLNESS: Selena Johnson is a 68 y.o. female originally seen in the multidisciplinary breast clinic for a new diagnosis of left breast cancer. The patient was noted to have a screening detected mass in the left breast. It was measured as 9 mm on diagnostic ultrasound and her axilla was negative for adenopathy. She underwent a biopsy on 06/20/2018 revealed a grade 1, ER/PR positive invasive ductal carcinoma. Her tumor was HER2 negative, and her Ki 67 was 20%. She underwent lumpectomy and sentinel node biopsy on 07/12/2018. Final pathology revealed a grade 1, 8 mm invasive ductal carcinoma. Her tumor was <1 mm to the anterior margin, and remaining margins were clear. She had one node that was sampled and was negative. Dr. Ron Agee does not anticipate a need for oncotype dx testing per his last noted.    PREVIOUS RADIATION THERAPY: No   PAST MEDICAL HISTORY:  Past Medical History:  Diagnosis Date  . Allergy   . Cancer ([place])    basal cell skin CA  . Chronic kidney disease    hx of kidney stones  . Conjunctivitis    chronic  . Fatty liver 2015  . GERD (gastroesophageal reflux disease)   . Glaucoma   . Hearing loss in left ear   . History of kidney stones   . Hyperlipidemia    no per pt  . Hypertension   . Menopausal syndrome   . NASH (nonalcoholic steatohepatitis)    with cirrhosis and mild esoph varicies  . Primary hypothyroidism        PAST SURGICAL HISTORY: Past Surgical History:  Procedure Laterality Date  . ABDOMINAL HYSTERECTOMY     partial ? prolapse    . BREAST LUMPECTOMY WITH RADIOACTIVE SEED AND SENTINEL LYMPH NODE BIOPSY Left 07/12/2018   Procedure: LEFT BREAST LUMPECTOMY WITH RADIOACTIVE SEED AND LEFT AXILLARY SENTINEL LYMPH NODE BIOPSY;  Surgeon: Rolm Bookbinder, MD;  Location: [facility];  Service: General;  Laterality: Left;  . CHOLECYSTECTOMY    . ESOPHAGOGASTRODUODENOSCOPY    . TONSILLECTOMY       FAMILY HISTORY:  Family History  Problem Relation Age of Onset  . Heart disease Mother        CAD  . Hypertension Mother   . Glaucoma Mother   . Heart disease Father        CAD  . Diabetes Father   . Hypertension Father   . Cancer Father        ? CA - squamous cell carcinoma  . Colon cancer Neg Hx   . Esophageal cancer Neg Hx   . Rectal cancer Neg Hx   . Stomach cancer Neg Hx      SOCIAL HISTORY:  reports that she quit smoking about 36 years ago. She has never used smokeless tobacco. She reports current alcohol use. She reports that she does not use drugs. The patient is married and lives in [place]. She is retired.    ALLERGIES: Codeine and Ivp dye [iodinated diagnostic agents]   MEDICATIONS:  Current Outpatient Medications  Medication Sig Dispense Refill  . Biotin 2500 MCG CAPS Take 1 capsule by mouth daily. 5,000 mcg    . Cholecalciferol (VITAMIN D3) 2000 units TABS Take 1 tablet by mouth daily.    Marland Kitchen levothyroxine (SYNTHROID, LEVOTHROID) 175 MCG tablet Take 1 tablet (175 mcg total) by mouth daily before breakfast. 90 tablet 1  . loratadine (CLARITIN) 10 MG tablet Take 10 mg by mouth daily as needed.      . metoprolol succinate (TOPROL-XL) 100 MG 24 hr tablet Take 1 tablet by mouth every day with a meal 90 tablet 1  . Multiple Vitamin (MULTIVITAMIN) capsule Take 1 capsule by mouth daily.      . potassium chloride SA (K-DUR,KLOR-CON) 20 MEQ tablet Take 1 tablet by mouth every day 90 tablet 1  . triamterene-hydrochlorothiazide (MAXZIDE-25) 37.5-25 MG tablet Take 1 tablet by mouth every day 90 tablet 1  . oxyCODONE  (OXY IR/ROXICODONE) 5 MG immediate release tablet Take 1 tablet (5 mg total) by mouth every 6 (six) hours as needed for moderate pain, severe pain or breakthrough pain. (Patient not taking: Reported on 07/26/2018) 10 tablet 0   Current Facility-Administered Medications  Medication Dose Route Frequency Provider Last Rate Last Dose  . 0.9 %  sodium chloride infusion  500 mL Intravenous Continuous Nandigam, Venia Minks, MD         REVIEW OF SYSTEMS: On review of systems, the patient reports that she is doing well overall. She thinks she is healing well at this time. She denies any chest pain, shortness of breath, cough, fevers, chills, night sweats, unintended weight changes. She denies any bowel or bladder disturbances, and denies abdominal pain, nausea or vomiting. She denies any new musculoskeletal or joint aches or pains. A complete review of systems is obtained and is otherwise negative.     PHYSICAL EXAM:  Wt Readings from Last 3 Encounters:  07/26/18 214 lb 9.6 oz (97.3 kg)  07/12/18 211 lb (95.7 kg)  07/09/18 212 lb 4.8 oz (96.3 kg)   Temp Readings from Last 3 Encounters:  07/26/18 98.3 F (36.8 C) (Oral)  07/12/18 (!) 97.5 F (36.4 C)  07/09/18 98.5 F (36.9 C) (Oral)   BP Readings from Last 3 Encounters:  07/26/18 121/63  07/12/18 130/71  07/09/18 (!) 155/70   Pulse Readings from Last 3 Encounters:  07/26/18 64  07/12/18 76  07/09/18 63     In general this is a well appearing caucasian female in no acute distress. She is alert and oriented x4 and appropriate throughout the examination. HEENT reveals that the patient is normocephalic, atraumatic. EOMs are intact. Skin is intact without any evidence of gross lesions. Cardiopulmonary assessment is negative for acute distress and she exhibits normal effort. Her left breast incision site is well healed as is her axillary incision site. No erythema is noted.     ECOG = 0  0 - Asymptomatic (Fully active, able to carry on all  predisease activities without restriction)  1 - Symptomatic but completely ambulatory (Restricted in physically strenuous activity but ambulatory and able to carry out work of a light or sedentary nature. For example, light housework, office work)  2 - Symptomatic, <50% in bed during the day (Ambulatory and capable of all self care but unable to carry out any work activities. Up and about more than 50% of waking hours)  3 - Symptomatic, >50% in bed, but not bedbound (Capable of only limited self-care, confined to bed or chair 50% or more of  waking hours)  4 - Bedbound (Completely disabled. Cannot carry on any self-care. Totally confined to bed or chair)  5 - Death   Eustace Pen MM, Creech RH, Tormey DC, et al. [id number]). "Toxicity and response criteria of the [facility]". [place] Oncol. 5 (6): 649-55    LABORATORY DATA:  Lab Results  Component Value Date   WBC 4.8 07/09/2018   HGB 14.4 07/09/2018   HCT 42.9 07/09/2018   MCV 100.7 (H) 07/09/2018   PLT 70 (L) 07/09/2018   Lab Results  Component Value Date   NA 142 07/09/2018   K 3.5 07/09/2018   CL 107 07/09/2018   CO2 24 07/09/2018   Lab Results  Component Value Date   ALT 36 07/04/2018   AST 53 (H) 07/04/2018   ALKPHOS 111 07/04/2018   BILITOT 2.2 (H) 07/04/2018      RADIOGRAPHY: Nm Sentinel Node Inj-no Rpt (breast)  Result Date: 07/12/2018 Sulfur colloid was injected by the nuclear medicine technologist for melanoma sentinel node.       IMPRESSION/PLAN: 1. Stage IA, pT1bN0M0, Grade 1, ER/PR positive, invasive ductal carcinoma of the left breast. Dr. Lisbeth Renshaw discusses the results of final pathology findings and reviews the nature of invasive breast disease. She is doing well from surgery and is ready to proceed with the planning of external radiotherapy to the breast followed by antiestrogen therapy. We reviewed the rationale for adjuvant therapy overall and she is interested in proceeding. We discussed  the risks, benefits, short, and long term effects of radiotherapy.  Dr. Lisbeth Renshaw discusses the delivery and logistics of radiotherapy and recommends a course of 4 weeks of radiotherapy. Written consent is obtained and placed in the chart, a copy was provided to the patient. She will simulate today as well.    In a visit lasting 25 minutes, greater than 50% of the time was spent face to face discussing her case, and coordinating the patient's care.  The above documentation reflects my direct findings during this shared patient visit. Please see the separate note by Dr. Lisbeth Renshaw on this date for the remainder of the patient's plan of care.    Carola Rhine, PAC

## 2018-07-26 NOTE — Telephone Encounter (Signed)
-----   Message from Eustace Pen, LPN sent at 09/19/9731  4:08 PM EST ----- Regarding: Labs 2/20 Lab orders needed. Thank you.

## 2018-07-27 ENCOUNTER — Other Ambulatory Visit (INDEPENDENT_AMBULATORY_CARE_PROVIDER_SITE_OTHER): Payer: Medicare Other

## 2018-07-27 ENCOUNTER — Ambulatory Visit: Payer: Medicare Other

## 2018-07-27 DIAGNOSIS — E7800 Pure hypercholesterolemia, unspecified: Secondary | ICD-10-CM

## 2018-07-27 DIAGNOSIS — E039 Hypothyroidism, unspecified: Secondary | ICD-10-CM

## 2018-07-27 DIAGNOSIS — I10 Essential (primary) hypertension: Secondary | ICD-10-CM | POA: Diagnosis not present

## 2018-07-27 DIAGNOSIS — D696 Thrombocytopenia, unspecified: Secondary | ICD-10-CM | POA: Diagnosis not present

## 2018-07-27 DIAGNOSIS — R7309 Other abnormal glucose: Secondary | ICD-10-CM | POA: Diagnosis not present

## 2018-07-27 LAB — CBC WITH DIFFERENTIAL/PLATELET
Basophils Absolute: 0 10*3/uL (ref 0.0–0.1)
Basophils Relative: 0.5 % (ref 0.0–3.0)
Eosinophils Absolute: 0.2 10*3/uL (ref 0.0–0.7)
Eosinophils Relative: 4.1 % (ref 0.0–5.0)
HCT: 42.9 % (ref 36.0–46.0)
Hemoglobin: 14.9 g/dL (ref 12.0–15.0)
Lymphocytes Relative: 21.5 % (ref 12.0–46.0)
Lymphs Abs: 1.1 10*3/uL (ref 0.7–4.0)
MCHC: 34.6 g/dL (ref 30.0–36.0)
MCV: 101.6 fl — AB (ref 78.0–100.0)
MONOS PCT: 6.7 % (ref 3.0–12.0)
Monocytes Absolute: 0.3 10*3/uL (ref 0.1–1.0)
Neutro Abs: 3.4 10*3/uL (ref 1.4–7.7)
Neutrophils Relative %: 67.2 % (ref 43.0–77.0)
Platelets: 69 10*3/uL — ABNORMAL LOW (ref 150.0–400.0)
RBC: 4.22 Mil/uL (ref 3.87–5.11)
RDW: 13.2 % (ref 11.5–15.5)
WBC: 5 10*3/uL (ref 4.0–10.5)

## 2018-07-27 LAB — LIPID PANEL
Cholesterol: 214 mg/dL — ABNORMAL HIGH (ref 0–200)
HDL: 67.1 mg/dL (ref >39.00)
LDL Cholesterol: 133 mg/dL — ABNORMAL HIGH (ref 0–99)
NonHDL: 146.46
Total CHOL/HDL Ratio: 3
Triglycerides: 69 mg/dL (ref 0.0–149.0)
VLDL: 13.8 mg/dL (ref 0.0–40.0)

## 2018-07-27 LAB — COMPREHENSIVE METABOLIC PANEL
ALK PHOS: 113 U/L (ref 39–117)
ALT: 30 U/L (ref 0–35)
AST: 44 U/L — ABNORMAL HIGH (ref 0–37)
Albumin: 3.9 g/dL (ref 3.5–5.2)
BILIRUBIN TOTAL: 1.6 mg/dL — AB (ref 0.2–1.2)
BUN: 8 mg/dL (ref 6–23)
CO2: 29 mEq/L (ref 19–32)
Calcium: 9.4 mg/dL (ref 8.4–10.5)
Chloride: 105 mEq/L (ref 96–112)
Creatinine, Ser: 0.61 mg/dL (ref 0.40–1.20)
GFR: 97.02 mL/min (ref >60.00)
Glucose, Bld: 95 mg/dL (ref 70–99)
Potassium: 3.7 mEq/L (ref 3.5–5.1)
Sodium: 142 mEq/L (ref 135–145)
Total Protein: 6.4 g/dL (ref 6.0–8.3)

## 2018-07-27 LAB — TSH: TSH: 0.41 u[IU]/mL (ref 0.35–4.50)

## 2018-07-27 LAB — HEMOGLOBIN A1C: Hgb A1c MFr Bld: 4.5 % — ABNORMAL LOW (ref 4.6–6.5)

## 2018-07-29 ENCOUNTER — Other Ambulatory Visit: Payer: Self-pay | Admitting: Oncology

## 2018-07-31 ENCOUNTER — Ambulatory Visit: Payer: Medicare Other

## 2018-07-31 DIAGNOSIS — Z510 Encounter for antineoplastic radiation therapy: Secondary | ICD-10-CM | POA: Diagnosis not present

## 2018-07-31 DIAGNOSIS — C50512 Malignant neoplasm of lower-outer quadrant of left female breast: Secondary | ICD-10-CM | POA: Diagnosis not present

## 2018-07-31 DIAGNOSIS — C50212 Malignant neoplasm of upper-inner quadrant of left female breast: Secondary | ICD-10-CM | POA: Diagnosis not present

## 2018-08-01 ENCOUNTER — Encounter: Payer: Self-pay | Admitting: General Practice

## 2018-08-01 NOTE — Progress Notes (Signed)
[facility] Psychosocial Distress Screening Clinical Social Work  Clinical Social Work was referred by distress screening protocol.  The patient scored a 5 on the Psychosocial Distress Thermometer which indicates moderate distress. Clinical Social Worker contacted patient by phone to assess for distress and other psychosocial needs. Patient doing well, no needs at this time.  "Its starting to calm down now", more at ease w treatment plan and process.  [place] services and encouraged her to reach out as needed for support/resources.    ONCBCN DISTRESS SCREENING 07/26/2018  Screening Type Initial Screening  Distress experienced in past week (1-10) 5  Emotional problem type Nervousness/Anxiety;Adjusting to illness  Referral to clinical social work Yes    Clinical Social Worker follow up needed: No.  Await patient return call.    If yes, follow up plan:  Beverely Pace, [place], LCSW Clinical Social Worker Phone:  [phone]

## 2018-08-03 ENCOUNTER — Ambulatory Visit (INDEPENDENT_AMBULATORY_CARE_PROVIDER_SITE_OTHER): Payer: Medicare Other | Admitting: Family Medicine

## 2018-08-03 ENCOUNTER — Encounter: Payer: Self-pay | Admitting: Family Medicine

## 2018-08-03 VITALS — BP 110/66 | HR 65 | Temp 98.2°F | Ht 65.0 in | Wt 213.4 lb

## 2018-08-03 DIAGNOSIS — K7460 Unspecified cirrhosis of liver: Secondary | ICD-10-CM

## 2018-08-03 DIAGNOSIS — D696 Thrombocytopenia, unspecified: Secondary | ICD-10-CM

## 2018-08-03 DIAGNOSIS — Z6835 Body mass index (BMI) 35.0-35.9, adult: Secondary | ICD-10-CM

## 2018-08-03 DIAGNOSIS — Z170 Estrogen receptor positive status [ER+]: Secondary | ICD-10-CM | POA: Diagnosis not present

## 2018-08-03 DIAGNOSIS — C50212 Malignant neoplasm of upper-inner quadrant of left female breast: Secondary | ICD-10-CM | POA: Diagnosis not present

## 2018-08-03 DIAGNOSIS — E039 Hypothyroidism, unspecified: Secondary | ICD-10-CM

## 2018-08-03 DIAGNOSIS — Z0000 Encounter for general adult medical examination without abnormal findings: Secondary | ICD-10-CM | POA: Diagnosis not present

## 2018-08-03 DIAGNOSIS — R7309 Other abnormal glucose: Secondary | ICD-10-CM

## 2018-08-03 DIAGNOSIS — E7800 Pure hypercholesterolemia, unspecified: Secondary | ICD-10-CM

## 2018-08-03 MED ORDER — TRIAMTERENE-HCTZ 37.5-25 MG PO TABS: 1.0000 | ORAL_TABLET | Freq: Every day | ORAL | 3 refills | Status: DC

## 2018-08-03 MED ORDER — LEVOTHYROXINE SODIUM 175 MCG PO TABS: 175.0000 ug | ORAL_TABLET | Freq: Every day | ORAL | 3 refills | Status: DC

## 2018-08-03 MED ORDER — METOPROLOL SUCCINATE ER 100 MG PO TB24: ORAL_TABLET | ORAL | 3 refills | Status: DC

## 2018-08-03 MED ORDER — POTASSIUM CHLORIDE CRYS ER 20 MEQ PO TBCR: EXTENDED_RELEASE_TABLET | ORAL | 3 refills | Status: DC

## 2018-08-03 NOTE — Patient Instructions (Addendum)
When things slow down -stop eating on the run   Start back exercise when you can   Labs look stable   Take care of yourself  Good luck with the rest of your breast cancer treatment   A bone density test will be a good idea once you finish radiation (you can talk to oncology about it)

## 2018-08-03 NOTE — Progress Notes (Signed)
Subjective:    Patient ID: Selena Johnson, female    DOB: 04-20-1951, 68 y.o.   MRN: [id number]  HPI  Here for amw and also rev of chronic medical problems  I have personally reviewed the Medicare Annual Wellness questionnaire and have noted 1. The patient's medical and social history 2. Their use of alcohol, tobacco or illicit drugs 3. Their current medications and supplements 4. The patient's functional ability including ADL's, fall risks, home safety risks and hearing or visual             impairment. 5. Diet and physical activities 6. Evidence for depression or mood disorders  The patients weight, height, BMI have been recorded in the chart and visual acuity is per eye clinic.  I have made referrals, counseling and provided education to the patient based review of the above and I have provided the pt with a written personalized care plan for preventive services. Reviewed and updated provider list, see scanned forms.  See scanned forms.  Routine anticipatory guidance given to patient.  See health maintenance. Colon cancer screening-11/17 Breast cancer screening 1/20 mammogram pos and diagnosed with cancer in L breast est rec pos  Starts radiation on 3/9 Self breast exam-healing from surgery/it looks pretty good  Flu vaccine 10/19 Tetanus vaccine 2/19 Pneumovax completed both Zoster vaccine-had shingrix  dexa 2/17-normal bone density She takes vit D 3 2000 iu daily  Advance directive-she has living will and POA and husband is power of attourney Cognitive function addressed- see scanned forms- and if abnormal then additional documentation follows. -no worries  Can occ walk in a room and forget why   PMH and SH reviewed  Meds, vitals, and allergies reviewed.    Hearing Screening   125Hz  250Hz  500Hz  1000Hz  2000Hz  3000Hz  4000Hz  6000Hz  8000Hz   Right ear:   40 40 0  40    Left ear:   0 0 40  0    Vision Screening Comments: Pt had eye exam with Dr. Edison Pace at [facility] in  July 2019  Had hearing test at [facility] audiology - did not recommend aide yet   ROS: See HPI.  Otherwise negative.    Wt Readings from Last 3 Encounters:  08/03/18 213 lb 6 oz (96.8 kg)  07/26/18 214 lb 9.6 oz (97.3 kg)  07/12/18 211 lb (95.7 kg)  fairly stable  35.51 kg/m  Brother has bladder cancer  Was going to hospital and not eating optimally   bp is stable today  No cp or palpitations or headaches or edema  No side effects to medicines  BP Readings from Last 3 Encounters:  08/03/18 110/66  07/26/18 121/63  07/12/18 130/71   35.51 kg/m   Sees GI for NASH Lab Results  Component Value Date   ALT 30 07/27/2018   AST 44 (H) 07/27/2018   ALKPHOS 113 07/27/2018   BILITOT 1.6 (H) 07/27/2018   Lab Results  Component Value Date   CREATININE 0.61 07/27/2018   BUN 8 07/27/2018   NA 142 07/27/2018   K 3.7 07/27/2018   CL 105 07/27/2018   CO2 29 07/27/2018    Hypothyroidism  Pt has no clinical changes No change in energy level/ hair or skin/ edema and no tremor Lab Results  Component Value Date   TSH 0.41 07/27/2018      H/o thrombocytopenia Lab Results  Component Value Date   WBC 5.0 07/27/2018   HGB 14.9 07/27/2018   HCT 42.9 07/27/2018  MCV 101.6 (H) 07/27/2018   PLT 69.0 (L) 07/27/2018   count was 67 last time  No extra bleeding or bruising  Also sees Dr Jana Hakim  Hyperlipidemia Lab Results  Component Value Date   CHOL 214 (H) 07/27/2018   CHOL 212 (H) 07/24/2017   CHOL 195 07/21/2016   Lab Results  Component Value Date   HDL 67.10 07/27/2018   HDL 59.80 07/24/2017   HDL 48.20 07/21/2016   Lab Results  Component Value Date   LDLCALC 133 (H) 07/27/2018   LDLCALC 137 (H) 07/24/2017   LDLCALC 125 (H) 07/21/2016   Lab Results  Component Value Date   TRIG 69.0 07/27/2018   TRIG 75.0 07/24/2017   TRIG 107.0 07/21/2016   Lab Results  Component Value Date   CHOLHDL 3 07/27/2018   CHOLHDL 4 07/24/2017   CHOLHDL 4 07/21/2016   Lab Results    Component Value Date   LDLDIRECT 150.1 07/08/2013   LDLDIRECT 144.9 01/02/2013   LDLDIRECT 139.8 07/06/2012  HDL is up - and ratio improved    Blood glucose Lab Results  Component Value Date   HGBA1C 4.5 (L) 07/27/2018  very good  Glucose was 45   Patient Active Problem List   Diagnosis Date Noted  . Malignant neoplasm of upper-inner quadrant of left breast in female, estrogen receptor positive ([place]) 06/27/2018  . Left breast mass 06/17/2018  . Elevated random blood glucose level 07/28/2017  . Skin cancer screening 05/05/2017  . Colon cancer screening 07/21/2015  . Estrogen deficiency 07/21/2015  . Calcification of right breast 05/06/2015  . Encounter for Medicare annual wellness exam 07/20/2014  . Unspecified constipation 10/25/2012  . Hepatic cirrhosis ([place]) 10/23/2012  . Hypokalemia 07/13/2012  . Thrombocytopenia ([place]) 11/10/2010  . Routine general medical examination at a health care facility 11/04/2010  . Hyperlipidemia 03/18/2008  . Obesity 03/18/2008  . TRANSAMINASES, SERUM, ELEVATED 03/18/2008  . MENOPAUSE-RELATED VASOMOTOR SYMPTOMS, HOT FLASHES 10/23/2006  . Hypothyroidism 10/11/2006  . GLAUCOMA 10/11/2006  . Essential hypertension 10/11/2006  . GERD 10/11/2006  . History of kidney stones 10/11/2006   Past Medical History:  Diagnosis Date  . Allergy   . Cancer ([place])    basal cell skin CA  . Chronic kidney disease    hx of kidney stones  . Conjunctivitis    chronic  . Fatty liver 2015  . GERD (gastroesophageal reflux disease)   . Glaucoma   . Hearing loss in left ear   . History of kidney stones   . Hyperlipidemia    no per pt  . Hypertension   . Menopausal syndrome   . NASH (nonalcoholic steatohepatitis)    with cirrhosis and mild esoph varicies  . Primary hypothyroidism    Past Surgical History:  Procedure Laterality Date  . ABDOMINAL HYSTERECTOMY     partial ? prolapse  . BREAST LUMPECTOMY WITH RADIOACTIVE SEED AND SENTINEL LYMPH NODE  BIOPSY Left 07/12/2018   Procedure: LEFT BREAST LUMPECTOMY WITH RADIOACTIVE SEED AND LEFT AXILLARY SENTINEL LYMPH NODE BIOPSY;  Surgeon: Rolm Bookbinder, MD;  Location: [facility];  Service: General;  Laterality: Left;  . CHOLECYSTECTOMY    . ESOPHAGOGASTRODUODENOSCOPY    . TONSILLECTOMY     Social History   Tobacco Use  . Smoking status: Former Smoker    Last attempt to quit: 06/06/1982    Years since quitting: 36.1  . Smokeless tobacco: Never Used  Substance Use Topics  . Alcohol use: Yes    Alcohol/week: 0.0 standard drinks  Comment: wine- rare  . Drug use: No   Family History  Problem Relation Age of Onset  . Heart disease Mother        CAD  . Hypertension Mother   . Glaucoma Mother   . Heart disease Father        CAD  . Diabetes Father   . Hypertension Father   . Cancer Father        ? CA - squamous cell carcinoma  . Colon cancer Neg Hx   . Esophageal cancer Neg Hx   . Rectal cancer Neg Hx   . Stomach cancer Neg Hx    Allergies  Allergen Reactions  . Codeine     REACTION: Throat swelling  . Ivp Dye [Iodinated Diagnostic Agents] Swelling   Current Outpatient Medications on File Prior to Visit  Medication Sig Dispense Refill  . Biotin 2500 MCG CAPS Take 1 capsule by mouth daily. 5,000 mcg    . Cholecalciferol (VITAMIN D3) 2000 units TABS Take 1 tablet by mouth daily.    Marland Kitchen loratadine (CLARITIN) 10 MG tablet Take 10 mg by mouth daily as needed.      . Multiple Vitamin (MULTIVITAMIN) capsule Take 1 capsule by mouth daily.      Marland Kitchen oxyCODONE (OXY IR/ROXICODONE) 5 MG immediate release tablet Take 1 tablet (5 mg total) by mouth every 6 (six) hours as needed for moderate pain, severe pain or breakthrough pain. 10 tablet 0   No current facility-administered medications on file prior to visit.     Review of Systems  Constitutional: Negative for activity change, appetite change, fatigue, fever and unexpected weight change.  HENT: Negative for congestion, ear pain, rhinorrhea,  sinus pressure and sore throat.   Eyes: Negative for pain, redness and visual disturbance.  Respiratory: Negative for cough, shortness of breath and wheezing.   Cardiovascular: Negative for chest pain and palpitations.  Gastrointestinal: Negative for abdominal pain, blood in stool, constipation and diarrhea.  Endocrine: Negative for polydipsia and polyuria.  Genitourinary: Negative for dysuria, frequency and urgency.  Musculoskeletal: Negative for arthralgias, back pain and myalgias.  Skin: Negative for pallor and rash.  Allergic/Immunologic: Negative for environmental allergies.  Neurological: Negative for dizziness, syncope and headaches.  Hematological: Negative for adenopathy. Does not bruise/bleed easily.  Psychiatric/Behavioral: Negative for decreased concentration and dysphoric mood. The patient is not nervous/anxious.        Stressors/busy schedule/recent breast cancer diagnosis        Objective:   Physical Exam Constitutional:      General: She is not in acute distress.    Appearance: Normal appearance. She is well-developed. She is obese. She is not ill-appearing.  HENT:     Head: Normocephalic and atraumatic.     Right Ear: Tympanic membrane, ear canal and external ear normal.     Left Ear: Tympanic membrane, ear canal and external ear normal.     Mouth/Throat:     Mouth: Mucous membranes are moist.     Pharynx: Oropharynx is clear.  Eyes:     General: No scleral icterus.    Conjunctiva/sclera: Conjunctivae normal.     Pupils: Pupils are equal, round, and reactive to light.  Neck:     Musculoskeletal: Normal range of motion and neck supple.     Thyroid: No thyromegaly.     Vascular: No carotid bruit or JVD.  Cardiovascular:     Rate and Rhythm: Normal rate and regular rhythm.     Pulses: Normal pulses.  Heart sounds: Normal heart sounds. No gallop.   Pulmonary:     Effort: Pulmonary effort is normal. No respiratory distress.     Breath sounds: Normal breath  sounds. No wheezing or rales.  Chest:     Chest wall: No tenderness.  Abdominal:     General: Bowel sounds are normal. There is no distension or abdominal bruit.     Palpations: Abdomen is soft. There is no mass.     Tenderness: There is no abdominal tenderness.  Genitourinary:    Comments: Breast exam: No mass, nodules, thickening, tenderness, bulging, retraction, inflamation, nipple discharge or skin changes noted.  No axillary or clavicular LA. (right breast) L breast- surgical scar healing with some mild swelling around it  (not tender to light palpation) Musculoskeletal: Normal range of motion.        General: No tenderness.  Lymphadenopathy:     Cervical: No cervical adenopathy.  Skin:    General: Skin is warm and dry.     Coloration: Skin is not pale.     Findings: No erythema or rash.     Comments: Solar lentigines diffusely   Neurological:     General: No focal deficit present.     Mental Status: She is alert.     Cranial Nerves: No cranial nerve deficit.     Motor: No abnormal muscle tone.     Coordination: Coordination normal.     Deep Tendon Reflexes: Reflexes are normal and symmetric.  Psychiatric:        Mood and Affect: Mood normal.           Assessment & Plan:   Problem List Items Addressed This Visit      Digestive   Hepatic cirrhosis ([place])    Stable labs  Enc wt loss for NASH Followed by GI        Endocrine   Hypothyroidism    Hypothyroidism  Pt has no clinical changes No change in energy level/ hair or skin/ edema and no tremor Lab Results  Component Value Date   TSH 0.41 07/27/2018          Relevant Medications   levothyroxine (SYNTHROID, LEVOTHROID) 175 MCG tablet   metoprolol succinate (TOPROL-XL) 100 MG 24 hr tablet     Other   Hyperlipidemia    Disc goals for lipids and reasons to control them Rev last labs with pt Rev low sat fat diet in detail HDL is up with improved ratio       Relevant Medications   metoprolol  succinate (TOPROL-XL) 100 MG 24 hr tablet   triamterene-hydrochlorothiazide (MAXZIDE-25) 37.5-25 MG tablet   Obesity    Discussed how this problem influences overall health and the risks it imposes  Reviewed plan for weight loss with lower calorie diet (via better food choices and also portion control or program like weight watchers) and exercise building up to or more than 30 minutes 5 days per week including some aerobic activity         Thrombocytopenia ([facility])    This is stable No symptoms /bleeding  Watched by oncology as well       Encounter for Medicare annual wellness exam - Primary    Reviewed health habits including diet and exercise and skin cancer prevention Reviewed appropriate screening tests for age  Also reviewed health mt list, fam hx and immunization status , as well as social and family history   See HPI Rev hearing screen-has already seen audiology Doing well  with breast cancer tx so far  Will plan to schedule dexa once her radiation tx is done  Has adv directive  No worries about cog function  Labs reviewed       Elevated random blood glucose level    Lab Results  Component Value Date   HGBA1C 4.5 (L) 07/27/2018   Not prediabetic  disc imp of low glycemic diet and wt loss to prevent DM2       Malignant neoplasm of upper-inner quadrant of left breast in female, estrogen receptor positive ([place])    Doing well s/p surgery  To start radiation soon  She will likely start aromatase inhibitor after that  She will disc dexa with oncology (should get after radiation as a baseline)

## 2018-08-05 NOTE — Assessment & Plan Note (Signed)
This is stable No symptoms /bleeding  Watched by oncology as well

## 2018-08-05 NOTE — Assessment & Plan Note (Signed)
Hypothyroidism  Pt has no clinical changes No change in energy level/ hair or skin/ edema and no tremor Lab Results  Component Value Date   TSH 0.41 07/27/2018

## 2018-08-05 NOTE — Assessment & Plan Note (Signed)
Discussed how this problem influences overall health and the risks it imposes  Reviewed plan for weight loss with lower calorie diet (via better food choices and also portion control or program like weight watchers) and exercise building up to or more than 30 minutes 5 days per week including some aerobic activity    

## 2018-08-05 NOTE — Assessment & Plan Note (Signed)
Lab Results  Component Value Date   HGBA1C 4.5 (L) 07/27/2018   Not prediabetic  disc imp of low glycemic diet and wt loss to prevent DM2

## 2018-08-05 NOTE — Assessment & Plan Note (Signed)
Disc goals for lipids and reasons to control them Rev last labs with pt Rev low sat fat diet in detail HDL is up with improved ratio

## 2018-08-05 NOTE — Assessment & Plan Note (Signed)
Reviewed health habits including diet and exercise and skin cancer prevention Reviewed appropriate screening tests for age  Also reviewed health mt list, fam hx and immunization status , as well as social and family history   See HPI Rev hearing screen-has already seen audiology Doing well with breast cancer tx so far  Will plan to schedule dexa once her radiation tx is done  Has adv directive  No worries about cog function  Labs reviewed

## 2018-08-05 NOTE — Assessment & Plan Note (Addendum)
Doing well s/p surgery  To start radiation soon  She will likely start aromatase inhibitor after that  She will disc dexa with oncology (should get after radiation as a baseline)

## 2018-08-05 NOTE — Assessment & Plan Note (Addendum)
Stable labs  Enc wt loss for NASH Followed by GI

## 2018-08-07 ENCOUNTER — Telehealth: Payer: Self-pay

## 2018-08-07 NOTE — Telephone Encounter (Signed)
Dollie with [facility] left v/m requesting verification of instructions of metoprolol ER 177m; which meal do you want pt to take metoprolol and is it a once a day dosage.Please advise.

## 2018-08-07 NOTE — Telephone Encounter (Signed)
It does not matter Please ask pt when she take it and if they have to know just tell them the one she takes it with  Thanks

## 2018-08-07 NOTE — Telephone Encounter (Signed)
Left VM letting Dollie know Dr. Marliss Coots comments regarding pt taking it with any meal it didn't matter and advised her to call back if that wasn't good enough

## 2018-08-13 ENCOUNTER — Ambulatory Visit
Admission: RE | Admit: 2018-08-13 | Discharge: 2018-08-13 | Disposition: A | Discharge status: Home / Self Care | Payer: Medicare Other | Source: Ambulatory Visit | Attending: Radiation Oncology | Admitting: Radiation Oncology

## 2018-08-13 ENCOUNTER — Ambulatory Visit: Payer: Medicare Other | Admitting: Physical Therapy

## 2018-08-13 DIAGNOSIS — C50212 Malignant neoplasm of upper-inner quadrant of left female breast: Secondary | ICD-10-CM | POA: Diagnosis not present

## 2018-08-13 DIAGNOSIS — Z510 Encounter for antineoplastic radiation therapy: Secondary | ICD-10-CM | POA: Diagnosis not present

## 2018-08-13 DIAGNOSIS — C50512 Malignant neoplasm of lower-outer quadrant of left female breast: Secondary | ICD-10-CM | POA: Diagnosis not present

## 2018-08-14 ENCOUNTER — Ambulatory Visit
Admission: RE | Admit: 2018-08-14 | Discharge: 2018-08-14 | Disposition: A | Discharge status: Home / Self Care | Payer: Medicare Other | Source: Ambulatory Visit | Attending: Radiation Oncology | Admitting: Radiation Oncology

## 2018-08-14 DIAGNOSIS — C50512 Malignant neoplasm of lower-outer quadrant of left female breast: Secondary | ICD-10-CM | POA: Diagnosis not present

## 2018-08-14 DIAGNOSIS — C50212 Malignant neoplasm of upper-inner quadrant of left female breast: Secondary | ICD-10-CM | POA: Diagnosis not present

## 2018-08-14 DIAGNOSIS — Z510 Encounter for antineoplastic radiation therapy: Secondary | ICD-10-CM | POA: Diagnosis not present

## 2018-08-15 ENCOUNTER — Other Ambulatory Visit: Payer: Self-pay

## 2018-08-15 ENCOUNTER — Ambulatory Visit
Admission: RE | Admit: 2018-08-15 | Discharge: 2018-08-15 | Disposition: A | Discharge status: Home / Self Care | Payer: Medicare Other | Source: Ambulatory Visit | Attending: Radiation Oncology | Admitting: Radiation Oncology

## 2018-08-15 DIAGNOSIS — C50212 Malignant neoplasm of upper-inner quadrant of left female breast: Secondary | ICD-10-CM | POA: Diagnosis not present

## 2018-08-15 DIAGNOSIS — Z510 Encounter for antineoplastic radiation therapy: Secondary | ICD-10-CM | POA: Diagnosis not present

## 2018-08-15 DIAGNOSIS — C50512 Malignant neoplasm of lower-outer quadrant of left female breast: Secondary | ICD-10-CM | POA: Diagnosis not present

## 2018-08-16 ENCOUNTER — Other Ambulatory Visit: Payer: Self-pay

## 2018-08-16 ENCOUNTER — Encounter: Payer: Self-pay | Admitting: Physical Therapy

## 2018-08-16 ENCOUNTER — Ambulatory Visit: Payer: Medicare Other | Attending: General Surgery | Admitting: Physical Therapy

## 2018-08-16 ENCOUNTER — Ambulatory Visit
Admission: RE | Admit: 2018-08-16 | Discharge: 2018-08-16 | Disposition: A | Discharge status: Home / Self Care | Payer: Medicare Other | Source: Ambulatory Visit | Attending: Radiation Oncology | Admitting: Radiation Oncology

## 2018-08-16 DIAGNOSIS — R293 Abnormal posture: Secondary | ICD-10-CM | POA: Insufficient documentation

## 2018-08-16 DIAGNOSIS — Z170 Estrogen receptor positive status [ER+]: Secondary | ICD-10-CM | POA: Diagnosis not present

## 2018-08-16 DIAGNOSIS — Z510 Encounter for antineoplastic radiation therapy: Secondary | ICD-10-CM | POA: Diagnosis not present

## 2018-08-16 DIAGNOSIS — C50512 Malignant neoplasm of lower-outer quadrant of left female breast: Secondary | ICD-10-CM | POA: Diagnosis not present

## 2018-08-16 DIAGNOSIS — Z483 Aftercare following surgery for neoplasm: Secondary | ICD-10-CM

## 2018-08-16 DIAGNOSIS — C50212 Malignant neoplasm of upper-inner quadrant of left female breast: Secondary | ICD-10-CM | POA: Diagnosis not present

## 2018-08-16 NOTE — Therapy (Signed)
[place], [place], [address] Phone: [phone]   Fax:  [phone]  Physical Therapy Treatment  Patient Details  Name: Selena Johnson MRN: [id number] Date of Birth: 12/06/1950 Referring Provider (PT): Dr. Rolm Bookbinder   Encounter Date: 08/16/2018  PT End of Session - 08/16/18 1045    Visit Number  2    Number of Visits  2    PT Start Time  1024    PT Stop Time  1050    PT Time Calculation (min)  26 min    Activity Tolerance  Patient tolerated treatment well    Behavior During Therapy  [facility] for tasks assessed/performed       Past Medical History:  Diagnosis Date  . Allergy   . Cancer ([place])    basal cell skin CA  . Chronic kidney disease    hx of kidney stones  . Conjunctivitis    chronic  . Fatty liver 2015  . GERD (gastroesophageal reflux disease)   . Glaucoma   . Hearing loss in left ear   . History of kidney stones   . Hyperlipidemia    no per pt  . Hypertension   . Menopausal syndrome   . NASH (nonalcoholic steatohepatitis)    with cirrhosis and mild esoph varicies  . Primary hypothyroidism     Past Surgical History:  Procedure Laterality Date  . ABDOMINAL HYSTERECTOMY     partial ? prolapse  . BREAST LUMPECTOMY WITH RADIOACTIVE SEED AND SENTINEL LYMPH NODE BIOPSY Left 07/12/2018   Procedure: LEFT BREAST LUMPECTOMY WITH RADIOACTIVE SEED AND LEFT AXILLARY SENTINEL LYMPH NODE BIOPSY;  Surgeon: Rolm Bookbinder, MD;  Location: [facility];  Service: General;  Laterality: Left;  . CHOLECYSTECTOMY    . ESOPHAGOGASTRODUODENOSCOPY    . TONSILLECTOMY      There were no vitals filed for this visit.  Subjective Assessment - 08/16/18 1027    Subjective  Patient reports she underwent a left lumpectomy and sentinel node biopsy (0/1 nodes positive) on 07/12/2018. She began radiation on 08/13/2018 and she reports she feels like she has recovered and is doing well overall.    Pertinent History   Patient was diagnosed on 06/12/2018 with left grade I invasive ductal carcinoma breast cancer. It is ER/PR positive and HER2 negative with a Ki67 of 3%. Patient reports she underwent a left lumpectomy and sentinel node biopsy (0/1 nodes positive) on 07/12/2018.    Patient Stated Goals  see if my arm is ok    Currently in Pain?  No/denies         [facility] PT Assessment - 08/16/18 0001      Assessment   Medical Diagnosis  s/p left lumpectomy and SLNB    Referring Provider (PT)  Dr. Rolm Bookbinder    Onset Date/Surgical Date  07/12/18    Hand Dominance  Right    Prior Therapy  Baselines      Precautions   Precautions  Other (comment)    Precaution Comments  undergoing radiation      Restrictions   Weight Bearing Restrictions  No      Balance Screen   Has the patient fallen in the past 6 months  No    Has the patient had a decrease in activity level because of a fear of falling?   No    Is the patient reluctant to leave their home because of a fear of falling?   No  Home Environment   Living Environment  Private residence    Living Arrangements  Spouse/significant other    Available Help at Discharge  Family      Prior Function   Level of [place]  Retired    Leisure  She walks a few times per week for 20-25 minutes with her dog      Cognition   Overall Cognitive Status  Within Functional Limits for tasks assessed      Observation/Other Assessments   Observations  Axillary and breast incisions both well healed without any redness or concerns.      Posture/Postural Control   Posture/Postural Control  Postural limitations    Postural Limitations  Rounded Shoulders;Forward head      ROM / Strength   AROM / PROM / Strength  AROM      AROM   AROM Assessment Site  Shoulder    Right/Left Shoulder  Left    Left Shoulder Extension  56 Degrees    Left Shoulder Flexion  139 Degrees    Left Shoulder ABduction  144 Degrees    Left Shoulder Internal  Rotation  66 Degrees    Left Shoulder External Rotation  82 Degrees        LYMPHEDEMA/ONCOLOGY QUESTIONNAIRE - 08/16/18 1030      Type   Cancer Type  Left breast cancer      Surgeries   Lumpectomy Date  07/12/18    Sentinel Lymph Node Biopsy Date  07/12/18    Number Lymph Nodes Removed  1      Treatment   Active Chemotherapy Treatment  No    Past Chemotherapy Treatment  No    Active Radiation Treatment  Yes    Date  08/13/18    Body Site  left breast    Past Radiation Treatment  No    Current Hormone Treatment  No    Past Hormone Therapy  No      What other symptoms do you have   Are you Having Heaviness or Tightness  No    Are you having Pain  No    Are you having pitting edema  No    Is it Hard or Difficult finding clothes that fit  No    Do you have infections  No    Is there Decreased scar mobility  No    Stemmer Sign  No      Lymphedema Assessments   Lymphedema Assessments  Upper extremities      Right Upper Extremity Lymphedema   10 cm Proximal to Olecranon Process  36 cm    Olecranon Process  29.5 cm    10 cm Proximal to Ulnar Styloid Process  27.7 cm    Just Proximal to Ulnar Styloid Process  20.1 cm    Across Hand at [employer]  20.6 cm    At [place] of 2nd Digit  6.9 cm      Left Upper Extremity Lymphedema   10 cm Proximal to Olecranon Process  34.5 cm    Olecranon Process  28.7 cm    10 cm Proximal to Ulnar Styloid Process  26.3 cm    Just Proximal to Ulnar Styloid Process  19.3 cm    Across Hand at [employer]  19.9 cm    At [place] of 2nd Digit  6.9 cm        Quick Dash - 08/16/18 0001    Open a  tight or new jar  No difficulty    Do heavy household chores (wash walls, wash floors)  No difficulty    Carry a shopping bag or briefcase  No difficulty    Wash your back  No difficulty    Use a knife to cut food  No difficulty    Recreational activities in which you take some force or impact through your arm, shoulder, or hand (golf, hammering,  tennis)  No difficulty    During the past week, to what extent has your arm, shoulder or hand problem interfered with your normal social activities with family, friends, neighbors, or groups?  Not at all    During the past week, to what extent has your arm, shoulder or hand problem limited your work or other regular daily activities  Not at all    Arm, shoulder, or hand pain.  None    Tingling (pins and needles) in your arm, shoulder, or hand  None    Difficulty Sleeping  No difficulty    DASH Score  0 %                     PT Education - 08/16/18 1038    Education Details  Closed chain shoulder flexion and abduction    Person(s) Educated  Patient    Methods  Explanation;Demonstration    Comprehension  Returned demonstration;Verbalized understanding          PT Long Term Goals - 08/16/18 1158      PT LONG TERM GOAL #1   Title  Patient will demonstrate she has regained shoulder ROM and function post operatively compared to baselines.    Time  8    Period  Weeks    Status  Achieved            Plan - 08/16/18 1048    Clinical Impression Statement  Patient is doing very well s/p her left lumpectomy     Rehab Potential  Excellent    Clinical Impairments Affecting Rehab Potential  None    PT Treatment/Interventions  ADLs/Self Care Home Management;Patient/family education;Therapeutic exercise    PT Next Visit Plan  D/C    PT Home Exercise Plan  Post op shoulder ROM HEP    Consulted and Agree with Plan of Care  Patient       Patient will benefit from skilled therapeutic intervention in order to improve the following deficits and impairments:  Impaired UE functional use, Pain, Postural dysfunction, Decreased knowledge of precautions, Decreased range of motion  Visit Diagnosis: Malignant neoplasm of upper-inner quadrant of left breast in female, estrogen receptor positive (HCC)  Abnormal posture  Aftercare following surgery for neoplasm     Problem  List Patient Active Problem List   Diagnosis Date Noted  . Malignant neoplasm of upper-inner quadrant of left breast in female, estrogen receptor positive ([place]) 06/27/2018  . Left breast mass 06/17/2018  . Elevated random blood glucose level 07/28/2017  . Skin cancer screening 05/05/2017  . Colon cancer screening 07/21/2015  . Estrogen deficiency 07/21/2015  . Calcification of right breast 05/06/2015  . Encounter for Medicare annual wellness exam 07/20/2014  . Unspecified constipation 10/25/2012  . Hepatic cirrhosis ([place]) 10/23/2012  . Hypokalemia 07/13/2012  . Thrombocytopenia ([place]) 11/10/2010  . Routine general medical examination at a health care facility 11/04/2010  . Hyperlipidemia 03/18/2008  . Obesity 03/18/2008  . TRANSAMINASES, SERUM, ELEVATED 03/18/2008  . MENOPAUSE-RELATED VASOMOTOR SYMPTOMS, HOT FLASHES 10/23/2006  . Hypothyroidism  10/11/2006  . GLAUCOMA 10/11/2006  . Essential hypertension 10/11/2006  . GERD 10/11/2006  . History of kidney stones 10/11/2006   PHYSICAL THERAPY DISCHARGE SUMMARY  Visits from Start of Care: 2  Current functional level related to goals / functional outcomes: Goals met. See above for objective findings.   Remaining deficits: Very mild flexion and abduction left shoulder ROM deficits.   Education / Equipment: HEP and lymphedema risk reduction information.  Plan: Patient agrees to discharge.  Patient goals were met. Patient is being discharged due to meeting the stated rehab goals.  ?????         Annia Friendly, [place] 08/16/18 12:00 PM  [place], [place], [address] Phone: [phone]   Fax:  [phone]  Name: Selena Johnson MRN: [id number] Date of Birth: [birth year]

## 2018-08-16 NOTE — Patient Instructions (Signed)
Closed Chain: Shoulder Abduction / Adduction - on Wall    One hand on wall, step to side and return. Stepping causes shoulder to abduct and adduct. Step _5__ times, holding 10 seconds, __3_ times per day.  http://ss.exer.us/267   Copyright  VHI. All rights reserved.  Closed Chain: Shoulder Flexion / Extension - on Wall    Hands on wall, step backward. Return. Stepping causes shoulder flexion and extension Do _5__ times, holding 10 seconds, _3__ times per day.  http://ss.exer.us/265   Copyright  VHI. All rights reserved.

## 2018-08-17 ENCOUNTER — Ambulatory Visit
Admission: RE | Admit: 2018-08-17 | Discharge: 2018-08-17 | Disposition: A | Discharge status: Home / Self Care | Payer: Medicare Other | Source: Ambulatory Visit | Attending: Radiation Oncology | Admitting: Radiation Oncology

## 2018-08-17 DIAGNOSIS — Z170 Estrogen receptor positive status [ER+]: Secondary | ICD-10-CM

## 2018-08-17 DIAGNOSIS — C50212 Malignant neoplasm of upper-inner quadrant of left female breast: Secondary | ICD-10-CM | POA: Diagnosis not present

## 2018-08-17 DIAGNOSIS — Z510 Encounter for antineoplastic radiation therapy: Secondary | ICD-10-CM | POA: Diagnosis not present

## 2018-08-17 DIAGNOSIS — C50512 Malignant neoplasm of lower-outer quadrant of left female breast: Secondary | ICD-10-CM | POA: Diagnosis not present

## 2018-08-17 MED ORDER — RADIAPLEXRX EX GEL
Freq: Once | CUTANEOUS | Status: AC
  Administered 2018-08-17: 16:00:00 via TOPICAL

## 2018-08-17 MED ORDER — ALRA NON-METALLIC DEODORANT (RAD-ONC)
1.0000 "application " | Freq: Once | TOPICAL | Status: AC
  Administered 2018-08-17: 1 via TOPICAL

## 2018-08-17 NOTE — Progress Notes (Signed)
error 

## 2018-08-17 NOTE — Progress Notes (Signed)
Pt here for patient teaching.  Pt given Radiation and You booklet, skin care instructions, [place] deodorant and Radiaplex gel.  Reviewed areas of pertinence such as fatigue, hair loss, skin changes, breast tenderness and breast swelling . Pt able to give teach back of to pat skin and use unscented/gentle soap,apply Radiaplex bid, avoid applying anything to skin within 4 hours of treatment, avoid wearing an under wire bra and to use an electric razor if they must shave. Pt demonstrated understanding, needs reinforcement, no evidence of learning, refused teaching and  of information given and will contact nursing with any questions or concerns.     Http://rtanswers.org/treatmentinformation/whattoexpect/index

## 2018-08-20 ENCOUNTER — Ambulatory Visit
Admission: RE | Admit: 2018-08-20 | Discharge: 2018-08-20 | Disposition: A | Discharge status: Home / Self Care | Payer: Medicare Other | Source: Ambulatory Visit | Attending: Radiation Oncology | Admitting: Radiation Oncology

## 2018-08-20 DIAGNOSIS — Z510 Encounter for antineoplastic radiation therapy: Secondary | ICD-10-CM | POA: Diagnosis not present

## 2018-08-20 DIAGNOSIS — C50512 Malignant neoplasm of lower-outer quadrant of left female breast: Secondary | ICD-10-CM | POA: Diagnosis not present

## 2018-08-20 DIAGNOSIS — C50212 Malignant neoplasm of upper-inner quadrant of left female breast: Secondary | ICD-10-CM | POA: Diagnosis not present

## 2018-08-21 ENCOUNTER — Other Ambulatory Visit: Payer: Self-pay

## 2018-08-21 ENCOUNTER — Ambulatory Visit
Admission: RE | Admit: 2018-08-21 | Discharge: 2018-08-21 | Disposition: A | Discharge status: Home / Self Care | Payer: Medicare Other | Source: Ambulatory Visit | Attending: Radiation Oncology | Admitting: Radiation Oncology

## 2018-08-21 DIAGNOSIS — C50212 Malignant neoplasm of upper-inner quadrant of left female breast: Secondary | ICD-10-CM | POA: Diagnosis not present

## 2018-08-21 DIAGNOSIS — Z510 Encounter for antineoplastic radiation therapy: Secondary | ICD-10-CM | POA: Diagnosis not present

## 2018-08-21 DIAGNOSIS — C50512 Malignant neoplasm of lower-outer quadrant of left female breast: Secondary | ICD-10-CM | POA: Diagnosis not present

## 2018-08-22 ENCOUNTER — Other Ambulatory Visit: Payer: Self-pay

## 2018-08-22 ENCOUNTER — Ambulatory Visit
Admission: RE | Admit: 2018-08-22 | Discharge: 2018-08-22 | Disposition: A | Discharge status: Home / Self Care | Payer: Medicare Other | Source: Ambulatory Visit | Attending: Radiation Oncology | Admitting: Radiation Oncology

## 2018-08-22 DIAGNOSIS — C50512 Malignant neoplasm of lower-outer quadrant of left female breast: Secondary | ICD-10-CM | POA: Diagnosis not present

## 2018-08-22 DIAGNOSIS — Z510 Encounter for antineoplastic radiation therapy: Secondary | ICD-10-CM | POA: Diagnosis not present

## 2018-08-22 DIAGNOSIS — C50212 Malignant neoplasm of upper-inner quadrant of left female breast: Secondary | ICD-10-CM | POA: Diagnosis not present

## 2018-08-23 ENCOUNTER — Ambulatory Visit
Admission: RE | Admit: 2018-08-23 | Discharge: 2018-08-23 | Disposition: A | Discharge status: Home / Self Care | Payer: Medicare Other | Source: Ambulatory Visit | Attending: Radiation Oncology | Admitting: Radiation Oncology

## 2018-08-23 DIAGNOSIS — C50512 Malignant neoplasm of lower-outer quadrant of left female breast: Secondary | ICD-10-CM | POA: Diagnosis not present

## 2018-08-23 DIAGNOSIS — Z510 Encounter for antineoplastic radiation therapy: Secondary | ICD-10-CM | POA: Diagnosis not present

## 2018-08-23 DIAGNOSIS — C50212 Malignant neoplasm of upper-inner quadrant of left female breast: Secondary | ICD-10-CM | POA: Diagnosis not present

## 2018-08-24 ENCOUNTER — Ambulatory Visit
Admission: RE | Admit: 2018-08-24 | Discharge: 2018-08-24 | Disposition: A | Discharge status: Home / Self Care | Payer: Medicare Other | Source: Ambulatory Visit | Attending: Radiation Oncology | Admitting: Radiation Oncology

## 2018-08-24 ENCOUNTER — Other Ambulatory Visit: Payer: Self-pay

## 2018-08-24 DIAGNOSIS — Z510 Encounter for antineoplastic radiation therapy: Secondary | ICD-10-CM | POA: Diagnosis not present

## 2018-08-24 DIAGNOSIS — C50212 Malignant neoplasm of upper-inner quadrant of left female breast: Secondary | ICD-10-CM | POA: Diagnosis not present

## 2018-08-24 DIAGNOSIS — C50512 Malignant neoplasm of lower-outer quadrant of left female breast: Secondary | ICD-10-CM | POA: Diagnosis not present

## 2018-08-27 ENCOUNTER — Other Ambulatory Visit: Payer: Self-pay

## 2018-08-27 ENCOUNTER — Ambulatory Visit
Admission: RE | Admit: 2018-08-27 | Discharge: 2018-08-27 | Disposition: A | Discharge status: Home / Self Care | Payer: Medicare Other | Source: Ambulatory Visit | Attending: Radiation Oncology | Admitting: Radiation Oncology

## 2018-08-27 DIAGNOSIS — Z510 Encounter for antineoplastic radiation therapy: Secondary | ICD-10-CM | POA: Diagnosis not present

## 2018-08-27 DIAGNOSIS — C50512 Malignant neoplasm of lower-outer quadrant of left female breast: Secondary | ICD-10-CM | POA: Diagnosis not present

## 2018-08-27 DIAGNOSIS — C50212 Malignant neoplasm of upper-inner quadrant of left female breast: Secondary | ICD-10-CM | POA: Diagnosis not present

## 2018-08-28 ENCOUNTER — Other Ambulatory Visit: Payer: Self-pay

## 2018-08-28 ENCOUNTER — Ambulatory Visit
Admission: RE | Admit: 2018-08-28 | Discharge: 2018-08-28 | Disposition: A | Discharge status: Home / Self Care | Payer: Medicare Other | Source: Ambulatory Visit | Attending: Radiation Oncology | Admitting: Radiation Oncology

## 2018-08-28 DIAGNOSIS — Z510 Encounter for antineoplastic radiation therapy: Secondary | ICD-10-CM | POA: Diagnosis not present

## 2018-08-28 DIAGNOSIS — C50512 Malignant neoplasm of lower-outer quadrant of left female breast: Secondary | ICD-10-CM | POA: Diagnosis not present

## 2018-08-28 DIAGNOSIS — C50212 Malignant neoplasm of upper-inner quadrant of left female breast: Secondary | ICD-10-CM | POA: Diagnosis not present

## 2018-08-29 ENCOUNTER — Other Ambulatory Visit: Payer: Self-pay

## 2018-08-29 ENCOUNTER — Ambulatory Visit
Admission: RE | Admit: 2018-08-29 | Discharge: 2018-08-29 | Disposition: A | Discharge status: Home / Self Care | Payer: Medicare Other | Source: Ambulatory Visit | Attending: Radiation Oncology | Admitting: Radiation Oncology

## 2018-08-29 DIAGNOSIS — Z510 Encounter for antineoplastic radiation therapy: Secondary | ICD-10-CM | POA: Diagnosis not present

## 2018-08-29 DIAGNOSIS — C50512 Malignant neoplasm of lower-outer quadrant of left female breast: Secondary | ICD-10-CM | POA: Diagnosis not present

## 2018-08-29 DIAGNOSIS — C50212 Malignant neoplasm of upper-inner quadrant of left female breast: Secondary | ICD-10-CM | POA: Diagnosis not present

## 2018-08-30 ENCOUNTER — Ambulatory Visit
Admission: RE | Admit: 2018-08-30 | Discharge: 2018-08-30 | Disposition: A | Discharge status: Home / Self Care | Payer: Medicare Other | Source: Ambulatory Visit | Attending: Radiation Oncology | Admitting: Radiation Oncology

## 2018-08-30 ENCOUNTER — Other Ambulatory Visit: Payer: Self-pay

## 2018-08-30 DIAGNOSIS — C50512 Malignant neoplasm of lower-outer quadrant of left female breast: Secondary | ICD-10-CM | POA: Diagnosis not present

## 2018-08-30 DIAGNOSIS — C50212 Malignant neoplasm of upper-inner quadrant of left female breast: Secondary | ICD-10-CM | POA: Diagnosis not present

## 2018-08-30 DIAGNOSIS — Z510 Encounter for antineoplastic radiation therapy: Secondary | ICD-10-CM | POA: Diagnosis not present

## 2018-08-31 ENCOUNTER — Other Ambulatory Visit: Payer: Self-pay

## 2018-08-31 ENCOUNTER — Ambulatory Visit
Admission: RE | Admit: 2018-08-31 | Discharge: 2018-08-31 | Disposition: A | Discharge status: Home / Self Care | Payer: Medicare Other | Source: Ambulatory Visit | Attending: Radiation Oncology | Admitting: Radiation Oncology

## 2018-08-31 ENCOUNTER — Ambulatory Visit: Payer: Medicare Other | Admitting: Radiation Oncology

## 2018-08-31 DIAGNOSIS — C50512 Malignant neoplasm of lower-outer quadrant of left female breast: Secondary | ICD-10-CM | POA: Diagnosis not present

## 2018-08-31 DIAGNOSIS — Z510 Encounter for antineoplastic radiation therapy: Secondary | ICD-10-CM | POA: Diagnosis not present

## 2018-08-31 DIAGNOSIS — C50212 Malignant neoplasm of upper-inner quadrant of left female breast: Secondary | ICD-10-CM | POA: Diagnosis not present

## 2018-09-03 ENCOUNTER — Ambulatory Visit
Admission: RE | Admit: 2018-09-03 | Discharge: 2018-09-03 | Disposition: A | Discharge status: Home / Self Care | Payer: Medicare Other | Source: Ambulatory Visit | Attending: Radiation Oncology | Admitting: Radiation Oncology

## 2018-09-03 ENCOUNTER — Other Ambulatory Visit: Payer: Self-pay

## 2018-09-03 DIAGNOSIS — C50512 Malignant neoplasm of lower-outer quadrant of left female breast: Secondary | ICD-10-CM | POA: Diagnosis not present

## 2018-09-03 DIAGNOSIS — C50212 Malignant neoplasm of upper-inner quadrant of left female breast: Secondary | ICD-10-CM | POA: Diagnosis not present

## 2018-09-03 DIAGNOSIS — Z510 Encounter for antineoplastic radiation therapy: Secondary | ICD-10-CM | POA: Diagnosis not present

## 2018-09-04 ENCOUNTER — Other Ambulatory Visit: Payer: Self-pay

## 2018-09-04 ENCOUNTER — Ambulatory Visit
Admission: RE | Admit: 2018-09-04 | Discharge: 2018-09-04 | Disposition: A | Discharge status: Home / Self Care | Payer: Medicare Other | Source: Ambulatory Visit | Attending: Radiation Oncology | Admitting: Radiation Oncology

## 2018-09-04 DIAGNOSIS — Z510 Encounter for antineoplastic radiation therapy: Secondary | ICD-10-CM | POA: Diagnosis not present

## 2018-09-04 DIAGNOSIS — C50512 Malignant neoplasm of lower-outer quadrant of left female breast: Secondary | ICD-10-CM | POA: Diagnosis not present

## 2018-09-04 DIAGNOSIS — C50212 Malignant neoplasm of upper-inner quadrant of left female breast: Secondary | ICD-10-CM | POA: Diagnosis not present

## 2018-09-05 ENCOUNTER — Ambulatory Visit
Admission: RE | Admit: 2018-09-05 | Discharge: 2018-09-05 | Disposition: A | Discharge status: Home / Self Care | Payer: Medicare Other | Source: Ambulatory Visit | Attending: Radiation Oncology | Admitting: Radiation Oncology

## 2018-09-05 ENCOUNTER — Other Ambulatory Visit: Payer: Self-pay

## 2018-09-05 DIAGNOSIS — C50512 Malignant neoplasm of lower-outer quadrant of left female breast: Secondary | ICD-10-CM | POA: Insufficient documentation

## 2018-09-05 DIAGNOSIS — Z510 Encounter for antineoplastic radiation therapy: Secondary | ICD-10-CM | POA: Diagnosis not present

## 2018-09-05 DIAGNOSIS — C50212 Malignant neoplasm of upper-inner quadrant of left female breast: Secondary | ICD-10-CM | POA: Diagnosis not present

## 2018-09-06 ENCOUNTER — Other Ambulatory Visit: Payer: Self-pay

## 2018-09-06 ENCOUNTER — Ambulatory Visit
Admission: RE | Admit: 2018-09-06 | Discharge: 2018-09-06 | Disposition: A | Discharge status: Home / Self Care | Payer: Medicare Other | Source: Ambulatory Visit | Attending: Radiation Oncology | Admitting: Radiation Oncology

## 2018-09-06 DIAGNOSIS — Z510 Encounter for antineoplastic radiation therapy: Secondary | ICD-10-CM | POA: Diagnosis not present

## 2018-09-06 DIAGNOSIS — C50512 Malignant neoplasm of lower-outer quadrant of left female breast: Secondary | ICD-10-CM | POA: Diagnosis not present

## 2018-09-06 DIAGNOSIS — C50212 Malignant neoplasm of upper-inner quadrant of left female breast: Secondary | ICD-10-CM | POA: Diagnosis not present

## 2018-09-07 ENCOUNTER — Encounter: Payer: Self-pay | Admitting: Radiation Oncology

## 2018-09-07 ENCOUNTER — Other Ambulatory Visit: Payer: Self-pay

## 2018-09-07 ENCOUNTER — Ambulatory Visit
Admission: RE | Admit: 2018-09-07 | Discharge: 2018-09-07 | Disposition: A | Discharge status: Home / Self Care | Payer: Medicare Other | Source: Ambulatory Visit | Attending: Radiation Oncology | Admitting: Radiation Oncology

## 2018-09-07 DIAGNOSIS — C50512 Malignant neoplasm of lower-outer quadrant of left female breast: Secondary | ICD-10-CM | POA: Diagnosis not present

## 2018-09-07 DIAGNOSIS — Z510 Encounter for antineoplastic radiation therapy: Secondary | ICD-10-CM | POA: Diagnosis not present

## 2018-09-07 DIAGNOSIS — C50212 Malignant neoplasm of upper-inner quadrant of left female breast: Secondary | ICD-10-CM | POA: Diagnosis not present

## 2018-09-13 NOTE — Progress Notes (Signed)
[place]  [phone] [phone]     ID: Selena Johnson DOB: [birth year]  MR#: [id number]  BZJ#:[id number]  Patient Care Team: Abner Greenspan, MD as PCP - General Vin-Parikh, Deirdre Peer, MD as Referring Physician (Ophthalmology) Glennie Isle, PA-C as Physician Assistant (Physician Assistant) Selena Johnson, Selena Dad, MD as Consulting Physician (Oncology) Rolm Bookbinder, MD as Consulting Physician (General Surgery) Kyung Rudd, MD as Consulting Physician (Radiation Oncology) Druscilla Brownie, MD as Referring Physician (Dermatology) OTHER MD:    CHIEF COMPLAINT: Estrogen receptor positive breast cancer   CURRENT TREATMENT: Anastrozole  I connected with Selena Johnson on 09/20/18 at 10:00 AM EDT by telephone visit and verified that I am speaking with the correct person using two identifiers.   I discussed the limitations, risks, security and privacy concerns of performing an evaluation and management service by telemedicine and the availability of in-person appointments. I also discussed with the patient that there may be a patient responsible charge related to this service. The patient expressed understanding and agreed to proceed.   Other persons participating in the visit and their role in the encounter: none  Patient's location: home Provider's location: cancer clinic   HISTORY OF CURRENT ILLNESS: From the original intake note:  Selena Johnson underwent routine screening mammography at [facility] on 02/28/2018 showing an irregularity in the left breast. On 03/02/2018, she followed up with a unilateral left diagnostic mammogram and unilateral left ultrasound where a biopsy was recommended. A biopsy was performed on 03/07/2018. Pathology from the procedure ([id number]) showed benign breast tissue with no specific histopathologic changes, negative for carcinoma. A three month follow up was recommended.  At her follow up, she underwent bilateral  diagnostic mammography with tomography and left breast ultrasonography at [facility] on 06/12/2018 showing: Breast Density Category C. There os a 0.7 cm oval mass in the left breast at 9 o'clock posterior depth 9 cm from the nipple. There is a questioned mild architectural distortion associated with the mass. Biopsy clip is noted 1 cm medial and 1.7 cm inferior to the mass. No other significant masses or calcifications are seen in the breast. On ultrasound, there is a 0.9 x 0.7 cm oval mass with an angular margin in the left breast at 9:30 o'clock 8 cm from the nipple. Color flow imaging demonstrates that there is vascularity present. Elastography imaging assessment is soft. No definite biopsy clip is associated with this mass. This mass corresponds to the mammographic mass. No significant abnormalities were seen sonographically in the left axilla.   Accordingly on 06/20/2018 she proceeded to biopsy of the left breast area in question. The pathology from this procedure showed ([id number]): invasive ductal carcinoma, grade I. Prognostic indicators significant for: estrogen receptor, 100% positive and progesterone receptor, 20% positive, both with strong staining intensity. Proliferation marker Ki67 at 3%. HER2 negative (1+) by immunohistochemistry.  The patient's subsequent history is as detailed below.   INTERVAL HISTORY: Selena Johnson was contacted today for follow-up of her estrogen receptor positive breast cancer.  Since her last visit here she underwent left lumpectomy with sentinel lymph node sampling, on 07/12/2018.  The final pathology ([id number]) showed an invasive ductal carcinoma, grade 1, measuring 0.8 cm.  Margins were close but negative.  A single sentinel lymph node was clear.  She then received adjuvant radiation, completed 09/07/2018  The point of today's visit is to discuss antiestrogen therapy   REVIEW OF SYSTEMS: Selena Johnson did well with her surgery but has had more problems with her radiation.  She is still fatigued.  She still having desquamation.  She is using a vitamin E cream which is helping a little but not much.  She still has the radio Plex cream but she has not been using that.  She is doing some yard work but when she does a little bit she becomes very fatigued and almost faints and goes back inside.  She tells me she is eating and drinking well.  Aside from these issues a detailed review of systems today was stable  PAST MEDICAL HISTORY: Past Medical History:  Diagnosis Date  . Allergy   . Cancer ([place])    basal cell skin CA  . Chronic kidney disease    hx of kidney stones  . Conjunctivitis    chronic  . Fatty liver 2015  . GERD (gastroesophageal reflux disease)   . Glaucoma   . Hearing loss in left ear   . History of kidney stones   . Hyperlipidemia    no per pt  . Hypertension   . Menopausal syndrome   . NASH (nonalcoholic steatohepatitis)    with cirrhosis and mild esoph varicies  . Primary hypothyroidism      PAST SURGICAL HISTORY: Past Surgical History:  Procedure Laterality Date  . ABDOMINAL HYSTERECTOMY     partial ? prolapse  . BREAST LUMPECTOMY WITH RADIOACTIVE SEED AND SENTINEL LYMPH NODE BIOPSY Left 07/12/2018   Procedure: LEFT BREAST LUMPECTOMY WITH RADIOACTIVE SEED AND LEFT AXILLARY SENTINEL LYMPH NODE BIOPSY;  Surgeon: Rolm Bookbinder, MD;  Location: [place];  Service: General;  Laterality: Left;  . CHOLECYSTECTOMY    . ESOPHAGOGASTRODUODENOSCOPY    . TONSILLECTOMY       FAMILY HISTORY: Family History  Problem Relation Age of Onset  . Heart disease Mother        CAD  . Hypertension Mother   . Glaucoma Mother   . Heart disease Father        CAD  . Diabetes Father   . Hypertension Father   . Cancer Father        ? CA - squamous cell carcinoma  . Colon cancer Neg Hx   . Esophageal cancer Neg Hx   . Rectal cancer Neg Hx   . Stomach cancer Neg Hx    Livie's father died from squamous cell cancer of the lung at age 71. Patients'  mother died from stroke and sepsis at age 39. The patient has 1 brother and 1 sister. Her brother has advanced renal cell CA and is being treated at [facility]. Patient denies anyone in her family having breast, ovarian, prostate, or pancreatic cancer.    GYNECOLOGIC HISTORY:  No LMP recorded. Patient has had a hysterectomy. 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 She has no birth children and one deceased step-child. Chaley has no grandchildren. She attends a [employer].   ADVANCED DIRECTIVES: Her husband, Nadara Mustard.   HEALTH MAINTENANCE: Social History   Tobacco Use  . Smoking status: Former Smoker    Last attempt to quit: 06/06/1982    Years since quitting: 36.2  . Smokeless tobacco: Never Used  Substance Use Topics  . Alcohol use: Yes    Alcohol/week: 0.0 standard drinks    Comment: wine- rare  . Drug use: No  Colonoscopy: yes, Nandigam  PAP:   Bone density: 07/30/2015 at [facility], score -0.3   Allergies  Allergen Reactions  . Codeine     REACTION: Throat swelling  . Ivp Dye [Iodinated Diagnostic Agents] Swelling    Current Outpatient Medications  Medication Sig Dispense Refill  . Biotin 2500 MCG CAPS Take 1 capsule by mouth daily. 5,000 mcg    . Cholecalciferol (VITAMIN D3) 2000 units TABS Take 1 tablet by mouth daily.    Marland Kitchen levothyroxine (SYNTHROID, LEVOTHROID) 175 MCG tablet Take 1 tablet (175 mcg total) by mouth daily before breakfast. 90 tablet 3  . loratadine (CLARITIN) 10 MG tablet Take 10 mg by mouth daily as needed.      . metoprolol succinate (TOPROL-XL) 100 MG 24 hr tablet Take with or immediately following a meal. 90 tablet 3  . Multiple Vitamin (MULTIVITAMIN) capsule Take 1 capsule by mouth daily.      Marland Kitchen oxyCODONE (OXY  IR/ROXICODONE) 5 MG immediate release tablet Take 1 tablet (5 mg total) by mouth every 6 (six) hours as needed for moderate pain, severe pain or breakthrough pain. 10 tablet 0  . potassium chloride SA (K-DUR,KLOR-CON) 20 MEQ tablet Take 1 tablet by mouth every day 90 tablet 3  . triamterene-hydrochlorothiazide (MAXZIDE-25) 37.5-25 MG tablet Take 1 tablet by mouth daily. 90 tablet 3   No current facility-administered medications for this visit.      OBJECTIVE: Middle-aged white woman contacted by phone  There were no vitals filed for this visit.   There is no height or weight on file to calculate BMI.   Wt Readings from Last 3 Encounters:  08/03/18 213 lb 6 oz (96.8 kg)  07/26/18 214 lb 9.6 oz (97.3 kg)  07/12/18 211 lb (95.7 kg)      ECOG FS:0 - Asymptomatic   LAB RESULTS:  CMP     Component Value Date/Time   NA 142 07/27/2018 1029   K 3.7 07/27/2018 1029   CL 105 07/27/2018 1029   CO2 29 07/27/2018 1029   GLUCOSE 95 07/27/2018 1029   BUN 8 07/27/2018 1029   CREATININE 0.61 07/27/2018 1029   CREATININE 0.73 07/04/2018 0823   CALCIUM 9.4 07/27/2018 1029   PROT 6.4 07/27/2018 1029   ALBUMIN 3.9 07/27/2018 1029   AST 44 (H) 07/27/2018 1029   AST 53 (H) 07/04/2018 0823   ALT 30 07/27/2018 1029   ALT 36 07/04/2018 0823   ALKPHOS 113 07/27/2018 1029   BILITOT 1.6 (H) 07/27/2018 1029   BILITOT 2.2 (H) 07/04/2018 0823   GFRNONAA >60 07/09/2018 1404   GFRNONAA >60 07/04/2018 0823   GFRAA >60 07/09/2018 1404   GFRAA >60 07/04/2018 0823    No results found for: TOTALPROTELP, ALBUMINELP, A1GS, A2GS, BETS, BETA2SER, GAMS, MSPIKE, SPEI  No results found for: KPAFRELGTCHN, LAMBDASER, KAPLAMBRATIO  Lab Results  Component Value Date   WBC 5.0 07/27/2018   NEUTROABS 3.4 07/27/2018   HGB 14.9 07/27/2018   HCT 42.9 07/27/2018   MCV 101.6 (H) 07/27/2018   PLT 69.0 (L) 07/27/2018    @LASTCHEMISTRY @  No results found for: LABCA2  No components found for: VEHMCN470  No  results for input(s): INR in the last 168 hours.  No results found for: LABCA2  No results found for: JGG836  No results found for: OQH476  No results found for: LYY503  No results found for: CA2729  No components found for: HGQUANT  No results found for: CEA1 / No results found for: CEA1  No results found for: AFPTUMOR  No results found for: CHROMOGRNA  No results found for: PSA1  No visits with results within 3 Day(s) from this visit.  Latest known visit with results is:  Lab on 07/27/2018  Component Date Value Ref Range Status  . TSH 07/27/2018 0.41  0.35 - 4.50 uIU/mL Final  . Hgb A1c MFr Bld 07/27/2018 4.5* 4.6 - 6.5 % Final   Glycemic Control Guidelines for People with Diabetes:Non Diabetic:  <6%Goal of Therapy: <7%Additional Action Suggested:  >8%   . Cholesterol 07/27/2018 214* 0 - 200 mg/dL Final   ATP III Classification       Desirable:  < 200 mg/dL               Borderline High:  200 - 239 mg/dL          High:  > = 240 mg/dL  . Triglycerides 07/27/2018 69.0  0.0 - 149.0 mg/dL Final   Normal:  <150 mg/dLBorderline High:  150 - 199 mg/dL  . HDL 07/27/2018 67.10  >39.00 mg/dL Final  . VLDL 07/27/2018 13.8  0.0 - 40.0 mg/dL Final  . LDL Cholesterol 07/27/2018 133* 0 - 99 mg/dL Final  . Total CHOL/HDL Ratio 07/27/2018 3   Final                  Men          Women1/2 Average Risk     3.4          3.3Average Risk          5.0          4.42X Average Risk          9.6          7.13X Average Risk          15.0          11.0                      . NonHDL 07/27/2018 146.46   Final   NOTE:  Non-HDL goal should be 30 mg/dL higher than patient's LDL goal (i.e. LDL goal of < 70 mg/dL, would have non-HDL goal of < 100 mg/dL)  . Sodium 07/27/2018 142  135 - 145 mEq/L Final  . Potassium 07/27/2018 3.7  3.5 - 5.1 mEq/L Final  . Chloride 07/27/2018 105  96 - 112 mEq/L Final  . CO2 07/27/2018 29  19 - 32 mEq/L Final  . Glucose, Bld 07/27/2018 95  70 - 99 mg/dL Final  . BUN  07/27/2018 8  6 - 23 mg/dL Final  . Creatinine, Ser 07/27/2018 0.61  0.40 - 1.20 mg/dL Final  . Total Bilirubin 07/27/2018 1.6* 0.2 - 1.2 mg/dL Final  . Alkaline Phosphatase 07/27/2018 113  39 - 117 U/L Final  . AST 07/27/2018 44* 0 - 37 U/L Final  . ALT 07/27/2018 30  0 - 35 U/L Final  . Total Protein 07/27/2018 6.4  6.0 - 8.3 g/dL Final  . Albumin 07/27/2018 3.9  3.5 - 5.2 g/dL Final  . Calcium 07/27/2018 9.4  8.4 - 10.5 mg/dL Final  . GFR 07/27/2018 97.02  >60.00 mL/min Final  . WBC 07/27/2018 5.0  4.0 - 10.5 K/uL Final  . RBC 07/27/2018 4.22  3.87 - 5.11 Mil/uL Final  . Hemoglobin 07/27/2018 14.9  12.0 - 15.0 g/dL Final  . HCT 07/27/2018 42.9  36.0 - 46.0 % Final  . MCV 07/27/2018 101.6*  78.0 - 100.0 fl Final  . MCHC 07/27/2018 34.6  30.0 - 36.0 g/dL Final  . RDW 07/27/2018 13.2  11.5 - 15.5 % Final  . Platelets 07/27/2018 69.0* 150.0 - 400.0 K/uL Final  . Neutrophils Relative % 07/27/2018 67.2  43.0 - 77.0 % Final  . Lymphocytes Relative 07/27/2018 21.5  12.0 - 46.0 % Final  . Monocytes Relative 07/27/2018 6.7  3.0 - 12.0 % Final  . Eosinophils Relative 07/27/2018 4.1  0.0 - 5.0 % Final  . Basophils Relative 07/27/2018 0.5  0.0 - 3.0 % Final  . Neutro Abs 07/27/2018 3.4  1.4 - 7.7 K/uL Final  . Lymphs Abs 07/27/2018 1.1  0.7 - 4.0 K/uL Final  . Monocytes Absolute 07/27/2018 0.3  0.1 - 1.0 K/uL Final  . Eosinophils Absolute 07/27/2018 0.2  0.0 - 0.7 K/uL Final  . Basophils Absolute 07/27/2018 0.0  0.0 - 0.1 K/uL Final    (this displays the last labs from the last 3 days)  No results found for: TOTALPROTELP, ALBUMINELP, A1GS, A2GS, BETS, BETA2SER, GAMS, MSPIKE, SPEI (this displays SPEP labs)  No results found for: KPAFRELGTCHN, LAMBDASER, KAPLAMBRATIO (kappa/lambda light chains)  No results found for: HGBA, HGBA2QUANT, HGBFQUANT, HGBSQUAN (Hemoglobinopathy evaluation)   No results found for: LDH  No results found for: IRON, TIBC, IRONPCTSAT (Iron and TIBC)  Lab Results   Component Value Date   FERRITIN 171.6 10/25/2012    Urinalysis    Component Value Date/Time   BILIRUBINUR neg. 05/21/2013 1623   PROTEINUR neg. 05/21/2013 1623   UROBILINOGEN 0.2 05/21/2013 1623   NITRITE neg. 05/21/2013 1623   LEUKOCYTESUR small (1+) 05/21/2013 1623     STUDIES:  No results found.   ELIGIBLE FOR AVAILABLE RESEARCH PROTOCOL: no   ASSESSMENT: 68 y.o. [place], [place] woman s/p Left breast upper inner quadrant biopsy 06/20/2018 for a clinical T1b No, stage IA invasive ducatal carcinoma, grade 1, estrogen and progesterone receptor positive, HER-2 not amplified, with an Mib-1 of 5%  (1) status post left lumpectomy and sentinel lymph node sampling 07/12/2018 for a pT1b pN0, stage IA invasive ductal carcinoma, grade 1, with negative margins.  One lymph node was removed  (2) adjuvant radiation completed 09/07/2018  (3) to start anastrozole 10/19/2018  (a) bone density scan at [facility] 07/30/2015 shows a T score of -0.3   PLAN: Ekaterina is still recovering from her radiation.  We discussed activity level and using the radio Plex in addition to the vitamin E cream.  I think within 3 to 6 weeks she will be feeling much closer to normal.  We discussed antiestrogens in detail and we are going to give anastrozole a try.  She has a good understanding of the possible toxicities, side effects and complications of this agent.  She will started on 10/19/2018 to give her a little more time to recover from her recent treatments.  I will then see her sometime in August or September and if she is tolerating treatment well likely I will start seeing her on a once a year basis thereafter.  She will need a repeat bone density next year  She knows to call for any other issue that may develop before the next visit.  Selena Johnson, Selena Dad, MD  09/13/18 10:45 AM I, Lurline Del MD, have reviewed the above documentation for accuracy and completeness, and I agree with the above.  Medical  Oncology and Hematology [facility] [address] [place], [place] [address] Tel. [phone]  Fax. [phone]    I, Jacqualyn Posey am acting as a Education administrator for Chauncey Cruel, MD.   I, Lurline Del MD, have reviewed the above documentation for accuracy and completeness, and I agree with the above.

## 2018-09-19 ENCOUNTER — Telehealth: Payer: Self-pay | Admitting: Oncology

## 2018-09-19 NOTE — Telephone Encounter (Signed)
Called regarding upcoming appointments, patient does not have access to Webex and prefers this to a be a telephone visit.

## 2018-09-19 NOTE — Progress Notes (Signed)
[place]  [phone] [phone]     ID: Selena Johnson DOB: April 19, 1951  MR#: [id number]  TOI#:[id number]  Patient Care Team: Abner Greenspan, MD as PCP - General Vin-Parikh, Deirdre Peer, MD as Referring Physician (Ophthalmology) Glennie Isle, PA-C as Physician Assistant (Physician Assistant) Jannat Rosemeyer, Virgie Dad, MD as Consulting Physician (Oncology) Rolm Bookbinder, MD as Consulting Physician (General Surgery) Kyung Rudd, MD as Consulting Physician (Radiation Oncology) Druscilla Brownie, MD as Referring Physician (Dermatology) Mauro Kaufmann, RN as Oncology Nurse Navigator Rockwell Germany, RN as Oncology Nurse Navigator OTHER MD:    CHIEF COMPLAINT: Estrogen receptor positive breast cancer  CURRENT TREATMENT: Awaiting definitive surgery   I connected with Terese Door on 09/20/18 at 10:00 AM EDT by telephone visit and verified that I am speaking with the correct person using two identifiers.   I discussed the limitations, risks, security and privacy concerns of performing an evaluation and management service by telemedicine and the availability of in-person appointments. I also discussed with the patient that there may be a patient responsible charge related to this service. The patient expressed understanding and agreed to proceed.   Other persons participating in the visit and their role in the encounter: none   Patient's location: home  Provider's location: [place]   Chief Complaint: estrogen receptor positive breast cancer    HISTORY OF CURRENT ILLNESS: From the original intake note:  Selena Johnson underwent routine screening mammography at [facility] on 02/28/2018 showing an irregularity in the left breast. On 03/02/2018, she followed up with a unilateral left diagnostic mammogram and unilateral left ultrasound where a biopsy was recommended. A biopsy was performed on 03/07/2018. Pathology from the procedure  ([id number]) showed benign breast tissue with no specific histopathologic changes, negative for carcinoma. A three month follow up was recommended.  At her follow up, she underwent bilateral diagnostic mammography with tomography and left breast ultrasonography at [facility] on 06/12/2018 showing: Breast Density Category C. There os a 0.7 cm oval mass in the left breast at 9 o'clock posterior depth 9 cm from the nipple. There is a questioned mild architectural distortion associated with the mass. Biopsy clip is noted 1 cm medial and 1.7 cm inferior to the mass. No other significant masses or calcifications are seen in the breast. On ultrasound, there is a 0.9 x 0.7 cm oval mass with an angular margin in the left breast at 9:30 o'clock 8 cm from the nipple. Color flow imaging demonstrates that there is vascularity present. Elastography imaging assessment is soft. No definite biopsy clip is associated with this mass. This mass corresponds to the mammographic mass. No significant abnormalities were seen sonographically in the left axilla.   Accordingly on 06/20/2018 she proceeded to biopsy of the left breast area in question. The pathology from this procedure showed ([id number]): invasive ductal carcinoma, grade I. Prognostic indicators significant for: estrogen receptor, 100% positive and progesterone receptor, 20% positive, both with strong staining intensity. Proliferation marker Ki67 at 3%. HER2 negative (1+) by immunohistochemistry.  The patient's subsequent history is as detailed below.   INTERVAL HISTORY: [name] returns today for follow-up and treatment of her estrogen receptor positive breast cancer.  Since her last visit here, she underwent a left lumpectomy on 07/12/2018. The pathology from this procedure showed ([id number]): 1. Breast, lumpectomy, left w/seed - invasive ductal carcinoma, 0.8 cm, grade I. - ductal carcinoma in situ, intermediate nuclear grade. - carcinoma is focally less than 1 mm  from the  anterior margin and 7 mm from superior and posterior margins. - negative for lymphovascular or perineural invasion. - biopsy site changes. - see oncology table. 2. Lymph node, sentinel, biopsy, left axillary - lymph node, negative for carcinoma (0/1). 3. Breast, excision, left medial margin   - benign breast parenchyma  She also completed radiation treatment on 09/07/2018.   She is not ready to consider antiestrogens  REVIEW OF SYSTEMS: Horris Latino   PAST MEDICAL HISTORY: Past Medical History:  Diagnosis Date  . Allergy   . Cancer ([place])    basal cell skin CA  . Chronic kidney disease    hx of kidney stones  . Conjunctivitis    chronic  . Fatty liver 2015  . GERD (gastroesophageal reflux disease)   . Glaucoma   . Hearing loss in left ear   . History of kidney stones   . Hyperlipidemia    no per pt  . Hypertension   . Menopausal syndrome   . NASH (nonalcoholic steatohepatitis)    with cirrhosis and mild esoph varicies  . Primary hypothyroidism      PAST SURGICAL HISTORY: Past Surgical History:  Procedure Laterality Date  . ABDOMINAL HYSTERECTOMY     partial ? prolapse  . BREAST LUMPECTOMY WITH RADIOACTIVE SEED AND SENTINEL LYMPH NODE BIOPSY Left 07/12/2018   Procedure: LEFT BREAST LUMPECTOMY WITH RADIOACTIVE SEED AND LEFT AXILLARY SENTINEL LYMPH NODE BIOPSY;  Surgeon: Rolm Bookbinder, MD;  Location: [facility];  Service: General;  Laterality: Left;  . CHOLECYSTECTOMY    . ESOPHAGOGASTRODUODENOSCOPY    . TONSILLECTOMY       FAMILY HISTORY: Family History  Problem Relation Age of Onset  . Heart disease Mother        CAD  . Hypertension Mother   . Glaucoma Mother   . Heart disease Father        CAD  . Diabetes Father   . Hypertension Father   . Cancer Father        ? CA - squamous cell carcinoma  . Colon cancer Neg Hx   . Esophageal cancer Neg Hx   . Rectal cancer Neg Hx   . Stomach cancer Neg Hx    Bonne's father died from squamous cell cancer of  the lung at age 76. Patients' mother died from stroke and sepsis at age 79. The patient has 1 brother and 1 sister. Her brother has advanced renal cell CA and is being treated at [facility]. Patient denies anyone in her family having breast, ovarian, prostate, or pancreatic cancer.    GYNECOLOGIC HISTORY:  No LMP recorded. Patient has had a hysterectomy. 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 She has no birth children and one deceased step-child. [place] has no grandchildren. She attends a [employer].   ADVANCED DIRECTIVES: Her husband, Nadara Mustard.   HEALTH MAINTENANCE: Social History   Tobacco Use  . Smoking status: Former Smoker    Last attempt to quit: 06/06/1982    Years since quitting: 36.3  . Smokeless tobacco: Never Used  Substance Use Topics  . Alcohol use: Yes    Alcohol/week: 0.0 standard drinks    Comment: wine- rare  . Drug use: No    Colonoscopy: yes, Nandigam  PAP:   Bone density: 07/30/2015 at [facility], score -  0.3   Allergies  Allergen Reactions  . Codeine     REACTION: Throat swelling  . Ivp Dye [Iodinated Diagnostic Agents] Swelling    Current Outpatient Medications  Medication Sig Dispense Refill  . anastrozole (ARIMIDEX) 1 MG tablet Take 1 tablet (1 mg total) by mouth daily. 90 tablet 4  . Biotin 2500 MCG CAPS Take 1 capsule by mouth daily. 5,000 mcg    . Cholecalciferol (VITAMIN D3) 2000 units TABS Take 1 tablet by mouth daily.    Marland Kitchen levothyroxine (SYNTHROID, LEVOTHROID) 175 MCG tablet Take 1 tablet (175 mcg total) by mouth daily before breakfast. 90 tablet 3  . loratadine (CLARITIN) 10 MG tablet Take 10 mg by mouth daily as needed.      . metoprolol succinate (TOPROL-XL) 100 MG 24 hr tablet Take with or immediately  following a meal. 90 tablet 3  . Multiple Vitamin (MULTIVITAMIN) capsule Take 1 capsule by mouth daily.      Marland Kitchen oxyCODONE (OXY IR/ROXICODONE) 5 MG immediate release tablet Take 1 tablet (5 mg total) by mouth every 6 (six) hours as needed for moderate pain, severe pain or breakthrough pain. 10 tablet 0  . potassium chloride SA (K-DUR,KLOR-CON) 20 MEQ tablet Take 1 tablet by mouth every day 90 tablet 3  . triamterene-hydrochlorothiazide (MAXZIDE-25) 37.5-25 MG tablet Take 1 tablet by mouth daily. 90 tablet 3   No current facility-administered medications for this visit.      OBJECTIVE: Middle-aged white woman   There were no vitals filed for this visit.   There is no height or weight on file to calculate BMI.   Wt Readings from Last 3 Encounters:  08/03/18 213 lb 6 oz (96.8 kg)  07/26/18 214 lb 9.6 oz (97.3 kg)  07/12/18 211 lb (95.7 kg)      ECOG FS:1 - Symptomatic but completely ambulatory   LAB RESULTS:  CMP     Component Value Date/Time   NA 142 07/27/2018 1029   K 3.7 07/27/2018 1029   CL 105 07/27/2018 1029   CO2 29 07/27/2018 1029   GLUCOSE 95 07/27/2018 1029   BUN 8 07/27/2018 1029   CREATININE 0.61 07/27/2018 1029   CREATININE 0.73 07/04/2018 0823   CALCIUM 9.4 07/27/2018 1029   PROT 6.4 07/27/2018 1029   ALBUMIN 3.9 07/27/2018 1029   AST 44 (H) 07/27/2018 1029   AST 53 (H) 07/04/2018 0823   ALT 30 07/27/2018 1029   ALT 36 07/04/2018 0823   ALKPHOS 113 07/27/2018 1029   BILITOT 1.6 (H) 07/27/2018 1029   BILITOT 2.2 (H) 07/04/2018 0823   GFRNONAA >60 07/09/2018 1404   GFRNONAA >60 07/04/2018 0823   GFRAA >60 07/09/2018 1404   GFRAA >60 07/04/2018 0823    No results found for: TOTALPROTELP, ALBUMINELP, A1GS, A2GS, BETS, BETA2SER, GAMS, MSPIKE, SPEI  No results found for: KPAFRELGTCHN, LAMBDASER, KAPLAMBRATIO  Lab Results  Component Value Date   WBC 5.0 07/27/2018   NEUTROABS 3.4 07/27/2018   HGB 14.9 07/27/2018   HCT 42.9 07/27/2018   MCV 101.6 (H)  07/27/2018   PLT 69.0 (L) 07/27/2018    _0 @  No results found for: LABCA2  No components found for: ZOXWRU045  No results for input(s): INR in the last 168 hours.  No results found for: LABCA2  No results found for: WUJ811  No results found for: BJY782  No results found for: NFA213  No results found for: CA2729  No components found for: HGQUANT  No results found for: CEA1 /  No results found for: CEA1   No results found for: AFPTUMOR  No results found for: CHROMOGRNA  No results found for: PSA1  No visits with results within 3 Day(s) from this visit.  Latest known visit with results is:  Lab on 07/27/2018  Component Date Value Ref Range Status  . TSH 07/27/2018 0.41  0.35 - 4.50 uIU/mL Final  . Hgb A1c MFr Bld 07/27/2018 4.5* 4.6 - 6.5 % Final   Glycemic Control Guidelines for People with Diabetes:Non Diabetic:  <6%Goal of Therapy: <7%Additional Action Suggested:  >8%   . Cholesterol 07/27/2018 214* 0 - 200 mg/dL Final   ATP III Classification       Desirable:  < 200 mg/dL               Borderline High:  200 - 239 mg/dL          High:  > = 240 mg/dL  . Triglycerides 07/27/2018 69.0  0.0 - 149.0 mg/dL Final   Normal:  <150 mg/dLBorderline High:  150 - 199 mg/dL  . HDL 07/27/2018 67.10  >39.00 mg/dL Final  . VLDL 07/27/2018 13.8  0.0 - 40.0 mg/dL Final  . LDL Cholesterol 07/27/2018 133* 0 - 99 mg/dL Final  . Total CHOL/HDL Ratio 07/27/2018 3   Final                  Men          Women1/2 Average Risk     3.4          3.3Average Risk          5.0          4.42X Average Risk          9.6          7.13X Average Risk          15.0          11.0                      . NonHDL 07/27/2018 146.46   Final   NOTE:  Non-HDL goal should be 30 mg/dL higher than patient's LDL goal (i.e. LDL goal of < 70 mg/dL, would have non-HDL goal of < 100 mg/dL)  . Sodium 07/27/2018 142  135 - 145 mEq/L Final  . Potassium 07/27/2018 3.7  3.5 - 5.1 mEq/L Final  . Chloride 07/27/2018  105  96 - 112 mEq/L Final  . CO2 07/27/2018 29  19 - 32 mEq/L Final  . Glucose, Bld 07/27/2018 95  70 - 99 mg/dL Final  . BUN 07/27/2018 8  6 - 23 mg/dL Final  . Creatinine, Ser 07/27/2018 0.61  0.40 - 1.20 mg/dL Final  . Total Bilirubin 07/27/2018 1.6* 0.2 - 1.2 mg/dL Final  . Alkaline Phosphatase 07/27/2018 113  39 - 117 U/L Final  . AST 07/27/2018 44* 0 - 37 U/L Final  . ALT 07/27/2018 30  0 - 35 U/L Final  . Total Protein 07/27/2018 6.4  6.0 - 8.3 g/dL Final  . Albumin 07/27/2018 3.9  3.5 - 5.2 g/dL Final  . Calcium 07/27/2018 9.4  8.4 - 10.5 mg/dL Final  . GFR 07/27/2018 97.02  >60.00 mL/min Final  . WBC 07/27/2018 5.0  4.0 - 10.5 K/uL Final  . RBC 07/27/2018 4.22  3.87 - 5.11 Mil/uL Final  . Hemoglobin 07/27/2018 14.9  12.0 - 15.0 g/dL Final  . HCT 07/27/2018 42.9  36.0 - 46.0 %  Final  . MCV 07/27/2018 101.6* 78.0 - 100.0 fl Final  . MCHC 07/27/2018 34.6  30.0 - 36.0 g/dL Final  . RDW 07/27/2018 13.2  11.5 - 15.5 % Final  . Platelets 07/27/2018 69.0* 150.0 - 400.0 K/uL Final  . Neutrophils Relative % 07/27/2018 67.2  43.0 - 77.0 % Final  . Lymphocytes Relative 07/27/2018 21.5  12.0 - 46.0 % Final  . Monocytes Relative 07/27/2018 6.7  3.0 - 12.0 % Final  . Eosinophils Relative 07/27/2018 4.1  0.0 - 5.0 % Final  . Basophils Relative 07/27/2018 0.5  0.0 - 3.0 % Final  . Neutro Abs 07/27/2018 3.4  1.4 - 7.7 K/uL Final  . Lymphs Abs 07/27/2018 1.1  0.7 - 4.0 K/uL Final  . Monocytes Absolute 07/27/2018 0.3  0.1 - 1.0 K/uL Final  . Eosinophils Absolute 07/27/2018 0.2  0.0 - 0.7 K/uL Final  . Basophils Absolute 07/27/2018 0.0  0.0 - 0.1 K/uL Final    (this displays the last labs from the last 3 days)  No results found for: TOTALPROTELP, ALBUMINELP, A1GS, A2GS, BETS, BETA2SER, GAMS, MSPIKE, SPEI (this displays SPEP labs)  No results found for: KPAFRELGTCHN, LAMBDASER, KAPLAMBRATIO (kappa/lambda light chains)  No results found for: HGBA, HGBA2QUANT, HGBFQUANT, HGBSQUAN  (Hemoglobinopathy evaluation)   No results found for: LDH  No results found for: IRON, TIBC, IRONPCTSAT (Iron and TIBC)  Lab Results  Component Value Date   FERRITIN 171.6 10/25/2012    Urinalysis    Component Value Date/Time   BILIRUBINUR neg. 05/21/2013 1623   PROTEINUR neg. 05/21/2013 1623   UROBILINOGEN 0.2 05/21/2013 1623   NITRITE neg. 05/21/2013 1623   LEUKOCYTESUR small (1+) 05/21/2013 1623     STUDIES:  No results found.    ELIGIBLE FOR AVAILABLE RESEARCH PROTOCOL: no   ASSESSMENT: 68 y.o. [place], [place] woman s/p Left breast upper inner quadrant biopsy 06/20/2018 for a clinical T1b No, stage IA invasive ducatal carcinoma, grade 1, estrogen and progesterone receptor positive, HER-2 not amplified, with an Mib-1 of 5%  (1) status post left lumpectomy and sentinel lymph node sampling 07/12/2018 for a pT1b pN0, stage IA invasive ductal carcinoma, grade 1, with close but negative margins.  (a) a total of 2 sentinel lymph nodes were removed  (2) adjuvant radiation completed 09/07/2018  (3) anastrozole to start 10/19/2018  (a) bone density scan at [facility] 07/30/2015 shows a T score of -0.3   PLAN: Seba has now completed the local treatment portion of her breast cancer therapy, namely treatment of the breast alone.  She did well with the surgery.  She is still having significant issues from the radiation including some desquamation and fatigue.  These will continue to improve over the next 2 to 8 weeks.  She is not ready to discuss antiestrogens.  She understands this will be the only type of systemic therapy she needs and that she will have a very good prognosis without chemotherapy.  We specifically discussed anastrozole and she has a good understanding of the possible toxicities, side effects and complications of this agent.  I think she will be ready to start mid May and I have gone ahead and placed the prescription in for her to start it then.  She will let  me know if cost is an issue.  Otherwise she will return to see me sometime in August.  If she tolerates anastrozole well the plan will be likely for yearly visits for the next 5 years  She knows to call for  any other issue that may develop before then.   Lem Peary, Virgie Dad, MD  09/20/18 10:51 AM Medical Oncology and Hematology [facility] [address] [place], [place] [address] Tel. [phone]    Fax. [phone]   I, Jacqualyn Posey am acting as a Education administrator for Chauncey Cruel, MD.   I, Lurline Del MD, have reviewed the above documentation for accuracy and completeness, and I agree with the above.

## 2018-09-20 ENCOUNTER — Other Ambulatory Visit: Payer: Medicare Other

## 2018-09-20 ENCOUNTER — Inpatient Hospital Stay: Payer: Medicare Other | Attending: Oncology | Admitting: Oncology

## 2018-09-20 DIAGNOSIS — C50212 Malignant neoplasm of upper-inner quadrant of left female breast: Secondary | ICD-10-CM

## 2018-09-20 DIAGNOSIS — Z170 Estrogen receptor positive status [ER+]: Secondary | ICD-10-CM | POA: Diagnosis not present

## 2018-09-20 MED ORDER — ANASTROZOLE 1 MG PO TABS: 1.0000 mg | ORAL_TABLET | Freq: Every day | ORAL | 4 refills | Status: DC

## 2018-10-10 ENCOUNTER — Telehealth: Payer: Self-pay | Admitting: Radiation Oncology

## 2018-10-10 NOTE — Telephone Encounter (Signed)
  Radiation Oncology         [phone] ________________________________  Name: Selena Johnson MRN: [id number]  Date of Service: 10/10/2018  DOB: [birth year]  Post Treatment Telephone Note  Diagnosis:   Stage IA, pT1bN0M0, Grade 1, ER/PR positive, invasive ductal carcinoma of the left breast.  Interval Since Last Radiation: 5 weeks   08/13/18-09/07/18:  The left breast was treated to 42.56 Gy in 16 fractions and the lumpectomy cavity received an 8 Gy boost in 4 fractions to total 50.56 Gy.  Narrative:  The patient was contacted today for routine follow-up. During treatment she did very well with radiotherapy and did not have significant desquamation. She reports she is doing well without concerns about her skin. She is going to start Arimidex in about a week and a half. She reports her fingernails have been peeling and she is unsure the nature of this. She had stable thyroid testing in February and continues her synthroid. She did not receive chemotherapy. No other complaints are verbalized.  Impression/Plan: 1. Stage IA, pT1bN0M0, Grade 1, ER/PR positive, invasive ductal carcinoma of the left breast. The patient has been doing well since completion of radiotherapy. We discussed that we would be happy to continue to follow her as needed, but she will also continue to follow up with Dr. Jana Hakim in medical oncology. She was counseled on skin care as well as measures to avoid sun exposure to this area.  2. Survivorship. We discussed the importance of survivorship evaluation and was given the phone number for Ottis Stain [phone]) to be added to the list to receive the monthly resource calendar for the cancer center.  3. Peeling fingernails. I encouraged her to follow up with her PCP regarding this as this would not likely be due to her prior cancer treatments.     Carola Rhine, PAC

## 2018-10-11 NOTE — Progress Notes (Signed)
  Radiation Oncology         [phone] ________________________________  Name: Selena Johnson MRN: [id number]  Date: 09/07/2018  DOB: [birth year]  End of Treatment Note  Diagnosis:   68 y.o. female with Stage IA, pT1bN0M0, Grade 1, ER/PR positive, invasive ductal carcinoma of the left breast  Indication for treatment:  Curative       Radiation treatment dates:   08/13/2018 - 09/07/2018  Site/dose:   The patient initially received a dose of 42.56 Gy in 16 fractions to the left breast using whole-breast tangent fields. This was delivered using a 3-D conformal technique. The patient then received a boost to the seroma. This delivered an additional 8 Gy in 4 fractions using a 3 field photon technique due to the depth of the seroma. The total dose was 50.56 Gy.  Beams/energy:   3D / 6X, 15X Photon  Narrative: The patient tolerated radiation treatment relatively well.   The patient had some expected skin irritation with moderate erythema as she progressed during treatment. Moist desquamation was not present at the end of treatment. She is using her Radiaplex gel as ordered. She also noted mild fatigue.  Plan: The patient has completed radiation treatment. The patient will return to radiation oncology clinic for routine followup in one month. I advised the patient to call or return sooner if they have any questions or concerns related to their recovery or treatment. ________________________________  Jodelle Gross, MD, PhD  This document serves as a record of services personally performed by Kyung Rudd, MD. It was created on his behalf by Rae Lips, a trained medical scribe. The creation of this record is based on the scribe's personal observations and the provider's statements to them. This document has been checked and approved by the attending provider.

## 2018-11-09 ENCOUNTER — Encounter: Payer: Self-pay | Admitting: *Deleted

## 2018-11-09 ENCOUNTER — Telehealth: Payer: Self-pay | Admitting: Oncology

## 2018-11-09 NOTE — Telephone Encounter (Signed)
Scheduled appt per 6/5 sch message - pt to get an updated schedule next visit.

## 2019-01-17 DIAGNOSIS — H40033 Anatomical narrow angle, bilateral: Secondary | ICD-10-CM | POA: Diagnosis not present

## 2019-02-02 NOTE — Progress Notes (Signed)
[place]  [phone] Fax:([phone]     ID: [name] DOB: 1950/10/20  MR#: [id number]  LMB#:[id number]  Patient Care Team: Abner Greenspan, MD as PCP - General Vin-Parikh, Deirdre Peer, MD as Referring Physician (Ophthalmology) Glennie Isle, PA-C as Physician Assistant (Physician Assistant) Magrinat, Virgie Dad, MD as Consulting Physician (Oncology) Rolm Bookbinder, MD as Consulting Physician (General Surgery) Kyung Rudd, MD as Consulting Physician (Radiation Oncology) Druscilla Brownie, MD as Referring Physician (Dermatology) Mauro Kaufmann, RN as Oncology Nurse Navigator Rockwell Germany, RN as Oncology Nurse Navigator OTHER MD:    CHIEF COMPLAINT: Estrogen receptor positive breast cancer  CURRENT TREATMENT: Anastrozole   HISTORY OF CURRENT ILLNESS: From the original intake note:  Selena Johnson underwent routine screening mammography at [facility] on 02/28/2018 showing an irregularity in the left breast. On 03/02/2018, she followed up with a unilateral left diagnostic mammogram and unilateral left ultrasound where a biopsy was recommended. A biopsy was performed on 03/07/2018. Pathology from the procedure ([id number]) showed benign breast tissue with no specific histopathologic changes, negative for carcinoma. A three month follow up was recommended.  At her follow up, she underwent bilateral diagnostic mammography with tomography and left breast ultrasonography at [facility] on 06/12/2018 showing: Breast Density Category C. There os a 0.7 cm oval mass in the left breast at 9 o'clock posterior depth 9 cm from the nipple. There is a questioned mild architectural distortion associated with the mass. Biopsy clip is noted 1 cm medial and 1.7 cm inferior to the mass. No other significant masses or calcifications are seen in the breast. On ultrasound, there is a 0.9 x 0.7 cm oval mass with an angular margin in the left breast at 9:30 o'clock 8 cm from the  nipple. Color flow imaging demonstrates that there is vascularity present. Elastography imaging assessment is soft. No definite biopsy clip is associated with this mass. This mass corresponds to the mammographic mass. No significant abnormalities were seen sonographically in the left axilla.   Accordingly on 06/20/2018 she proceeded to biopsy of the left breast area in question. The pathology from this procedure showed ([id number]): invasive ductal carcinoma, grade I. Prognostic indicators significant for: estrogen receptor, 100% positive and progesterone receptor, 20% positive, both with strong staining intensity. Proliferation marker Ki67 at 3%. HER2 negative (1+) by immunohistochemistry.  The patient's subsequent history is as detailed below.   INTERVAL HISTORY: [name] returns today for follow-up and treatment of her estrogen receptor positive breast cancer. She was last seen here on 09/19/2018.   She started on anastrozole about 3 months ago.  She is tolerating this with no unusual side effects.  She does have hot flashes chiefly at night.  She does not have them during the day.  Vaginal dryness is not an issue.  She has had no arthralgias or myalgias.  Selena Johnson's last bone density screening on 07/30/2015, showed a T-score of -0.3, which is considered normal.    Since her last visit here, she has not undergone any additional studies. She will be due for mammography on or after 03/01/2019.    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  She still feels she has not recovered her energy from before radiation.  Detailed review of systems today was otherwise stable    PAST MEDICAL HISTORY: Past Medical History:  Diagnosis Date   Allergy  Cancer (HCC)    basal cell skin CA   Chronic kidney disease    hx of kidney stones   Conjunctivitis    chronic   Fatty liver 2015    GERD (gastroesophageal reflux disease)    Glaucoma    Hearing loss in left ear    History of kidney stones    Hyperlipidemia    no per pt   Hypertension    Menopausal syndrome    NASH (nonalcoholic steatohepatitis)    with cirrhosis and mild esoph varicies   Primary hypothyroidism      PAST SURGICAL HISTORY: Past Surgical History:  Procedure Laterality Date   ABDOMINAL HYSTERECTOMY     partial ? prolapse   BREAST LUMPECTOMY WITH RADIOACTIVE SEED AND SENTINEL LYMPH NODE BIOPSY Left 07/12/2018   Procedure: LEFT BREAST LUMPECTOMY WITH RADIOACTIVE SEED AND LEFT AXILLARY SENTINEL LYMPH NODE BIOPSY;  Surgeon: Rolm Bookbinder, MD;  Location: [facility] OR;  Service: General;  Laterality: Left;   CHOLECYSTECTOMY     ESOPHAGOGASTRODUODENOSCOPY     TONSILLECTOMY       FAMILY HISTORY: Family History  Problem Relation Age of Onset   Heart disease Mother        CAD   Hypertension Mother    Glaucoma Mother    Heart disease Father        CAD   Diabetes Father    Hypertension Father    Cancer Father        ? CA - squamous cell carcinoma   Colon cancer Neg Hx    Esophageal cancer Neg Hx    Rectal cancer Neg Hx    Stomach cancer Neg Hx    Shyrl's father died from squamous cell cancer of the lung at age 43. Patients' mother died from stroke and sepsis at age 23. The patient has 1 brother and 1 sister. Her brother has advanced renal cell CA and is being treated at [facility]. Patient denies anyone in her family having breast, ovarian, prostate, or pancreatic cancer.    GYNECOLOGIC HISTORY:  No LMP recorded. Patient has had a hysterectomy. 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 She has no birth children and one deceased step-child.  Selena Johnson has no grandchildren. She attends a [employer].   ADVANCED DIRECTIVES: Her husband, Selena Johnson.   HEALTH MAINTENANCE: Social History   Tobacco Use   Smoking status: Former Smoker    Quit date: 06/06/1982    Years since quitting: 36.6   Smokeless tobacco: Never Used  Substance Use Topics   Alcohol use: Yes    Alcohol/week: 0.0 standard drinks    Comment: wine- rare   Drug use: No    Colonoscopy: yes, Nandigam  PAP:   Bone density: 07/30/2015 at [facility], score -0.3   Allergies  Allergen Reactions   Codeine     REACTION: Throat swelling   Ivp Dye [Iodinated Diagnostic Agents] Swelling    Current Outpatient Medications  Medication Sig Dispense Refill   anastrozole (ARIMIDEX) 1 MG tablet Take 1 tablet (1 mg total) by mouth daily. 90 tablet 4   Biotin 2500 MCG CAPS Take 1 capsule by mouth daily. 5,000 mcg     Cholecalciferol (VITAMIN D3) 2000 units TABS Take 1 tablet by mouth daily.     levothyroxine (SYNTHROID,  LEVOTHROID) 175 MCG tablet Take 1 tablet (175 mcg total) by mouth daily before breakfast. 90 tablet 3   loratadine (CLARITIN) 10 MG tablet Take 10 mg by mouth daily as needed.       metoprolol succinate (TOPROL-XL) 100 MG 24 hr tablet Take with or immediately following a meal. 90 tablet 3   Multiple Vitamin (MULTIVITAMIN) capsule Take 1 capsule by mouth daily.       oxyCODONE (OXY IR/ROXICODONE) 5 MG immediate release tablet Take 1 tablet (5 mg total) by mouth every 6 (six) hours as needed for moderate pain, severe pain or breakthrough pain. 10 tablet 0   potassium chloride SA (K-DUR,KLOR-CON) 20 MEQ tablet Take 1 tablet by mouth every day 90 tablet 3   triamterene-hydrochlorothiazide (MAXZIDE-25) 37.5-25 MG tablet Take 1 tablet by mouth daily. 90 tablet 3   No current facility-administered medications for this visit.      OBJECTIVE: Middle-aged white woman who appears stated age  29:   02/04/19 1415  BP: (!) 149/70  Pulse: 73  Resp:  18  Temp: 98.5 F (36.9 C)  SpO2: 96%   Wt Readings from Last 3 Encounters:  02/04/19 214 lb 3.2 oz (97.2 kg)  08/03/18 213 lb 6 oz (96.8 kg)  07/26/18 214 lb 9.6 oz (97.3 kg)   Body mass index is 35.64 kg/m.    ECOG FS:1 - Symptomatic but completely ambulatory  Ocular: Sclerae unicteric, pupils round and equal Ear-nose-throat: Wearing a mask Lymphatic: No cervical or supraclavicular adenopathy Lungs no rales or rhonchi Heart regular rate and rhythm Abd soft, nontender, positive bowel sounds MSK no focal spinal tenderness, no joint edema Neuro: non-focal, well-oriented, appropriate affect Breasts: The right breast is benign.  The left breast is status post lumpectomy and radiation, with no evidence of disease recurrence.  Both axillae are benign   LAB RESULTS:  CMP     Component Value Date/Time   NA 139 02/04/2019 1330   K 3.6 02/04/2019 1330   CL 107 02/04/2019 1330   CO2 23 02/04/2019 1330   GLUCOSE 117 (H) 02/04/2019 1330   BUN 9 02/04/2019 1330   CREATININE 0.66 02/04/2019 1330   CREATININE 0.73 07/04/2018 0823   CALCIUM 9.2 02/04/2019 1330   PROT 6.4 (L) 02/04/2019 1330   ALBUMIN 3.7 02/04/2019 1330   AST 50 (H) 02/04/2019 1330   AST 53 (H) 07/04/2018 0823   ALT 31 02/04/2019 1330   ALT 36 07/04/2018 0823   ALKPHOS 134 (H) 02/04/2019 1330   BILITOT 1.5 (H) 02/04/2019 1330   BILITOT 2.2 (H) 07/04/2018 0823   GFRNONAA >60 02/04/2019 1330   GFRNONAA >60 07/04/2018 0823   GFRAA >60 02/04/2019 1330   GFRAA >60 07/04/2018 0823    No results found for: TOTALPROTELP, ALBUMINELP, A1GS, A2GS, BETS, BETA2SER, GAMS, MSPIKE, SPEI  No results found for: KPAFRELGTCHN, LAMBDASER, KAPLAMBRATIO  Lab Results  Component Value Date   WBC 4.8 02/04/2019   NEUTROABS 3.3 02/04/2019   HGB 14.1 02/04/2019   HCT 40.0 02/04/2019   MCV 99.8 02/04/2019   PLT 70 (L) 02/04/2019    _0 @  No results found for: LABCA2  No components found for: FIEPPI951  No  results for input(s): INR in the last 168 hours.  No results found for: LABCA2  No results found for: OAC166  No results found for: AYT016  No results found for: WFU932  No results found for: CA2729  No components found for: HGQUANT  No results found for: CEA1 /  No results found for: CEA1   No results found for: AFPTUMOR  No results found for: [place]  No results found for: PSA1  Appointment on 02/04/2019  Component Date Value Ref Range Status   Sodium 02/04/2019 139  135 - 145 mmol/L Final   Potassium 02/04/2019 3.6  3.5 - 5.1 mmol/L Final   Chloride 02/04/2019 107  98 - 111 mmol/L Final   CO2 02/04/2019 23  22 - 32 mmol/L Final   Glucose, Bld 02/04/2019 117* 70 - 99 mg/dL Final   BUN 02/04/2019 9  8 - 23 mg/dL Final   Creatinine, Ser 02/04/2019 0.66  0.44 - 1.00 mg/dL Final   Calcium 02/04/2019 9.2  8.9 - 10.3 mg/dL Final   Total Protein 02/04/2019 6.4* 6.5 - 8.1 g/dL Final   Albumin 02/04/2019 3.7  3.5 - 5.0 g/dL Final   AST 02/04/2019 50* 15 - 41 U/L Final   ALT 02/04/2019 31  0 - 44 U/L Final   Alkaline Phosphatase 02/04/2019 134* 38 - 126 U/L Final   Total Bilirubin 02/04/2019 1.5* 0.3 - 1.2 mg/dL Final   GFR calc non Af Amer 02/04/2019 >60  >60 mL/min Final   GFR calc Af Amer 02/04/2019 >60  >60 mL/min Final   Anion gap 02/04/2019 9  5 - 15 Final   Performed at [facility], [place] [address], [place], [place] [address]   WBC 02/04/2019 4.8  4.0 - 10.5 K/uL Final   RBC 02/04/2019 4.01  3.87 - 5.11 MIL/uL Final   Hemoglobin 02/04/2019 14.1  12.0 - 15.0 g/dL Final   HCT 02/04/2019 40.0  36.0 - 46.0 % Final   MCV 02/04/2019 99.8  80.0 - 100.0 fL Final   MCH 02/04/2019 35.2* 26.0 - 34.0 pg Final   MCHC 02/04/2019 35.3  30.0 - 36.0 g/dL Final   RDW 02/04/2019 12.8  11.5 - 15.5 % Final   Platelets 02/04/2019 70* 150 - 400 K/uL Final   nRBC 02/04/2019 0.0  0.0 - 0.2 % Final   Neutrophils Relative % 02/04/2019 69   % Final   Neutro Abs 02/04/2019 3.3  1.7 - 7.7 K/uL Final   Lymphocytes Relative 02/04/2019 17  % Final   Lymphs Abs 02/04/2019 0.8  0.7 - 4.0 K/uL Final   Monocytes Relative 02/04/2019 7  % Final   Monocytes Absolute 02/04/2019 0.4  0.1 - 1.0 K/uL Final   Eosinophils Relative 02/04/2019 6  % Final   Eosinophils Absolute 02/04/2019 0.3  0.0 - 0.5 K/uL Final   Basophils Relative 02/04/2019 1  % Final   Basophils Absolute 02/04/2019 0.0  0.0 - 0.1 K/uL Final   Immature Granulocytes 02/04/2019 0  % Final   Abs Immature Granulocytes 02/04/2019 0.01  0.00 - 0.07 K/uL Final   Performed at [facility], [place] [address], [place], [place] [address]    (this displays the last labs from the last 3 days)  No results found for: TOTALPROTELP, ALBUMINELP, A1GS, A2GS, BETS, BETA2SER, GAMS, MSPIKE, SPEI (this displays SPEP labs)  No results found for: KPAFRELGTCHN, LAMBDASER, KAPLAMBRATIO (kappa/lambda light chains)  No results found for: HGBA, HGBA2QUANT, HGBFQUANT, HGBSQUAN (Hemoglobinopathy evaluation)   No results found for: LDH  No results found for: IRON, TIBC, IRONPCTSAT (Iron and TIBC)  Lab Results  Component Value Date   FERRITIN 171.6 10/25/2012    Urinalysis    Component Value Date/Time   BILIRUBINUR neg. 05/21/2013 1623   PROTEINUR neg. 05/21/2013 1623  UROBILINOGEN 0.2 05/21/2013 1623   NITRITE neg. 05/21/2013 1623   LEUKOCYTESUR small (1+) 05/21/2013 1623     STUDIES:  No results found.    ELIGIBLE FOR AVAILABLE RESEARCH PROTOCOL: no   ASSESSMENT: 68 y.o. Fernand Parkins, [place] woman s/p Left breast upper inner quadrant biopsy 06/20/2018 for a clinical T1b No, stage IA invasive ducatal carcinoma, grade 1, estrogen and progesterone receptor positive, HER-2 not amplified, with an Mib-1 of 5%  (1) status post left lumpectomy and sentinel lymph node sampling 07/12/2018 for a pT1b pN0, stage IA invasive ductal carcinoma, grade 1, with close  but negative margins.  (a) a total of 2 sentinel lymph nodes were removed  (2) adjuvant radiation03/02/2019 - 09/07/2018 Site/dose:   The patient initially received a dose of 42.56 Gy in 16 fractions to the left breast using whole-breast tangent fields. This was delivered using a 3-D conformal technique. The patient then received a boost to the seroma. This delivered an additional 8 Gy in 4 fractions using a 3 field photon technique due to the depth of the seroma. The total dose was 50.56 Gy.   (3) anastrozole started 10/19/2018  (a) bone density scan at [facility] 07/30/2015 shows a T score of -0.3  (b) bone density September 2020   PLAN: Victorian is now 6 months out from definitive surgery for her breast cancer.  She has started anastrozole and is tolerating it well except for nighttime hot flashes.  She will be scheduled for repeat mammography in September and she will have a bone density at the same time.  We obtained a vitamin D level today, and encouraged her to continue her walking program.  For the nighttime hot flashes side offered her gabapentin and she has a good understanding of the possible toxicities side effects and complications of this agent.  At this time she would prefer to not receive additional medications but she knows that option is there for her.  She will return to see me in 6 months.  She knows to call for any other issues that may develop before the next visit. Katherinne Mofield, Virgie Dad, MD  02/04/19 2:59 PM Medical Oncology and Hematology [facility] [address] [place], [place] [address] Tel. [phone]    Fax. [phone]   I, Jacqualyn Posey am acting as a Education administrator for Chauncey Cruel, MD.   I, Lurline Del MD, have reviewed the above documentation for accuracy and completeness, and I agree with the above.

## 2019-02-04 ENCOUNTER — Other Ambulatory Visit: Payer: Self-pay

## 2019-02-04 ENCOUNTER — Inpatient Hospital Stay: Payer: Medicare Other

## 2019-02-04 ENCOUNTER — Inpatient Hospital Stay: Payer: Medicare Other | Attending: Oncology | Admitting: Oncology

## 2019-02-04 VITALS — BP 149/70 | HR 73 | Temp 98.5°F | Resp 18 | Wt 214.2 lb

## 2019-02-04 DIAGNOSIS — Z79899 Other long term (current) drug therapy: Secondary | ICD-10-CM | POA: Diagnosis not present

## 2019-02-04 DIAGNOSIS — N189 Chronic kidney disease, unspecified: Secondary | ICD-10-CM | POA: Insufficient documentation

## 2019-02-04 DIAGNOSIS — Z87891 Personal history of nicotine dependence: Secondary | ICD-10-CM | POA: Insufficient documentation

## 2019-02-04 DIAGNOSIS — I129 Hypertensive chronic kidney disease with stage 1 through stage 4 chronic kidney disease, or unspecified chronic kidney disease: Secondary | ICD-10-CM | POA: Insufficient documentation

## 2019-02-04 DIAGNOSIS — E039 Hypothyroidism, unspecified: Secondary | ICD-10-CM | POA: Insufficient documentation

## 2019-02-04 DIAGNOSIS — Z170 Estrogen receptor positive status [ER+]: Secondary | ICD-10-CM

## 2019-02-04 DIAGNOSIS — C50212 Malignant neoplasm of upper-inner quadrant of left female breast: Secondary | ICD-10-CM

## 2019-02-04 DIAGNOSIS — Z79811 Long term (current) use of aromatase inhibitors: Secondary | ICD-10-CM | POA: Insufficient documentation

## 2019-02-04 LAB — CBC WITH DIFFERENTIAL/PLATELET
Abs Immature Granulocytes: 0.01 10*3/uL (ref 0.00–0.07)
Basophils Absolute: 0 10*3/uL (ref 0.0–0.1)
Basophils Relative: 1 %
Eosinophils Absolute: 0.3 10*3/uL (ref 0.0–0.5)
Eosinophils Relative: 6 %
HCT: 40 % (ref 36.0–46.0)
Hemoglobin: 14.1 g/dL (ref 12.0–15.0)
Immature Granulocytes: 0 %
Lymphocytes Relative: 17 %
Lymphs Abs: 0.8 10*3/uL (ref 0.7–4.0)
MCH: 35.2 pg — ABNORMAL HIGH (ref 26.0–34.0)
MCHC: 35.3 g/dL (ref 30.0–36.0)
MCV: 99.8 fL (ref 80.0–100.0)
Monocytes Absolute: 0.4 10*3/uL (ref 0.1–1.0)
Monocytes Relative: 7 %
Neutro Abs: 3.3 10*3/uL (ref 1.7–7.7)
Neutrophils Relative %: 69 %
Platelets: 70 10*3/uL — ABNORMAL LOW (ref 150–400)
RBC: 4.01 MIL/uL (ref 3.87–5.11)
RDW: 12.8 % (ref 11.5–15.5)
WBC: 4.8 10*3/uL (ref 4.0–10.5)
nRBC: 0 % (ref 0.0–0.2)

## 2019-02-04 LAB — COMPREHENSIVE METABOLIC PANEL
ALT: 31 U/L (ref 0–44)
AST: 50 U/L — ABNORMAL HIGH (ref 15–41)
Albumin: 3.7 g/dL (ref 3.5–5.0)
Alkaline Phosphatase: 134 U/L — ABNORMAL HIGH (ref 38–126)
Anion gap: 9 (ref 5–15)
BUN: 9 mg/dL (ref 8–23)
CO2: 23 mmol/L (ref 22–32)
Calcium: 9.2 mg/dL (ref 8.9–10.3)
Chloride: 107 mmol/L (ref 98–111)
Creatinine, Ser: 0.66 mg/dL (ref 0.44–1.00)
GFR calc Af Amer: >60 mL/min (ref >60)
GFR calc non Af Amer: >60 mL/min (ref >60)
Glucose, Bld: 117 mg/dL — ABNORMAL HIGH (ref 70–99)
Potassium: 3.6 mmol/L (ref 3.5–5.1)
Sodium: 139 mmol/L (ref 135–145)
Total Bilirubin: 1.5 mg/dL — ABNORMAL HIGH (ref 0.3–1.2)
Total Protein: 6.4 g/dL — ABNORMAL LOW (ref 6.5–8.1)

## 2019-02-05 ENCOUNTER — Telehealth: Payer: Self-pay | Admitting: Oncology

## 2019-02-05 LAB — VITAMIN D 25 HYDROXY (VIT D DEFICIENCY, FRACTURES): Vit D, 25-Hydroxy: 59 ng/mL (ref 30.0–100.0)

## 2019-02-05 NOTE — Telephone Encounter (Signed)
I left a message regarding schedule  

## 2019-02-13 DIAGNOSIS — L570 Actinic keratosis: Secondary | ICD-10-CM | POA: Diagnosis not present

## 2019-02-13 DIAGNOSIS — L905 Scar conditions and fibrosis of skin: Secondary | ICD-10-CM | POA: Diagnosis not present

## 2019-02-13 DIAGNOSIS — Z85828 Personal history of other malignant neoplasm of skin: Secondary | ICD-10-CM | POA: Diagnosis not present

## 2019-03-06 ENCOUNTER — Encounter: Payer: Self-pay | Admitting: Family Medicine

## 2019-03-06 DIAGNOSIS — Z853 Personal history of malignant neoplasm of breast: Secondary | ICD-10-CM | POA: Diagnosis not present

## 2019-03-19 ENCOUNTER — Telehealth: Payer: Self-pay | Admitting: Adult Health

## 2019-03-19 DIAGNOSIS — D225 Melanocytic nevi of trunk: Secondary | ICD-10-CM | POA: Diagnosis not present

## 2019-03-19 DIAGNOSIS — L821 Other seborrheic keratosis: Secondary | ICD-10-CM | POA: Diagnosis not present

## 2019-03-19 DIAGNOSIS — D1801 Hemangioma of skin and subcutaneous tissue: Secondary | ICD-10-CM | POA: Diagnosis not present

## 2019-03-19 DIAGNOSIS — D485 Neoplasm of uncertain behavior of skin: Secondary | ICD-10-CM | POA: Diagnosis not present

## 2019-03-19 DIAGNOSIS — L814 Other melanin hyperpigmentation: Secondary | ICD-10-CM | POA: Diagnosis not present

## 2019-03-19 NOTE — Telephone Encounter (Signed)
I left a message regarding reschedule

## 2019-03-20 DIAGNOSIS — Z23 Encounter for immunization: Secondary | ICD-10-CM | POA: Diagnosis not present

## 2019-04-08 ENCOUNTER — Encounter: Payer: Self-pay | Admitting: Family Medicine

## 2019-04-11 ENCOUNTER — Encounter: Payer: Medicare Other | Admitting: Adult Health

## 2019-04-17 ENCOUNTER — Encounter: Payer: Self-pay | Admitting: Adult Health

## 2019-04-25 ENCOUNTER — Inpatient Hospital Stay: Payer: Medicare Other | Attending: Adult Health | Admitting: Adult Health

## 2019-04-25 DIAGNOSIS — N189 Chronic kidney disease, unspecified: Secondary | ICD-10-CM | POA: Insufficient documentation

## 2019-04-25 DIAGNOSIS — Z170 Estrogen receptor positive status [ER+]: Secondary | ICD-10-CM

## 2019-04-25 DIAGNOSIS — E039 Hypothyroidism, unspecified: Secondary | ICD-10-CM | POA: Diagnosis not present

## 2019-04-25 DIAGNOSIS — Z79811 Long term (current) use of aromatase inhibitors: Secondary | ICD-10-CM | POA: Insufficient documentation

## 2019-04-25 DIAGNOSIS — C50212 Malignant neoplasm of upper-inner quadrant of left female breast: Secondary | ICD-10-CM | POA: Insufficient documentation

## 2019-04-25 DIAGNOSIS — I129 Hypertensive chronic kidney disease with stage 1 through stage 4 chronic kidney disease, or unspecified chronic kidney disease: Secondary | ICD-10-CM | POA: Insufficient documentation

## 2019-04-25 NOTE — Progress Notes (Signed)
SURVIVORSHIP VIRTUAL VISIT:  I connected with Pearlean Brownie on 04/25/19 at  3:30 PM EST by my chart video and verified that I am speaking with the correct person using two identifiers.  I discussed the limitations, risks, security and privacy concerns of performing an evaluation and management service virtually and the availability of in person appointments. I also discussed with the patient that there may be a patient responsible charge related to this service. The patient expressed understanding and agreed to proceed.   She was at home and I was at home  BRIEF ONCOLOGIC HISTORY:  Oncology History  Malignant neoplasm of upper-inner quadrant of left breast in female, estrogen receptor positive ([place])  06/20/2018 Initial Diagnosis   68 y.o. [place] Park[place], [place] woman s/p Left breast upper inner quadrant biopsy 06/20/2018 for a clinical T1b No, stage IA invasive ducatal carcinoma, grade 1, estrogen and progesterone receptor positive, HER-2 not amplified, with an Mib-1 of 5%     07/12/2018 Surgery   (1) status post left lumpectomy and sentinel lymph node sampling 07/12/2018 for a pT1b pN0, stage IA invasive ductal carcinoma, grade 1, with close but negative margins.             (a) a total of 2 sentinel lymph nodes were removed   08/13/2018 - 09/07/2018 Radiation Therapy    adjuvant radiation03/02/2019 - 09/07/2018  Site/dose:   The patient initially received a dose of 42.56 Gy in 16 fractions to the left breast using whole-breast tangent fields. This was delivered using a 3-D conformal technique. The patient then received a boost to the seroma. This delivered an additional 8 Gy in 4 fractions using a 3 field photon technique due to the depth of the seroma. The total dose was 50.56 Gy.      10/2018 -  Anti-estrogen oral therapy   anastrozole started 10/19/2018             (a) bone density scan at [facility] 07/30/2015 shows a T score of -0.3             (b) bone density September 2020     INTERVAL HISTORY:   Ms. Hur to review her survivorship care plan detailing her treatment course for breast cancer, as well as monitoring long-term side effects of that treatment, education regarding health maintenance, screening, and overall wellness and health promotion.     Overall, Ms. Sedivy reports feeling moderately well.  She is taking Anastrozole daily and is experiencing hot flashes, difficulty sleeping, and myalgias.  She notes the hot flashes are worse at night.  She normally takes the anastrozole at night.  She has reviewed this with Dr. Jana Hakim, and he offered Gabapentin, but she wanted to monitor for the time being.    REVIEW OF SYSTEMS:  Review of Systems  Constitutional: Negative for appetite change, chills, fatigue, fever and unexpected weight change.  HENT:   Negative for hearing loss, lump/mass, nosebleeds, sore throat and trouble swallowing.   Eyes: Negative for eye problems and icterus.  Respiratory: Negative for chest tightness, cough and shortness of breath.   Cardiovascular: Negative for chest pain, leg swelling and palpitations.  Gastrointestinal: Negative for abdominal distention, abdominal pain, constipation, diarrhea, nausea and vomiting.  Endocrine: Positive for hot flashes.  Genitourinary: Negative for dyspareunia.   Musculoskeletal: Negative for arthralgias.  Skin: Negative for itching and rash.  Neurological: Negative for dizziness, extremity weakness, headaches and numbness.  Hematological: Negative for adenopathy. Does not bruise/bleed easily.  Psychiatric/Behavioral: Negative for depression. The patient  is not nervous/anxious.   Breast: Denies any new nodularity, masses, tenderness, nipple changes, or nipple discharge.      ONCOLOGY TREATMENT TEAM:  1. Surgeon:  Dr. Donne Hazel at [facility] 2. Medical Oncologist: Dr. Jana Hakim  3. Radiation Oncologist: Dr. Lisbeth Renshaw    PAST MEDICAL/SURGICAL HISTORY:  Past Medical History:  Diagnosis Date  . Allergy   .  Cancer ([place])    basal cell skin CA  . Chronic kidney disease    hx of kidney stones  . Conjunctivitis    chronic  . Fatty liver 2015  . GERD (gastroesophageal reflux disease)   . Glaucoma   . Hearing loss in left ear   . History of kidney stones   . Hyperlipidemia    no per pt  . Hypertension   . Menopausal syndrome   . NASH (nonalcoholic steatohepatitis)    with cirrhosis and mild esoph varicies  . Primary hypothyroidism    Past Surgical History:  Procedure Laterality Date  . ABDOMINAL HYSTERECTOMY     partial ? prolapse  . BREAST LUMPECTOMY WITH RADIOACTIVE SEED AND SENTINEL LYMPH NODE BIOPSY Left 07/12/2018   Procedure: LEFT BREAST LUMPECTOMY WITH RADIOACTIVE SEED AND LEFT AXILLARY SENTINEL LYMPH NODE BIOPSY;  Surgeon: Rolm Bookbinder, MD;  Location: [facility];  Service: General;  Laterality: Left;  . CHOLECYSTECTOMY    . ESOPHAGOGASTRODUODENOSCOPY    . TONSILLECTOMY       ALLERGIES:  Allergies  Allergen Reactions  . Codeine     REACTION: Throat swelling  . Ivp Dye [Iodinated Diagnostic Agents] Swelling     CURRENT MEDICATIONS:  Outpatient Encounter Medications as of 04/25/2019  Medication Sig  . anastrozole (ARIMIDEX) 1 MG tablet Take 1 tablet (1 mg total) by mouth daily.  . Biotin 2500 MCG CAPS Take 1 capsule by mouth daily. 5,000 mcg  . Cholecalciferol (VITAMIN D3) 2000 units TABS Take 1 tablet by mouth daily.  Marland Kitchen levothyroxine (SYNTHROID, LEVOTHROID) 175 MCG tablet Take 1 tablet (175 mcg total) by mouth daily before breakfast.  . loratadine (CLARITIN) 10 MG tablet Take 10 mg by mouth daily as needed.    . metoprolol succinate (TOPROL-XL) 100 MG 24 hr tablet Take with or immediately following a meal.  . Multiple Vitamin (MULTIVITAMIN) capsule Take 1 capsule by mouth daily.    Marland Kitchen oxyCODONE (OXY IR/ROXICODONE) 5 MG immediate release tablet Take 1 tablet (5 mg total) by mouth every 6 (six) hours as needed for moderate pain, severe pain or breakthrough pain.  .  potassium chloride SA (K-DUR,KLOR-CON) 20 MEQ tablet Take 1 tablet by mouth every day  . triamterene-hydrochlorothiazide (MAXZIDE-25) 37.5-25 MG tablet Take 1 tablet by mouth daily.   No facility-administered encounter medications on file as of 04/25/2019.      ONCOLOGIC FAMILY HISTORY:  Family History  Problem Relation Age of Onset  . Heart disease Mother        CAD  . Hypertension Mother   . Glaucoma Mother   . Heart disease Father        CAD  . Diabetes Father   . Hypertension Father   . Cancer Father        ? CA - squamous cell carcinoma  . Colon cancer Neg Hx   . Esophageal cancer Neg Hx   . Rectal cancer Neg Hx   . Stomach cancer Neg Hx      GENETIC COUNSELING/TESTING: Not at this time  SOCIAL HISTORY:  Social History   Socioeconomic History  .  Marital status: Married    Spouse name: Dwaine Deter. [place]  . Number of children: Not on file  . Years of education: Not on file  . Highest education level: Not on file  Occupational History  . Not on file  Social Needs  . Financial resource strain: Not on file  . Food insecurity    Worry: Not on file    Inability: Not on file  . Transportation needs    Medical: Not on file    Non-medical: Not on file  Tobacco Use  . Smoking status: Former Smoker    Quit date: 06/06/1982    Years since quitting: 36.9  . Smokeless tobacco: Never Used  Substance and Sexual Activity  . Alcohol use: Yes    Alcohol/week: 0.0 standard drinks    Comment: wine- rare  . Drug use: No  . Sexual activity: Not on file  Lifestyle  . Physical activity    Days per week: Not on file    Minutes per session: Not on file  . Stress: Not on file  Relationships  . Social Herbalist on phone: Not on file    Gets together: Not on file    Attends religious service: Not on file    Active member of club or organization: Not on file    Attends meetings of clubs or organizations: Not on file    Relationship status: Not on file  . Intimate  partner violence    Fear of current or ex partner: Not on file    Emotionally abused: Not on file    Physically abused: Not on file    Forced sexual activity: Not on file  Other Topics Concern  . Not on file  Social History Narrative  . Not on file     OBSERVATIONS/OBJECTIVE:   LABORATORY DATA:  None for this visit.  DIAGNOSTIC IMAGING:  None for this visit.      ASSESSMENT AND PLAN:  Ms.. Heney is a pleasant 68 y.o. female with Stage IA left breast invasive ductal carcinoma, ER+/PR+/HER2-, diagnosed in 06/2018, treated with lumpectomy, adjuvant radiation therapy, and anti-estrogen therapy with Anastrozole beginning in 10/2018.  She presents to the Survivorship Clinic for our initial meeting and routine follow-up post-completion of treatment for breast cancer.    1. Stage IA left breast cancer:  Ms. Simerly is continuing to recover from definitive treatment for breast cancer. She will follow-up with her medical oncologist, Dr. Jana Hakim in 08/2019 with history and physical exam per surveillance protocol.  She will continue her anti-estrogen therapy with Anastrozole. Thus far, she is tolerating the moderately well, with minimal side effects. She was instructed to make Dr. Lindi Adie or myself aware if she begins to experience any worsening side effects of the medication and I could see her back in clinic to help manage those side effects, as needed. Her mammogram is due 02/2020; orders placed today. Today, a comprehensive survivorship care plan and treatment summary was reviewed with the patient today detailing her breast cancer diagnosis, treatment course, potential late/long-term effects of treatment, appropriate follow-up care with recommendations for the future, and patient education resources.  A copy of this summary, along with a letter will be sent to the patient's primary care provider via mail/fax/In Basket message after today's visit.    2. Hot flashes: We reviewed hot flashes, and I  recommended she try changing the time of day that she takes the Anastrozole as that may help.  We also discussed  non pharmacologic interventions such as dressing in layers, avoiding common triggers of hot flashes.  We also talked about Gabapentin as that can help with hot flashes.  She would like to try a couple more things before starting a medication, and will call us if she wants to discuss Gabapentin further.    3. Bone health:  Given Ms. Piano's age/history of breast cancer and her current treatment regimen including anti-estrogen therapy with Anastrozole, she is at risk for bone demineralization.  Her last DEXA scan was in 2017, which was normal. We will call [facility] and see if we can get records from testing in 02/2019.  She was given education on specific activities to promote bone health.  4. Cancer screening:  Due to Ms. Vilchis's history and her age, she should receive screening for skin cancers, colon cancer, and gynecologic cancers.  The information and recommendations are listed on the patient's comprehensive care plan/treatment summary and were reviewed in detail with the patient.    5. Health maintenance and wellness promotion: Ms. Achey was encouraged to consume 5-7 servings of fruits and vegetables per day. We reviewed the "Nutrition Rainbow" handout, as well as the handout "Take Control of Your Health and Reduce Your Cancer Risk" from the [employer].  She was also encouraged to engage in moderate to vigorous exercise for 30 minutes per day most days of the week. We discussed the [employer] fitness program, which is designed for cancer survivors to help them become more physically fit after cancer treatments.  She was instructed to limit her alcohol consumption and continue to abstain from tobacco use.     6. Support services/counseling: It is not uncommon for this period of the patient's cancer care trajectory to be one of many emotions and stressors.  We discussed how this  can be increasingly difficult during the times of quarantine and social distancing due to the COVID-19 pandemic.   She was given information regarding our available services and encouraged to contact me with any questions or for help enrolling in any of our support group/programs.    Follow up instructions:    -Return to cancer center 08/2019  -Mammogram due in 02/2020 -She is welcome to return back to the Survivorship Clinic at any time; no additional follow-up needed at this time.  -Consider referral back to survivorship as a long-term survivor for continued surveillance  The patient was provided an opportunity to ask questions and all were answered. The patient agreed with the plan and demonstrated an understanding of the instructions.   The patient was advised to call back or seek an in-person evaluation if the symptoms worsen or if the condition fails to improve as anticipated.   I provided 25 minutes of face-to-face video visit time during this encounter, and > 50% was spent counseling as documented under my assessment & plan.  Scot Dock, NP

## 2019-04-26 ENCOUNTER — Encounter: Payer: Self-pay | Admitting: Adult Health

## 2019-05-14 ENCOUNTER — Encounter: Payer: Self-pay | Admitting: *Deleted

## 2019-06-03 ENCOUNTER — Encounter: Payer: Self-pay | Admitting: *Deleted

## 2019-07-05 ENCOUNTER — Ambulatory Visit: Payer: Medicare Other

## 2019-07-13 ENCOUNTER — Ambulatory Visit: Payer: Medicare Other | Attending: Internal Medicine

## 2019-07-13 DIAGNOSIS — Z23 Encounter for immunization: Secondary | ICD-10-CM | POA: Insufficient documentation

## 2019-07-13 NOTE — Progress Notes (Signed)
   Covid-19 Vaccination Clinic  Name:  Selena Johnson    MRN: [id number] DOB: 1950-07-14  07/13/2019  Selena Johnson was observed post Covid-19 immunization for 15 minutes without incidence. She was provided with Vaccine Information Sheet and instruction to access the V-Safe system.   Selena Johnson was instructed to call 911 with any severe reactions post vaccine: Marland Kitchen Difficulty breathing  . Swelling of your face and throat  . A fast heartbeat  . A bad rash all over your body  . Dizziness and weakness    Immunizations Administered    Name Date Dose VIS Date Route   Pfizer COVID-19 Vaccine 07/13/2019 12:06 PM 0.3 mL 05/17/2019 Intramuscular   Manufacturer: [employer]   Lot: [id number]   [place]: [id number]

## 2019-07-26 ENCOUNTER — Ambulatory Visit: Payer: Medicare Other

## 2019-08-03 NOTE — Progress Notes (Addendum)
[place]  [phone] [phone]) [phone]     ID: Selena Johnson DOB: 1950/10/18  MR#: [id number]  IWO#:[id number]  Patient Care Team: Abner Greenspan, MD as PCP - General Vin-Parikh, Deirdre Peer, MD as Referring Physician (Ophthalmology) Glennie Isle, PA-C as Physician Assistant (Physician Assistant) Magrinat, Virgie Dad, MD as Consulting Physician (Oncology) Rolm Bookbinder, MD as Consulting Physician (General Surgery) Kyung Rudd, MD as Consulting Physician (Radiation Oncology) Druscilla Brownie, MD as Referring Physician (Dermatology) Mauro Kaufmann, RN as Oncology Nurse Navigator Rockwell Germany, RN as Oncology Nurse Navigator OTHER MD:    CHIEF COMPLAINT: Estrogen receptor positive breast cancer  CURRENT TREATMENT: Anastrozole   INTERVAL HISTORY: Selena Johnson returns today for follow-up of her estrogen receptor positive breast cancer.  She continues on anastrozole, which she takes in the evening.  She does have some night sweats and it keeps her up, but her husband also keeps her up she says and she does not want to take additional pills like gabapentin.  She can live with the problem.  She has no other side effects from this medication. Selena Johnson's last bone density screening on 07/30/2015, showed a T-score of -0.3, which is considered normal.    Since her last visit, she underwent bilateral diagnostic mammography with tomography at [facility] on 03/06/2019 showing: breast density category C; no evidence of malignancy in either breast.  She did not have her bone density at that time.   REVIEW OF SYSTEMS: Selena Johnson had her first Covid vaccine a week ago.  She that was no problem.  She likes to walk on good weather days, up to 2 miles at a time.  Unfortunately her dog died.  She is not planning to get another one anytime soon.  She has had no falls, no balance problems, no unusual headaches, visual changes, nausea, vomiting, cough, phlegm production, pleurisy,  or change in bowel or bladder habits.  A detailed review of systems was otherwise stable.   HISTORY OF CURRENT ILLNESS: From the original intake note:  Selena Johnson underwent routine screening mammography at [facility] on 02/28/2018 showing an irregularity in the left breast. On 03/02/2018, she followed up with a unilateral left diagnostic mammogram and unilateral left ultrasound where a biopsy was recommended. A biopsy was performed on 03/07/2018. Pathology from the procedure ([id number]) showed benign breast tissue with no specific histopathologic changes, negative for carcinoma. A three month follow up was recommended.  At her follow up, she underwent bilateral diagnostic mammography with tomography and left breast ultrasonography at [facility] on 06/12/2018 showing: Breast Density Category C. There os a 0.7 cm oval mass in the left breast at 9 o'clock posterior depth 9 cm from the nipple. There is a questioned mild architectural distortion associated with the mass. Biopsy clip is noted 1 cm medial and 1.7 cm inferior to the mass. No other significant masses or calcifications are seen in the breast. On ultrasound, there is a 0.9 x 0.7 cm oval mass with an angular margin in the left breast at 9:30 o'clock 8 cm from the nipple. Color flow imaging demonstrates that there is vascularity present. Elastography imaging assessment is soft. No definite biopsy clip is associated with this mass. This mass corresponds to the mammographic mass. No significant abnormalities were seen sonographically in the left axilla.   Accordingly on 06/20/2018 she proceeded to biopsy of the left breast area in question. The pathology from this procedure showed ([id number]): invasive ductal carcinoma, grade I. Prognostic indicators significant for: estrogen receptor, 100%  positive and progesterone receptor, 20% positive, both with strong staining intensity. Proliferation marker Ki67 at 3%. HER2 negative (1+) by immunohistochemistry.  The  patient's subsequent history is as detailed below.   PAST MEDICAL HISTORY: Past Medical History:  Diagnosis Date  . Allergy   . Cancer ([place])    basal cell skin CA  . Chronic kidney disease    hx of kidney stones  . Conjunctivitis    chronic  . Fatty liver 2015  . GERD (gastroesophageal reflux disease)   . Glaucoma   . Hearing loss in left ear   . History of kidney stones   . Hyperlipidemia    no per pt  . Hypertension   . Menopausal syndrome   . NASH (nonalcoholic steatohepatitis)    with cirrhosis and mild esoph varicies  . Primary hypothyroidism     PAST SURGICAL HISTORY: Past Surgical History:  Procedure Laterality Date  . ABDOMINAL HYSTERECTOMY     partial ? prolapse  . BREAST LUMPECTOMY WITH RADIOACTIVE SEED AND SENTINEL LYMPH NODE BIOPSY Left 07/12/2018   Procedure: LEFT BREAST LUMPECTOMY WITH RADIOACTIVE SEED AND LEFT AXILLARY SENTINEL LYMPH NODE BIOPSY;  Surgeon: Rolm Bookbinder, MD;  Location: [place];  Service: General;  Laterality: Left;  . CHOLECYSTECTOMY    . ESOPHAGOGASTRODUODENOSCOPY    . TONSILLECTOMY      FAMILY HISTORY: Family History  Problem Relation Age of Onset  . Heart disease Mother        CAD  . Hypertension Mother   . Glaucoma Mother   . Heart disease Father        CAD  . Diabetes Father   . Hypertension Father   . Cancer Father        ? CA - squamous cell carcinoma  . Colon cancer Neg Hx   . Esophageal cancer Neg Hx   . Rectal cancer Neg Hx   . Stomach cancer Neg Hx    Selena Johnson's father died from squamous cell cancer of the lung at age 67. Patients' mother died from stroke and sepsis at age 82. The patient has 1 brother and 1 sister. Her brother has advanced renal cell CA and is being treated at [facility]. Patient denies anyone in her family having breast, ovarian, prostate, or pancreatic cancer.    GYNECOLOGIC HISTORY:  No LMP recorded. Patient has had a hysterectomy. 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  He is a little older than she and she is primary caregiver.  She has no birth children and one deceased step-child. Selena Johnson has no grandchildren. She attends a [employer].   ADVANCED DIRECTIVES: Her husband, Nadara Mustard.   HEALTH MAINTENANCE: Social History   Tobacco Use  . Smoking status: Former Smoker    Quit date: 06/06/1982    Years since quitting: 37.1  . Smokeless tobacco: Never Used  Substance Use Topics  . Alcohol use: Yes    Alcohol/week: 0.0 standard drinks    Comment: wine- rare  . Drug use: No    Colonoscopy: yes, Nandigam  PAP:   Bone density: 07/30/2015 at [facility], score -0.3   Allergies  Allergen Reactions  . Codeine     REACTION: Throat swelling  . Ivp Dye [Iodinated Diagnostic Agents] Swelling    Current Outpatient Medications  Medication Sig Dispense Refill  . anastrozole (ARIMIDEX) 1 MG tablet Take 1 tablet (1 mg total) by mouth daily. 90 tablet 4  . Biotin 2500 MCG CAPS Take 1 capsule by mouth daily. 5,000 mcg    . Cholecalciferol (VITAMIN D3) 2000 units TABS Take 1 tablet by mouth daily.    Marland Kitchen levothyroxine (SYNTHROID, LEVOTHROID) 175 MCG tablet Take 1 tablet (175 mcg total) by mouth daily before breakfast. 90 tablet 3  . loratadine (CLARITIN) 10 MG tablet Take 10 mg by mouth daily as needed.      . metoprolol succinate (TOPROL-XL) 100 MG 24 hr tablet Take with or immediately following a meal. 90 tablet 3  . Multiple Vitamin (MULTIVITAMIN) capsule Take 1 capsule by mouth daily.      . potassium chloride SA (K-DUR,KLOR-CON) 20 MEQ tablet Take 1 tablet by mouth every day 90 tablet 3  . triamterene-hydrochlorothiazide (MAXZIDE-25) 37.5-25 MG tablet Take 1 tablet by mouth daily. 90 tablet 3   No current facility-administered medications for this visit.     OBJECTIVE: Middle-aged white woman who appears stated age  81:   08/05/19 1053  BP: 131/70  Pulse: 74  Resp: 17  Temp: 98.5 F (36.9 C)  SpO2: 100%   Wt Readings from Last 3 Encounters:  08/05/19 212 lb 3.2 oz (96.3 kg)  02/04/19 214 lb 3.2 oz (97.2 kg)  08/03/18 213 lb 6 oz (96.8 kg)   Body mass index is 35.31 kg/m.    ECOG FS:1 - Symptomatic but completely ambulatory  Sclerae unicteric, EOMs intact Wearing a mask No cervical or supraclavicular adenopathy Lungs no rales or rhonchi Heart regular rate and rhythm Abd soft, obese, nontender, positive bowel sounds MSK no focal spinal tenderness, no upper extremity lymphedema Neuro: nonfocal, well oriented, appropriate affect Breasts: The right breast is unremarkable.  The left breast has undergone lumpectomy followed by radiation.  There is no evidence of disease recurrence.  Both axillae are benign.   LAB RESULTS:  CMP     Component Value Date/Time   NA 139 02/04/2019 1330   K 3.6 02/04/2019 1330   CL 107 02/04/2019 1330   CO2 23 02/04/2019 1330   GLUCOSE 117 (H) 02/04/2019 1330   BUN 9 02/04/2019 1330   CREATININE 0.66 02/04/2019 1330   CREATININE 0.73 07/04/2018 0823   CALCIUM 9.2 02/04/2019 1330   PROT 6.4 (L) 02/04/2019 1330   ALBUMIN 3.7 02/04/2019 1330   AST 50 (H) 02/04/2019 1330   AST 53 (H) 07/04/2018 0823   ALT 31 02/04/2019 1330   ALT 36 07/04/2018 0823   ALKPHOS 134 (H) 02/04/2019 1330   BILITOT 1.5 (H) 02/04/2019 1330   BILITOT 2.2 (H) 07/04/2018 0823   GFRNONAA >60 02/04/2019 1330   GFRNONAA >60 07/04/2018 0823   GFRAA >60 02/04/2019 1330   GFRAA >60 07/04/2018 0823    No results found for: TOTALPROTELP, ALBUMINELP, A1GS, A2GS, BETS, BETA2SER, GAMS, MSPIKE, SPEI  No results found for: KPAFRELGTCHN, LAMBDASER, KAPLAMBRATIO  Lab Results  Component Value Date   WBC 4.8 02/04/2019   NEUTROABS 3.3 02/04/2019   HGB 14.1 02/04/2019   HCT 40.0 02/04/2019   MCV 99.8 02/04/2019   PLT  70 (L) 02/04/2019   No results found for: LABCA2  No components found for: BDZHGD924  No results for input(s): INR in the last 168 hours.  No results found for: LABCA2  No results found for: QAS341  No results found for: DQQ229  No results found for: NLG921  No results found for: CA2729  No components found for: HGQUANT  No results found for: CEA1 / No results found for: CEA1   No results found for: AFPTUMOR  No results found for: CHROMOGRNA  No results found for: HGBA, HGBA2QUANT, HGBFQUANT, HGBSQUAN (Hemoglobinopathy evaluation)   No results found for: LDH  No results found for: IRON, TIBC, IRONPCTSAT (Iron and TIBC)  Lab Results  Component Value Date   FERRITIN 171.6 10/25/2012    Urinalysis    Component Value Date/Time   BILIRUBINUR neg. 05/21/2013 1623   PROTEINUR neg. 05/21/2013 1623   UROBILINOGEN 0.2 05/21/2013 1623   NITRITE neg. 05/21/2013 1623   LEUKOCYTESUR small (1+) 05/21/2013 1623    STUDIES:  No results found.    ELIGIBLE FOR AVAILABLE RESEARCH PROTOCOL: no   ASSESSMENT: 69 y.o. Fernand Parkins, [place] woman s/p Left breast upper inner quadrant biopsy 06/20/2018 for a clinical T1b No, stage IA invasive ducatal carcinoma, grade 1, estrogen and progesterone receptor positive, HER-2 not amplified, with an Mib-1 of 5%  (1) status post left lumpectomy and sentinel lymph node sampling 07/12/2018 for a pT1b pN0, stage IA invasive ductal carcinoma, grade 1, with close but negative margins.  (a) a total of 2 sentinel lymph nodes were removed  (2) adjuvant radiation03/02/2019 - 09/07/2018 Site/dose:   The patient initially received a dose of 42.56 Gy in 16 fractions to the left breast using whole-breast tangent fields. This was delivered using a 3-D conformal technique. The patient then received a boost to the seroma. This delivered an additional 8 Gy in 4 fractions using a 3 field photon technique due to the depth of the seroma. The total dose was 50.56  Gy.   (3) anastrozole started 10/19/2018  (a) bone density scan at [facility] 07/30/2015 shows a T score of -0.3  (b) bone density October 2021   PLAN: [place] is now a year out from definitive surgery for her breast cancer with no evidence of disease recurrence.  This is favorable.  She is tolerating anastrozole well except for the nighttime hot flashes.  She is not interested in gabapentin or venlafaxine for this problem, which she tells me she can easily manage.  She is getting her Covid shots without complications.  Her husband has ready had his.  I have encouraged her to exercise regularly.  She will have mammography and a bone density at [facility] October of this year.  Those orders are in place  She knows to call for any other issue that may develop before then.  Total encounter time 25 minutes.*  Magrinat, Virgie Dad, MD  08/05/19 11:00 AM Medical Oncology and Hematology [facility] [place], [place] [address] Tel. [phone]    Fax. [phone]    I, Wilburn Mylar, am acting as scribe for Dr. Virgie Dad. Magrinat.  I, Lurline Del MD, have reviewed the above documentation for accuracy and completeness, and I agree with the above.    *Total Encounter Time as defined by the Centers for Medicare and Medicaid Services includes, in addition to the face-to-face time of a patient visit (documented in the note above) non-face-to-face time: obtaining and reviewing outside history, ordering and reviewing medications, tests or procedures, care coordination (communications with other health care professionals or caregivers) and documentation in the medical record.

## 2019-08-05 ENCOUNTER — Inpatient Hospital Stay: Payer: Medicare Other | Attending: Oncology | Admitting: Oncology

## 2019-08-05 ENCOUNTER — Other Ambulatory Visit: Payer: Self-pay

## 2019-08-05 ENCOUNTER — Inpatient Hospital Stay: Payer: Medicare Other

## 2019-08-05 VITALS — BP 131/70 | HR 74 | Temp 98.5°F | Resp 17 | Ht 65.0 in | Wt 212.2 lb

## 2019-08-05 DIAGNOSIS — Z79811 Long term (current) use of aromatase inhibitors: Secondary | ICD-10-CM | POA: Diagnosis not present

## 2019-08-05 DIAGNOSIS — Z170 Estrogen receptor positive status [ER+]: Secondary | ICD-10-CM | POA: Insufficient documentation

## 2019-08-05 DIAGNOSIS — C50212 Malignant neoplasm of upper-inner quadrant of left female breast: Secondary | ICD-10-CM | POA: Diagnosis not present

## 2019-08-05 MED ORDER — ANASTROZOLE 1 MG PO TABS: 1.0000 mg | ORAL_TABLET | Freq: Every day | ORAL | 4 refills | Status: DC

## 2019-08-06 ENCOUNTER — Telehealth: Payer: Self-pay | Admitting: Oncology

## 2019-08-06 NOTE — Telephone Encounter (Signed)
I talk with patient regarding schedule  

## 2019-08-07 ENCOUNTER — Ambulatory Visit: Payer: Medicare Other | Attending: Internal Medicine

## 2019-08-07 DIAGNOSIS — Z23 Encounter for immunization: Secondary | ICD-10-CM | POA: Insufficient documentation

## 2019-08-07 NOTE — Progress Notes (Signed)
   Covid-19 Vaccination Clinic  Name:  Selena Johnson    MRN: [id number] DOB: February 08, 1951  08/07/2019  [name] was observed post Covid-19 immunization for 15 minutes without incident. She was provided with Vaccine Information Sheet and instruction to access the V-Safe system.   [name] was instructed to call 911 with any severe reactions post vaccine: Marland Kitchen Difficulty breathing  . Swelling of face and throat  . A fast heartbeat  . A bad rash all over body  . Dizziness and weakness   Immunizations Administered    Name Date Dose VIS Date Route   Pfizer COVID-19 Vaccine 08/07/2019  8:31 AM 0.3 mL 05/17/2019 Intramuscular   Manufacturer: [place]   Lot: [id number]   [place]: [id number]

## 2019-08-08 ENCOUNTER — Other Ambulatory Visit: Payer: Self-pay

## 2019-08-08 ENCOUNTER — Ambulatory Visit (INDEPENDENT_AMBULATORY_CARE_PROVIDER_SITE_OTHER): Payer: Medicare Other

## 2019-08-08 ENCOUNTER — Ambulatory Visit: Payer: Medicare Other

## 2019-08-08 ENCOUNTER — Telehealth: Payer: Self-pay | Admitting: Family Medicine

## 2019-08-08 ENCOUNTER — Other Ambulatory Visit (INDEPENDENT_AMBULATORY_CARE_PROVIDER_SITE_OTHER): Payer: Medicare Other

## 2019-08-08 DIAGNOSIS — I10 Essential (primary) hypertension: Secondary | ICD-10-CM

## 2019-08-08 DIAGNOSIS — R7309 Other abnormal glucose: Secondary | ICD-10-CM | POA: Diagnosis not present

## 2019-08-08 DIAGNOSIS — D696 Thrombocytopenia, unspecified: Secondary | ICD-10-CM

## 2019-08-08 DIAGNOSIS — Z0000 Encounter for general adult medical examination without abnormal findings: Secondary | ICD-10-CM

## 2019-08-08 DIAGNOSIS — E7800 Pure hypercholesterolemia, unspecified: Secondary | ICD-10-CM | POA: Diagnosis not present

## 2019-08-08 DIAGNOSIS — E039 Hypothyroidism, unspecified: Secondary | ICD-10-CM

## 2019-08-08 LAB — CBC WITH DIFFERENTIAL/PLATELET
Basophils Absolute: 0 10*3/uL (ref 0.0–0.1)
Basophils Relative: 0.3 % (ref 0.0–3.0)
Eosinophils Absolute: 0.6 10*3/uL (ref 0.0–0.7)
Eosinophils Relative: 11.8 % — ABNORMAL HIGH (ref 0.0–5.0)
HCT: 41.9 % (ref 36.0–46.0)
Hemoglobin: 14.4 g/dL (ref 12.0–15.0)
Lymphocytes Relative: 17.1 % (ref 12.0–46.0)
Lymphs Abs: 0.9 10*3/uL (ref 0.7–4.0)
MCHC: 34.5 g/dL (ref 30.0–36.0)
MCV: 103.7 fl — ABNORMAL HIGH (ref 78.0–100.0)
Monocytes Absolute: 0.4 10*3/uL (ref 0.1–1.0)
Monocytes Relative: 8 % (ref 3.0–12.0)
Neutro Abs: 3.2 10*3/uL (ref 1.4–7.7)
Neutrophils Relative %: 62.8 % (ref 43.0–77.0)
Platelets: 78 10*3/uL — ABNORMAL LOW (ref 150.0–400.0)
RBC: 4.04 Mil/uL (ref 3.87–5.11)
RDW: 14 % (ref 11.5–15.5)
WBC: 5 10*3/uL (ref 4.0–10.5)

## 2019-08-08 LAB — LIPID PANEL
Cholesterol: 202 mg/dL — ABNORMAL HIGH (ref 0–200)
HDL: 64.8 mg/dL (ref >39.00)
LDL Cholesterol: 124 mg/dL — ABNORMAL HIGH (ref 0–99)
NonHDL: 136.77
Total CHOL/HDL Ratio: 3
Triglycerides: 66 mg/dL (ref 0.0–149.0)
VLDL: 13.2 mg/dL (ref 0.0–40.0)

## 2019-08-08 LAB — COMPREHENSIVE METABOLIC PANEL
ALT: 26 U/L (ref 0–35)
AST: 45 U/L — ABNORMAL HIGH (ref 0–37)
Albumin: 3.9 g/dL (ref 3.5–5.2)
Alkaline Phosphatase: 138 U/L — ABNORMAL HIGH (ref 39–117)
BUN: 9 mg/dL (ref 6–23)
CO2: 30 mEq/L (ref 19–32)
Calcium: 9.7 mg/dL (ref 8.4–10.5)
Chloride: 104 mEq/L (ref 96–112)
Creatinine, Ser: 0.56 mg/dL (ref 0.40–1.20)
GFR: 106.77 mL/min (ref >60.00)
Glucose, Bld: 99 mg/dL (ref 70–99)
Potassium: 4 mEq/L (ref 3.5–5.1)
Sodium: 140 mEq/L (ref 135–145)
Total Bilirubin: 1.9 mg/dL — ABNORMAL HIGH (ref 0.2–1.2)
Total Protein: 6.4 g/dL (ref 6.0–8.3)

## 2019-08-08 LAB — TSH: TSH: 0.33 u[IU]/mL — ABNORMAL LOW (ref 0.35–4.50)

## 2019-08-08 LAB — HEMOGLOBIN A1C: Hgb A1c MFr Bld: 4.3 % — ABNORMAL LOW (ref 4.6–6.5)

## 2019-08-08 NOTE — Progress Notes (Signed)
PCP notes:  Health Maintenance: Dexa- Patient will schedule along with her mammogram this year   Abnormal Screenings: none   Patient concerns: none   Nurse concerns: none   Next PCP appt.: 08/15/2019 @ 9:30 am

## 2019-08-08 NOTE — Telephone Encounter (Signed)
Done :) Thanks Tam

## 2019-08-08 NOTE — Progress Notes (Signed)
Subjective:   Selena Johnson is a 69 y.o. female who presents for Medicare Annual (Subsequent) preventive examination.  Review of Systems: N/A   This visit is being conducted through telemedicine via telephone at the nurse health advisor's home address due to the COVID-19 pandemic. This patient has given me verbal consent via doximity to conduct this visit, patient states they are participating from their home address. Patient and myself are on the telephone call. There is no referral for this visit. Some vital signs may be absent or patient reported.    Patient identification: identified by name, DOB, and current address   Cardiac Risk Factors include: advanced age (>52mn, >>65women);hypertension;dyslipidemia     Objective:     Vitals: There were no vitals taken for this visit.  There is no height or weight on file to calculate BMI.  Advanced Directives 08/08/2019 07/09/2018 07/04/2018 07/24/2017 07/21/2016 04/15/2016 04/01/2016  Does Patient Have a Medical Advance Directive? Yes No No Yes Yes Yes Yes  Type of AParamedicof [place] will - - [place] will [place] will - [place] will  Copy of [place] Chart? No - copy requested - - No - copy requested Yes No - copy requested No - copy requested  Would patient like information on creating a medical advance directive? - No - Patient declined No - Patient declined - - - -    Tobacco Social History   Tobacco Use  Smoking Status Former Smoker  . Quit date: 06/06/1982  . Years since quitting: 37.1  Smokeless Tobacco Never Used     Counseling given: Not Answered   Clinical Intake:  Pre-visit preparation completed: Yes  Pain : No/denies pain     Nutritional Risks: None Diabetes: No  How often do you need to have someone help you when you read instructions, pamphlets, or other written materials  from your doctor or pharmacy?: 1 - Never What is the last grade level you completed in school?: 4 years of college  Interpreter Needed?: No  Information entered by :: CJohnson, LPN  Past Medical History:  Diagnosis Date  . Allergy   . Cancer ([facility]    basal cell skin CA  . Chronic kidney disease    hx of kidney stones  . Conjunctivitis    chronic  . Fatty liver 2015  . GERD (gastroesophageal reflux disease)   . Glaucoma   . Hearing loss in left ear   . History of kidney stones   . Hyperlipidemia    no per pt  . Hypertension   . Menopausal syndrome   . NASH (nonalcoholic steatohepatitis)    with cirrhosis and mild esoph varicies  . Primary hypothyroidism    Past Surgical History:  Procedure Laterality Date  . ABDOMINAL HYSTERECTOMY     partial ? prolapse  . BREAST LUMPECTOMY WITH RADIOACTIVE SEED AND SENTINEL LYMPH NODE BIOPSY Left 07/12/2018   Procedure: LEFT BREAST LUMPECTOMY WITH RADIOACTIVE SEED AND LEFT AXILLARY SENTINEL LYMPH NODE BIOPSY;  Surgeon: WRolm Bookbinder MD;  [facility]  Service: General;  Laterality: Left;  . CHOLECYSTECTOMY    . ESOPHAGOGASTRODUODENOSCOPY    . TONSILLECTOMY     Family History  Problem Relation Age of Onset  . Heart disease Mother        CAD  . Hypertension Mother   . Glaucoma Mother   . Heart disease Father  CAD  . Diabetes Father   . Hypertension Father   . Cancer Father        ? CA - squamous cell carcinoma  . Colon cancer Neg Hx   . Esophageal cancer Neg Hx   . Rectal cancer Neg Hx   . Stomach cancer Neg Hx    Social History   Socioeconomic History  . Marital status: Married    Spouse name: Dwaine Deter. Kruzel  . Number of children: Not on file  . Years of education: Not on file  . Highest education level: Not on file  Occupational History  . Not on file  Tobacco Use  . Smoking status: Former Smoker    Quit date: 06/06/1982    Years since quitting: 37.1  . Smokeless tobacco: Never Used  Substance and  Sexual Activity  . Alcohol use: Yes    Alcohol/week: 0.0 standard drinks    Comment: wine- rare  . Drug use: No  . Sexual activity: Not on file  Other Topics Concern  . Not on file  Social History Narrative  . Not on file   Social Determinants of Health   Financial Resource Strain: Low Risk   . Difficulty of Paying Living Expenses: Not hard at all  Food Insecurity: No Food Insecurity  . Worried About Charity fundraiser in the Last Year: Never true  . Ran Out of Food in the Last Year: Never true  Transportation Needs: No Transportation Needs  . Lack of Transportation (Medical): No  . Lack of Transportation (Non-Medical): No  Physical Activity: Insufficiently Active  . Days of Exercise per Week: 4 days  . Minutes of Exercise per Session: 30 min  Stress: No Stress Concern Present  . Feeling of Stress : Not at all  Social Connections:   . Frequency of Communication with Friends and Family: Not on file  . Frequency of Social Gatherings with Friends and Family: Not on file  . Attends Religious Services: Not on file  . Active Member of Clubs or Organizations: Not on file  . Attends Archivist Meetings: Not on file  . Marital Status: Not on file    Outpatient Encounter Medications as of 08/08/2019  Medication Sig  . anastrozole (ARIMIDEX) 1 MG tablet Take 1 tablet (1 mg total) by mouth daily.  . Biotin 2500 MCG CAPS Take 1 capsule by mouth daily. 5,000 mcg  . Cholecalciferol (VITAMIN D3) 2000 units TABS Take 1 tablet by mouth daily.  Marland Kitchen levothyroxine (SYNTHROID, LEVOTHROID) 175 MCG tablet Take 1 tablet (175 mcg total) by mouth daily before breakfast.  . loratadine (CLARITIN) 10 MG tablet Take 10 mg by mouth daily as needed.    . metoprolol succinate (TOPROL-XL) 100 MG 24 hr tablet Take with or immediately following a meal.  . Multiple Vitamin (MULTIVITAMIN) capsule Take 1 capsule by mouth daily.    . potassium chloride SA (K-DUR,KLOR-CON) 20 MEQ tablet Take 1 tablet by  mouth every day  . triamterene-hydrochlorothiazide (MAXZIDE-25) 37.5-25 MG tablet Take 1 tablet by mouth daily.   No facility-administered encounter medications on file as of 08/08/2019.    Activities of Daily Living In your present state of health, do you have any difficulty performing the following activities: 08/08/2019  Hearing? N  Vision? N  Difficulty concentrating or making decisions? N  Walking or climbing stairs? N  Dressing or bathing? N  Doing errands, shopping? N  Preparing Food and eating ? N  Using the Toilet? N  In the past six months, have you accidently leaked urine? N  Do you have problems with loss of bowel control? N  Managing your Medications? N  Managing your Finances? N  Housekeeping or managing your Housekeeping? N  Some recent data might be hidden    Patient Care Team: Tower, [name], MD as PCP - General Vin-Parikh, Deirdre Peer, MD as Referring Physician (Ophthalmology) Glennie Isle, PA-C as Physician Assistant (Physician Assistant) Magrinat, Virgie Dad, MD as Consulting Physician (Oncology) Rolm Bookbinder, MD as Consulting Physician (General Surgery) Kyung Rudd, MD as Consulting Physician (Radiation Oncology) Druscilla Brownie, MD as Referring Physician (Dermatology) Mauro Kaufmann, RN as Oncology Nurse Navigator Rockwell Germany, RN as Oncology Nurse Navigator    Assessment:   This is a routine wellness examination for [place].  Exercise Activities and Dietary recommendations Current Exercise Habits: Home exercise routine, Type of exercise: walking, Time (Minutes): 30, Frequency (Times/Week): 4, Weekly Exercise (Minutes/Week): 120, Intensity: Moderate, Exercise limited by: None identified  Goals    . Increase physical activity     Starting 07/21/16, I will continue to walk at least 30 min daily as weather permits.     . Increase physical activity     Starting 07/24/2017, I will continue to walk 20-30 minutes daily.     . Patient Stated      08/08/2019, I will continue to walk 4 days a week for 30 minutes.        Fall Risk Fall Risk  08/08/2019 08/03/2018 07/24/2017 07/21/2016 07/21/2015  Falls in the past year? 1 0 No No No  Comment tripped in the yard - - - -  Number falls in past yr: 0 0 - - -  Injury with Fall? 0 - - - -  Risk for fall due to : Medication side effect - - - -  Follow up Falls evaluation completed;Falls prevention discussed - - - -   Is the patient's home free of loose throw rugs in walkways, pet beds, electrical cords, etc?   yes      Grab bars in the bathroom? yes      Handrails on the stairs?   yes      Adequate lighting?   yes  Timed Get Up and Go performed: N/A  Depression Screen PHQ 2/9 Scores 08/08/2019 08/03/2018 07/24/2017 07/21/2016  PHQ - 2 Score 0 0 0 0  PHQ- 9 Score 0 - 0 -     Cognitive Function MMSE - Mini Mental State Exam 08/08/2019 07/24/2017 07/21/2016  Orientation to time 5 5 5   Orientation to Place 5 5 5   Registration 3 3 3   Attention/ Calculation 5 0 0  Recall 3 3 3   Language- name 2 objects - 0 0  Language- repeat 1 1 1   Language- follow 3 step command - 3 3  Language- read & follow direction - 0 0  Write a sentence - 0 0  Copy design - 0 0  Total score - 20 20  Mini Cog  Mini-Cog screen was completed. Maximum score is 22. A value of 0 denotes this part of the MMSE was not completed or the patient failed this part of the Mini-Cog screening.       Immunization History  Administered Date(s) Administered  . Hep A / Hep B 10/25/2012, 11/01/2012, 11/26/2012, 10/29/2013  . Influenza Split 03/03/2011  . Influenza Whole 03/16/2007, 03/10/2008, 03/18/2009, 03/31/2010  . Influenza, High Dose Seasonal PF 04/05/2018, 03/20/2019  . Influenza-Unspecified 03/07/2014, 04/03/2015,  03/14/2016, 03/22/2017  . PFIZER SARS-COV-2 Vaccination 07/13/2019, 08/07/2019  . Pneumococcal Conjugate-13 07/18/2014  . Pneumococcal Polysaccharide-23 10/25/2012, 07/26/2016  . Td 06/06/1996, 10/23/2006,  07/26/2017  . Zoster 11/03/2009  . Zoster Recombinat (Shingrix) 08/23/2017, 10/26/2017    Qualifies for Shingles Vaccine: Completed series  Screening Tests Health Maintenance  Topic Date Due  . MAMMOGRAM  03/05/2020  . COLONOSCOPY  04/15/2021  . TETANUS/TDAP  07/27/2027  . INFLUENZA VACCINE  Completed  . DEXA SCAN  Completed  . Hepatitis C Screening  Completed  . PNA vac Low Risk Adult  Completed    Cancer Screenings: Lung: Low Dose CT Chest recommended if Age 41-80 years, 30 pack-year currently smoking OR have quit w/in 15 years. Patient does not qualify. Breast:  Up to date on Mammogram: Yes, completed 03/06/2019   Up to date of Bone Density/Dexa: No, will have this scheduled when she does her mammogram this year.  Colorectal: completed 04/15/2016  Additional Screenings:  Hepatitis C Screening: 10/23/2012     Plan:    Patient will continue to walk 4 days a week for 30 minutes.    I have personally reviewed and noted the following in the patient's chart:   . Medical and social history . Use of alcohol, tobacco or illicit drugs  . Current medications and supplements . Functional ability and status . Nutritional status . Physical activity . Advanced directives . List of other physicians . Hospitalizations, surgeries, and ER visits in previous 12 months . Vitals . Screenings to include cognitive, depression, and falls . Referrals and appointments  In addition, I have reviewed and discussed with patient certain preventive protocols, quality metrics, and best practice recommendations. A written personalized care plan for preventive services as well as general preventive health recommendations were provided to patient.     Andrez Grime, LPN  06/14/3788

## 2019-08-08 NOTE — Telephone Encounter (Signed)
-----   Message from Cloyd Stagers, RT sent at 08/01/2019  9:23 AM EST ----- Regarding: Lab Orders for Thursday 3.4.2021 Please place lab orders for Thursday 3.4.2021, office visit for physical on Thursday 3.11.2021  Thank you, Dyke Maes RT(R)

## 2019-08-08 NOTE — Patient Instructions (Signed)
Selena Johnson , Thank you for taking time to come for your Medicare Wellness Visit. I appreciate your ongoing commitment to your health goals. Please review the following plan we discussed and let me know if I can assist you in the future.   Screening recommendations/referrals: Colonoscopy: Up to date, completed 04/15/2016 Mammogram: Up to date, completed 03/06/2019 Bone Density: will schedule with mammogram this year Recommended yearly ophthalmology/optometry visit for glaucoma screening and checkup Recommended yearly dental visit for hygiene and checkup  Vaccinations: Influenza vaccine: Up to date, completed 03/20/2019 Pneumococcal vaccine: Completed series Tdap vaccine: Up to date, completed 07/26/2017 Shingles vaccine: Completed series    Advanced directives: Please bring a copy of your POA (Power of [place]) and/or Living Will to your next appointment.   Conditions/risks identified: hypertension, hyperlipidemia  Next appointment: 08/15/2019 @ 9:30 am    Preventive Care 65 Years and Older, Female Preventive care refers to lifestyle choices and visits with your health care provider that can promote health and wellness. What does preventive care include?  A yearly physical exam. This is also called an annual well check.  Dental exams once or twice a year.  Routine eye exams. Ask your health care provider how often you should have your eyes checked.  Personal lifestyle choices, including:  Daily care of your teeth and gums.  Regular physical activity.  Eating a healthy diet.  Avoiding tobacco and drug use.  Limiting alcohol use.  Practicing safe sex.  Taking low-dose aspirin every day.  Taking vitamin and mineral supplements as recommended by your health care provider. What happens during an annual well check? The services and screenings done by your health care provider during your annual well check will depend on your age, overall health, lifestyle risk factors, and  family history of disease. Counseling  Your health care provider may ask you questions about your:  Alcohol use.  Tobacco use.  Drug use.  Emotional well-being.  Home and relationship well-being.  Sexual activity.  Eating habits.  History of falls.  Memory and ability to understand (cognition).  Work and work Statistician.  Reproductive health. Screening  You may have the following tests or measurements:  Height, weight, and BMI.  Blood pressure.  Lipid and cholesterol levels. These may be checked every 5 years, or more frequently if you are over 34 years old.  Skin check.  Lung cancer screening. You may have this screening every year starting at age 50 if you have a 30-pack-year history of smoking and currently smoke or have quit within the past 15 years.  Fecal occult blood test (FOBT) of the stool. You may have this test every year starting at age 78.  Flexible sigmoidoscopy or colonoscopy. You may have a sigmoidoscopy every 5 years or a colonoscopy every 10 years starting at age 65.  Hepatitis C blood test.  Hepatitis B blood test.  Sexually transmitted disease (STD) testing.  Diabetes screening. This is done by checking your blood sugar (glucose) after you have not eaten for a while (fasting). You may have this done every 1-3 years.  Bone density scan. This is done to screen for osteoporosis. You may have this done starting at age 67.  Mammogram. This may be done every 1-2 years. Talk to your health care provider about how often you should have regular mammograms. Talk with your health care provider about your test results, treatment options, and if necessary, the need for more tests. Vaccines  Your health care provider may recommend certain vaccines,  such as:  Influenza vaccine. This is recommended every year.  Tetanus, diphtheria, and acellular pertussis (Tdap, Td) vaccine. You may need a Td booster every 10 years.  Zoster vaccine. You may need this  after age 4.  Pneumococcal 13-valent conjugate (PCV13) vaccine. One dose is recommended after age 17.  Pneumococcal polysaccharide (PPSV23) vaccine. One dose is recommended after age 54. Talk to your health care provider about which screenings and vaccines you need and how often you need them. This information is not intended to replace advice given to you by your health care provider. Make sure you discuss any questions you have with your health care provider. Document Released: 06/19/2015 Document Revised: 02/10/2016 Document Reviewed: 03/24/2015 Elsevier Interactive Patient Education  2017 [place] Prevention in the Home Falls can cause injuries. They can happen to people of all ages. There are many things you can do to make your home safe and to help prevent falls. What can I do on the outside of my home?  Regularly fix the edges of walkways and driveways and fix any cracks.  Remove anything that might make you trip as you walk through a door, such as a raised step or threshold.  Trim any bushes or trees on the path to your home.  Use bright outdoor lighting.  Clear any walking paths of anything that might make someone trip, such as rocks or tools.  Regularly check to see if handrails are loose or broken. Make sure that both sides of any steps have handrails.  Any raised decks and porches should have guardrails on the edges.  Have any leaves, snow, or ice cleared regularly.  Use sand or salt on walking paths during winter.  Clean up any spills in your garage right away. This includes oil or grease spills. What can I do in the bathroom?  Use night lights.  Install grab bars by the toilet and in the tub and shower. Do not use towel bars as grab bars.  Use non-skid mats or decals in the tub or shower.  If you need to sit down in the shower, use a plastic, non-slip stool.  Keep the floor dry. Clean up any water that spills on the floor as soon as it  happens.  Remove soap buildup in the tub or shower regularly.  Attach bath mats securely with double-sided non-slip rug tape.  Do not have throw rugs and other things on the floor that can make you trip. What can I do in the bedroom?  Use night lights.  Make sure that you have a light by your bed that is easy to reach.  Do not use any sheets or blankets that are too big for your bed. They should not hang down onto the floor.  Have a firm chair that has side arms. You can use this for support while you get dressed.  Do not have throw rugs and other things on the floor that can make you trip. What can I do in the kitchen?  Clean up any spills right away.  Avoid walking on wet floors.  Keep items that you use a lot in easy-to-reach places.  If you need to reach something above you, use a strong step stool that has a grab bar.  Keep electrical cords out of the way.  Do not use floor polish or wax that makes floors slippery. If you must use wax, use non-skid floor wax.  Do not have throw rugs and other things on  the floor that can make you trip. What can I do with my stairs?  Do not leave any items on the stairs.  Make sure that there are handrails on both sides of the stairs and use them. Fix handrails that are broken or loose. Make sure that handrails are as long as the stairways.  Check any carpeting to make sure that it is firmly attached to the stairs. Fix any carpet that is loose or worn.  Avoid having throw rugs at the top or bottom of the stairs. If you do have throw rugs, attach them to the floor with carpet tape.  Make sure that you have a light switch at the top of the stairs and the bottom of the stairs. If you do not have them, ask someone to add them for you. What else can I do to help prevent falls?  Wear shoes that:  Do not have high heels.  Have rubber bottoms.  Are comfortable and fit you well.  Are closed at the toe. Do not wear sandals.  If you  use a stepladder:  Make sure that it is fully opened. Do not climb a closed stepladder.  Make sure that both sides of the stepladder are locked into place.  Ask someone to hold it for you, if possible.  Clearly mark and make sure that you can see:  Any grab bars or handrails.  First and last steps.  Where the edge of each step is.  Use tools that help you move around (mobility aids) if they are needed. These include:  Canes.  Walkers.  Scooters.  Crutches.  Turn on the lights when you go into a dark area. Replace any light bulbs as soon as they burn out.  Set up your furniture so you have a clear path. Avoid moving your furniture around.  If any of your floors are uneven, fix them.  If there are any pets around you, be aware of where they are.  Review your medicines with your doctor. Some medicines can make you feel dizzy. This can increase your chance of falling. Ask your doctor what other things that you can do to help prevent falls. This information is not intended to replace advice given to you by your health care provider. Make sure you discuss any questions you have with your health care provider. Document Released: 03/19/2009 Document Revised: 10/29/2015 Document Reviewed: 06/27/2014 Elsevier Interactive Patient Education  2017 [employer].

## 2019-08-15 ENCOUNTER — Other Ambulatory Visit: Payer: Self-pay

## 2019-08-15 ENCOUNTER — Encounter: Payer: Medicare Other | Admitting: Family Medicine

## 2019-08-29 ENCOUNTER — Other Ambulatory Visit: Payer: Self-pay

## 2019-08-29 ENCOUNTER — Encounter: Payer: Self-pay | Admitting: Family Medicine

## 2019-08-29 ENCOUNTER — Ambulatory Visit (INDEPENDENT_AMBULATORY_CARE_PROVIDER_SITE_OTHER): Payer: Medicare Other | Admitting: Family Medicine

## 2019-08-29 VITALS — BP 126/74 | HR 74 | Temp 97.4°F | Ht 65.0 in | Wt 210.1 lb

## 2019-08-29 DIAGNOSIS — R7309 Other abnormal glucose: Secondary | ICD-10-CM | POA: Diagnosis not present

## 2019-08-29 DIAGNOSIS — K7460 Unspecified cirrhosis of liver: Secondary | ICD-10-CM | POA: Diagnosis not present

## 2019-08-29 DIAGNOSIS — I10 Essential (primary) hypertension: Secondary | ICD-10-CM | POA: Diagnosis not present

## 2019-08-29 DIAGNOSIS — E039 Hypothyroidism, unspecified: Secondary | ICD-10-CM | POA: Diagnosis not present

## 2019-08-29 DIAGNOSIS — E7800 Pure hypercholesterolemia, unspecified: Secondary | ICD-10-CM

## 2019-08-29 DIAGNOSIS — Z6835 Body mass index (BMI) 35.0-35.9, adult: Secondary | ICD-10-CM

## 2019-08-29 DIAGNOSIS — D696 Thrombocytopenia, unspecified: Secondary | ICD-10-CM | POA: Diagnosis not present

## 2019-08-29 MED ORDER — LEVOTHYROXINE SODIUM 150 MCG PO TABS: 150.0000 ug | ORAL_TABLET | Freq: Every day | ORAL | 3 refills | Status: DC

## 2019-08-29 MED ORDER — TRIAMTERENE-HCTZ 37.5-25 MG PO TABS: 1.0000 | ORAL_TABLET | Freq: Every day | ORAL | 3 refills | Status: DC

## 2019-08-29 MED ORDER — POTASSIUM CHLORIDE CRYS ER 20 MEQ PO TBCR: EXTENDED_RELEASE_TABLET | ORAL | 3 refills | Status: DC

## 2019-08-29 MED ORDER — METOPROLOL SUCCINATE ER 100 MG PO TB24: ORAL_TABLET | ORAL | 3 refills | Status: DC

## 2019-08-29 NOTE — Assessment & Plan Note (Signed)
bp in fair control at this time  BP Readings from Last 1 Encounters:  08/29/19 126/74   No changes needed Most recent labs reviewed  Disc lifstyle change with low sodium diet and exercise  Doing well with maxzide, K , metorprolol

## 2019-08-29 NOTE — Assessment & Plan Note (Signed)
No problems currently  Lab Results  Component Value Date   HGBA1C 4.3 (L) 08/08/2019   disc imp of low glycemic diet and wt loss to prevent DM2

## 2019-08-29 NOTE — Assessment & Plan Note (Addendum)
Lab Results  Component Value Date   TSH 0.33 (L) 08/08/2019    This may be down due to arimidex Dec levothy dose to 150 mcg  She has had palpitations- hope this will take care of it  Re check tsh in 6 wk

## 2019-08-29 NOTE — Assessment & Plan Note (Signed)
Discussed how this problem influences overall health and the risks it imposes  Reviewed plan for weight loss with lower calorie diet (via better food choices and also portion control or program like weight watchers) and exercise building up to or more than 30 minutes 5 days per week including some aerobic activity    Will start home exercise

## 2019-08-29 NOTE — Assessment & Plan Note (Signed)
Stable  Disc goals for lipids and reasons to control them Rev last labs with pt Rev low sat fat diet in detail

## 2019-08-29 NOTE — Assessment & Plan Note (Signed)
Labs are stable  Continues to be followed by GI No recent varices

## 2019-08-29 NOTE — Assessment & Plan Note (Signed)
Pl ct is stable in 70s No excessive bruising or bleeding  Followed by heme/onc

## 2019-08-29 NOTE — Patient Instructions (Addendum)
About a month before your mammogram in sept - let us know so I can make an order for your dexa   Think about options to exercise at home  Videos or video streaming are great  Also you can invest in equipment  When weather is bad- you need another option   Go down on levothyroxine to 150 mcg  We will schedule labs in about 6 weeks   For cholesterol Avoid red meat/ fried foods/ egg yolks/ fatty breakfast meats/ butter, cheese and high fat dairy/ and shellfish

## 2019-08-29 NOTE — Progress Notes (Signed)
Subjective:    Patient ID: Selena Johnson, female    DOB: 05-Jul-1950, 69 y.o.   MRN: [id number]  This visit occurred during the SARS-CoV-2 public health emergency.  Safety protocols were in place, including screening questions prior to the visit, additional usage of staff PPE, and extensive cleaning of exam room while observing appropriate contact time as indicated for disinfecting solutions.    HPI Pt presents for annual f/u of chronic medical problems   Wt Readings from Last 3 Encounters:  08/29/19 210 lb 1.6 oz (95.3 kg)  08/05/19 212 lb 3.2 oz (96.3 kg)  02/04/19 214 lb 3.2 oz (97.2 kg)  ate a lot of carbs and was sedentary over winter  34.96 kg/m  Has been feeling ok overall   She leaned on something with ribs and is a little sore    Had amw on 3/4  Disc dexa -she will get when she gets her mammogram   Mammogram was 9/20  Next one is ordered  Personal hx of breast cancer  Taking arimidex - hot flashes /constipation and cannot sleep well  Self breast exam -no changes  Continues oncology f/u   Colonoscopy 11/17 with 5 y recall   dexa 2/17-bmd in the normal range [place] -none Fractures -none Supplements - takes ca and D  Exercise - did not do any in the winter / wants to get started    She had her shingrix vaccines Also covid vaccines   bp is stable today  No cp or palpitations or headaches or edema  No side effects to medicines  BP Readings from Last 3 Encounters:  08/29/19 126/74  08/05/19 131/70  02/04/19 (!) 149/70     Pulse Readings from Last 3 Encounters:  08/29/19 74  08/05/19 74  02/04/19 73    Hypothyroidism  Some palpitations and hair changes  Lab Results  Component Value Date   TSH 0.33 (L) 08/08/2019    This may have changed from arimidex    H/o elevated glucose in the past  Lab Results  Component Value Date   HGBA1C 4.3 (L) 08/08/2019    Hyperlipidemia Lab Results  Component Value Date   CHOL 202 (H) 08/08/2019   CHOL 214 (H)  07/27/2018   CHOL 212 (H) 07/24/2017   Lab Results  Component Value Date   HDL 64.80 08/08/2019   HDL 67.10 07/27/2018   HDL 59.80 07/24/2017   Lab Results  Component Value Date   LDLCALC 124 (H) 08/08/2019   LDLCALC 133 (H) 07/27/2018   LDLCALC 137 (H) 07/24/2017   Lab Results  Component Value Date   TRIG 66.0 08/08/2019   TRIG 69.0 07/27/2018   TRIG 75.0 07/24/2017   Lab Results  Component Value Date   CHOLHDL 3 08/08/2019   CHOLHDL 3 07/27/2018   CHOLHDL 4 07/24/2017   Lab Results  Component Value Date   LDLDIRECT 150.1 07/08/2013   LDLDIRECT 144.9 01/02/2013   LDLDIRECT 139.8 07/06/2012  fairly stable  Eating more butter    H/o NASH with cirrhosis  Lab Results  Component Value Date   ALT 26 08/08/2019   AST 45 (H) 08/08/2019   ALKPHOS 138 (H) 08/08/2019   BILITOT 1.9 (H) 08/08/2019  no esophageal varicies right now    H/o thrombocytopenia Lab Results  Component Value Date   WBC 5.0 08/08/2019   HGB 14.4 08/08/2019   HCT 41.9 08/08/2019   MCV 103.7 (H) 08/08/2019   PLT 78.0 (L) 08/08/2019  Patient Active Problem List   Diagnosis Date Noted  . Malignant neoplasm of upper-inner quadrant of left breast in female, estrogen receptor positive ([place]) 06/27/2018  . Left breast mass 06/17/2018  . Elevated random blood glucose level 07/28/2017  . Skin cancer screening 05/05/2017  . Colon cancer screening 07/21/2015  . Estrogen deficiency 07/21/2015  . Calcification of right breast 05/06/2015  . Encounter for Medicare annual wellness exam 07/20/2014  . Unspecified constipation 10/25/2012  . Hepatic cirrhosis ([place]) 10/23/2012  . Hypokalemia 07/13/2012  . Thrombocytopenia ([place]) 11/10/2010  . Routine general medical examination at a health care facility 11/04/2010  . Hyperlipidemia 03/18/2008  . Obesity 03/18/2008  . TRANSAMINASES, SERUM, ELEVATED 03/18/2008  . MENOPAUSE-RELATED VASOMOTOR SYMPTOMS, HOT FLASHES 10/23/2006  . Hypothyroidism 10/11/2006    . GLAUCOMA 10/11/2006  . Essential hypertension 10/11/2006  . GERD 10/11/2006  . History of kidney stones 10/11/2006   Past Medical History:  Diagnosis Date  . Allergy   . Cancer ([place])    basal cell skin CA  . Chronic kidney disease    hx of kidney stones  . Conjunctivitis    chronic  . Fatty liver 2015  . GERD (gastroesophageal reflux disease)   . Glaucoma   . Hearing loss in left ear   . History of kidney stones   . Hyperlipidemia    no per pt  . Hypertension   . Menopausal syndrome   . NASH (nonalcoholic steatohepatitis)    with cirrhosis and mild esoph varicies  . Primary hypothyroidism    Past Surgical History:  Procedure Laterality Date  . ABDOMINAL HYSTERECTOMY     partial ? prolapse  . BREAST LUMPECTOMY WITH RADIOACTIVE SEED AND SENTINEL LYMPH NODE BIOPSY Left 07/12/2018   Procedure: LEFT BREAST LUMPECTOMY WITH RADIOACTIVE SEED AND LEFT AXILLARY SENTINEL LYMPH NODE BIOPSY;  Surgeon: Rolm Bookbinder, MD;  Location: [facility];  Service: General;  Laterality: Left;  . CHOLECYSTECTOMY    . ESOPHAGOGASTRODUODENOSCOPY    . TONSILLECTOMY     Social History   Tobacco Use  . Smoking status: Former Smoker    Quit date: 06/06/1982    Years since quitting: 37.2  . Smokeless tobacco: Never Used  Substance Use Topics  . Alcohol use: Yes    Alcohol/week: 0.0 standard drinks    Comment: wine- rare  . Drug use: No   Family History  Problem Relation Age of Onset  . Heart disease Mother        CAD  . Hypertension Mother   . Glaucoma Mother   . Heart disease Father        CAD  . Diabetes Father   . Hypertension Father   . Cancer Father        ? CA - squamous cell carcinoma  . Colon cancer Neg Hx   . Esophageal cancer Neg Hx   . Rectal cancer Neg Hx   . Stomach cancer Neg Hx    Allergies  Allergen Reactions  . Codeine     REACTION: Throat swelling  . Ivp Dye [Iodinated Diagnostic Agents] Swelling   Current Outpatient Medications on File Prior to Visit   Medication Sig Dispense Refill  . anastrozole (ARIMIDEX) 1 MG tablet Take 1 tablet (1 mg total) by mouth daily. 90 tablet 4  . Biotin 2500 MCG CAPS Take 1 capsule by mouth daily. 5,000 mcg    . Cholecalciferol (VITAMIN D3) 2000 units TABS Take 1 tablet by mouth daily.    Marland Kitchen loratadine (CLARITIN) 10  MG tablet Take 10 mg by mouth daily as needed.      . Multiple Vitamin (MULTIVITAMIN) capsule Take 1 capsule by mouth daily.       No current facility-administered medications on file prior to visit.     Review of Systems  Constitutional: Negative for activity change, appetite change, fatigue, fever and unexpected weight change.  HENT: Negative for congestion, ear pain, rhinorrhea, sinus pressure and sore throat.   Eyes: Negative for pain, redness and visual disturbance.  Respiratory: Negative for cough, shortness of breath and wheezing.        Ribs are sore after injury (mild)   Cardiovascular: Negative for chest pain and palpitations.  Gastrointestinal: Negative for abdominal pain, blood in stool, constipation and diarrhea.  Endocrine: Negative for polydipsia and polyuria.  Genitourinary: Negative for dysuria, frequency and urgency.  Musculoskeletal: Negative for arthralgias, back pain and myalgias.  Skin: Negative for pallor and rash.  Allergic/Immunologic: Negative for environmental allergies.  Neurological: Negative for dizziness, syncope and headaches.  Hematological: Negative for adenopathy. Does not bruise/bleed easily.  Psychiatric/Behavioral: Negative for decreased concentration and dysphoric mood. The patient is not nervous/anxious.        Objective:   Physical Exam Constitutional:      General: She is not in acute distress.    Appearance: Normal appearance. She is well-developed. She is obese. She is not ill-appearing or diaphoretic.  HENT:     Head: Normocephalic and atraumatic.     Right Ear: Tympanic membrane, ear canal and external ear normal.     Left Ear: Tympanic  membrane, ear canal and external ear normal.     Nose: Nose normal. No congestion.     Mouth/Throat:     Mouth: Mucous membranes are moist.     Pharynx: Oropharynx is clear. No posterior oropharyngeal erythema.  Eyes:     General: No scleral icterus.    Extraocular Movements: Extraocular movements intact.     Conjunctiva/sclera: Conjunctivae normal.     Pupils: Pupils are equal, round, and reactive to light.  Neck:     Thyroid: No thyromegaly.     Vascular: No carotid bruit or JVD.  Cardiovascular:     Rate and Rhythm: Normal rate and regular rhythm.     Pulses: Normal pulses.     Heart sounds: Normal heart sounds. No gallop.   Pulmonary:     Effort: Pulmonary effort is normal. No respiratory distress.     Breath sounds: Normal breath sounds. No wheezing.     Comments: Good air exch Chest:     Chest wall: No tenderness.  Abdominal:     General: Bowel sounds are normal. There is no distension or abdominal bruit.     Palpations: Abdomen is soft. There is no mass.     Tenderness: There is no abdominal tenderness.     Hernia: No hernia is present.  Genitourinary:    Comments: Recent breast exam done by oncologist Musculoskeletal:        General: No tenderness. Normal range of motion.     Cervical back: Normal range of motion and neck supple. No rigidity. No muscular tenderness.     Right lower leg: No edema.     Left lower leg: No edema.     Comments: Mild ant L lower rib tenderness No crepitus   Lymphadenopathy:     Cervical: No cervical adenopathy.  Skin:    General: Skin is warm and dry.     Coloration: Skin is  not pale.     Findings: No erythema or rash.     Comments: Solar lentigines diffusely Some sks  Neurological:     Mental Status: She is alert. Mental status is at baseline.     Cranial Nerves: No cranial nerve deficit.     Motor: No abnormal muscle tone.     Coordination: Coordination normal.     Gait: Gait normal.     Deep Tendon Reflexes: Reflexes are normal  and symmetric. Reflexes normal.  Psychiatric:        Mood and Affect: Mood normal.        Cognition and Memory: Cognition and memory normal.           Assessment & Plan:   Problem List Items Addressed This Visit      Cardiovascular and Mediastinum   Essential hypertension - Primary    bp in fair control at this time  BP Readings from Last 1 Encounters:  08/29/19 126/74   No changes needed Most recent labs reviewed  Disc lifstyle change with low sodium diet and exercise  Doing well with maxzide, K , metorprolol      Relevant Medications   metoprolol succinate (TOPROL-XL) 100 MG 24 hr tablet   triamterene-hydrochlorothiazide (MAXZIDE-25) 37.5-25 MG tablet     Digestive   Hepatic cirrhosis ([place])    Labs are stable  Continues to be followed by GI No recent varices         Endocrine   Hypothyroidism    Lab Results  Component Value Date   TSH 0.33 (L) 08/08/2019    This may be down due to arimidex Dec levothy dose to 150 mcg  She has had palpitations- hope this will take care of it  Re check tsh in 6 wk      Relevant Medications   levothyroxine (SYNTHROID) 150 MCG tablet   metoprolol succinate (TOPROL-XL) 100 MG 24 hr tablet   Other Relevant Orders   TSH     Other   Hyperlipidemia    Stable  Disc goals for lipids and reasons to control them Rev last labs with pt Rev low sat fat diet in detail       Relevant Medications   metoprolol succinate (TOPROL-XL) 100 MG 24 hr tablet   triamterene-hydrochlorothiazide (MAXZIDE-25) 37.5-25 MG tablet   Obesity    Discussed how this problem influences overall health and the risks it imposes  Reviewed plan for weight loss with lower calorie diet (via better food choices and also portion control or program like weight watchers) and exercise building up to or more than 30 minutes 5 days per week including some aerobic activity    Will start home exercise      Thrombocytopenia ([facility])    Pl ct is stable in 70s No  excessive bruising or bleeding  Followed by heme/onc      Elevated random blood glucose level    No problems currently  Lab Results  Component Value Date   HGBA1C 4.3 (L) 08/08/2019   disc imp of low glycemic diet and wt loss to prevent DM2

## 2019-09-02 ENCOUNTER — Telehealth: Payer: Self-pay | Admitting: *Deleted

## 2019-09-02 MED ORDER — METOPROLOL SUCCINATE ER 100 MG PO TB24: ORAL_TABLET | ORAL | 3 refills | Status: DC

## 2019-09-02 NOTE — Telephone Encounter (Signed)
I sent it  

## 2019-09-02 NOTE — Telephone Encounter (Signed)
Selena Johnson with [facility] left a voicemail stating that they received a script for Metoprolol succinate ER 100 mg. Selena Johnson stated that the directions are missing. Selena Johnson requested a call back at [phone] Ref # [id number]. Selena Johnson said if you prefer, a new script can be sent in electtronically with the directions. If the script is called in the caller needs to leave their first and last name because that is a requirement in the state of [place].

## 2019-10-04 ENCOUNTER — Telehealth: Payer: Self-pay | Admitting: Family Medicine

## 2019-10-04 NOTE — Progress Notes (Signed)
  Chronic Care Management   Outreach Note  10/04/2019 Name: Selena Johnson MRN: [id number] DOB: June 12, 1950  Referred by: Tower, [name], MD Reason for referral : No chief complaint on file.   An unsuccessful telephone outreach was attempted today. The patient was referred to the pharmacist for assistance with care management and care coordination.    This note is not being shared with the patient for the following reason: To respect privacy (The patient or proxy has requested that the information not be shared).  Follow Up Plan:   Raynicia Dukes [employer] Scheduler

## 2019-10-11 ENCOUNTER — Other Ambulatory Visit: Payer: Self-pay

## 2019-10-11 ENCOUNTER — Other Ambulatory Visit (INDEPENDENT_AMBULATORY_CARE_PROVIDER_SITE_OTHER): Payer: Medicare Other

## 2019-10-11 DIAGNOSIS — E039 Hypothyroidism, unspecified: Secondary | ICD-10-CM | POA: Diagnosis not present

## 2019-10-11 LAB — TSH: TSH: 1.54 u[IU]/mL (ref 0.35–4.50)

## 2020-01-22 DIAGNOSIS — H2513 Age-related nuclear cataract, bilateral: Secondary | ICD-10-CM | POA: Diagnosis not present

## 2020-01-22 DIAGNOSIS — H40003 Preglaucoma, unspecified, bilateral: Secondary | ICD-10-CM | POA: Diagnosis not present

## 2020-03-04 DIAGNOSIS — Z23 Encounter for immunization: Secondary | ICD-10-CM | POA: Diagnosis not present

## 2020-03-11 ENCOUNTER — Encounter: Payer: Self-pay | Admitting: Family Medicine

## 2020-03-17 ENCOUNTER — Inpatient Hospital Stay: Payer: Medicare Other

## 2020-03-17 ENCOUNTER — Other Ambulatory Visit: Payer: Self-pay

## 2020-03-17 ENCOUNTER — Inpatient Hospital Stay: Payer: Medicare Other | Attending: Oncology | Admitting: Oncology

## 2020-03-17 VITALS — BP 162/66 | HR 67 | Temp 97.4°F | Resp 18 | Ht 65.0 in | Wt 215.4 lb

## 2020-03-17 DIAGNOSIS — C50212 Malignant neoplasm of upper-inner quadrant of left female breast: Secondary | ICD-10-CM

## 2020-03-17 DIAGNOSIS — Z87891 Personal history of nicotine dependence: Secondary | ICD-10-CM | POA: Diagnosis not present

## 2020-03-17 DIAGNOSIS — Z79811 Long term (current) use of aromatase inhibitors: Secondary | ICD-10-CM | POA: Insufficient documentation

## 2020-03-17 DIAGNOSIS — I129 Hypertensive chronic kidney disease with stage 1 through stage 4 chronic kidney disease, or unspecified chronic kidney disease: Secondary | ICD-10-CM | POA: Diagnosis not present

## 2020-03-17 DIAGNOSIS — Z79899 Other long term (current) drug therapy: Secondary | ICD-10-CM | POA: Diagnosis not present

## 2020-03-17 DIAGNOSIS — C50812 Malignant neoplasm of overlapping sites of left female breast: Secondary | ICD-10-CM | POA: Insufficient documentation

## 2020-03-17 DIAGNOSIS — Z923 Personal history of irradiation: Secondary | ICD-10-CM | POA: Diagnosis not present

## 2020-03-17 DIAGNOSIS — Z170 Estrogen receptor positive status [ER+]: Secondary | ICD-10-CM | POA: Diagnosis not present

## 2020-03-17 DIAGNOSIS — N189 Chronic kidney disease, unspecified: Secondary | ICD-10-CM | POA: Insufficient documentation

## 2020-03-17 DIAGNOSIS — E039 Hypothyroidism, unspecified: Secondary | ICD-10-CM | POA: Diagnosis not present

## 2020-03-17 LAB — CBC WITH DIFFERENTIAL/PLATELET
Abs Immature Granulocytes: 0.01 10*3/uL (ref 0.00–0.07)
Basophils Absolute: 0 10*3/uL (ref 0.0–0.1)
Basophils Relative: 0 %
Eosinophils Absolute: 0.5 10*3/uL (ref 0.0–0.5)
Eosinophils Relative: 11 %
HCT: 41.3 % (ref 36.0–46.0)
Hemoglobin: 14.4 g/dL (ref 12.0–15.0)
Immature Granulocytes: 0 %
Lymphocytes Relative: 19 %
Lymphs Abs: 0.9 10*3/uL (ref 0.7–4.0)
MCH: 35.9 pg — ABNORMAL HIGH (ref 26.0–34.0)
MCHC: 34.9 g/dL (ref 30.0–36.0)
MCV: 103 fL — ABNORMAL HIGH (ref 80.0–100.0)
Monocytes Absolute: 0.4 10*3/uL (ref 0.1–1.0)
Monocytes Relative: 10 %
Neutro Abs: 2.8 10*3/uL (ref 1.7–7.7)
Neutrophils Relative %: 60 %
Platelets: 65 10*3/uL — ABNORMAL LOW (ref 150–400)
RBC: 4.01 MIL/uL (ref 3.87–5.11)
RDW: 13 % (ref 11.5–15.5)
WBC: 4.6 10*3/uL (ref 4.0–10.5)
nRBC: 0 % (ref 0.0–0.2)

## 2020-03-17 LAB — COMPREHENSIVE METABOLIC PANEL
ALT: 30 U/L (ref 0–44)
AST: 54 U/L — ABNORMAL HIGH (ref 15–41)
Albumin: 3.6 g/dL (ref 3.5–5.0)
Alkaline Phosphatase: 148 U/L — ABNORMAL HIGH (ref 38–126)
Anion gap: 4 — ABNORMAL LOW (ref 5–15)
BUN: 6 mg/dL — ABNORMAL LOW (ref 8–23)
CO2: 33 mmol/L — ABNORMAL HIGH (ref 22–32)
Calcium: 9.7 mg/dL (ref 8.9–10.3)
Chloride: 105 mmol/L (ref 98–111)
Creatinine, Ser: 0.66 mg/dL (ref 0.44–1.00)
GFR, Estimated: >60 mL/min (ref >60)
Glucose, Bld: 99 mg/dL (ref 70–99)
Potassium: 4.3 mmol/L (ref 3.5–5.1)
Sodium: 142 mmol/L (ref 135–145)
Total Bilirubin: 1.8 mg/dL — ABNORMAL HIGH (ref 0.3–1.2)
Total Protein: 6.7 g/dL (ref 6.5–8.1)

## 2020-03-17 NOTE — Progress Notes (Signed)
[place]  Telephone:(336) [phone] [phone]     ID: ZARIEL CAPANO DOB: [birth year]  MR#: [id number]  KYH#:[id number]  Patient Care Team: Abner Greenspan, MD as PCP - General Vin-Parikh, Deirdre Peer, MD as Referring Physician (Ophthalmology) Glennie Isle, PA-C as Physician Assistant (Physician Assistant) Magdalena Skilton, Virgie Dad, MD as Consulting Physician (Oncology) Rolm Bookbinder, MD as Consulting Physician (General Surgery) Kyung Rudd, MD as Consulting Physician (Radiation Oncology) Druscilla Brownie, MD as Referring Physician (Dermatology) Mauro Kaufmann, RN as Oncology Nurse Navigator Rockwell Germany, RN as Oncology Nurse Navigator OTHER MD:    CHIEF COMPLAINT: Estrogen receptor positive breast cancer  CURRENT TREATMENT: Anastrozole   INTERVAL HISTORY: Makilah returns today for follow-up of her estrogen receptor positive breast cancer.  She continues on anastrozole.  She tolerates this well with no unusual side effects.  Dynisha's last bone density screening on 07/30/2015, showed a T-score of -0.3, which is considered normal.  Repeat 03/11/2020 showed a T score of -0.1, which is normal.  Since her last visit, she underwent bilateral diagnostic mammography with tomography at [facility] on 03/11/2020 showing: breast density category C; no evidence of malignancy in either breast.    REVIEW OF SYSTEMS: Seraphim and her husband both had the Pfizer vaccine and will be receiving the booster next week.  She does a lot of house and yard work and cooking.  She does not otherwise exercise.  She unfortunately lost her twin brother 2 months ago to bladder cancer.  She has an unusual sensation on the soles of her feet where they feel a little bit numb at times.  This is very intermittent.  It appears to be read related to what shoes she is wearing or not wearing.  She has no problem on her hands.  A detailed review of systems today was otherwise stable   HISTORY OF  CURRENT ILLNESS: From the original intake note:  Jissel underwent routine screening mammography at [facility] on 02/28/2018 showing an irregularity in the left breast. On 03/02/2018, she followed up with a unilateral left diagnostic mammogram and unilateral left ultrasound where a biopsy was recommended. A biopsy was performed on 03/07/2018. Pathology from the procedure ([id number]) showed benign breast tissue with no specific histopathologic changes, negative for carcinoma. A three month follow up was recommended.  At her follow up, she underwent bilateral diagnostic mammography with tomography and left breast ultrasonography at [facility] on 06/12/2018 showing: Breast Density Category C. There os a 0.7 cm oval mass in the left breast at 9 o'clock posterior depth 9 cm from the nipple. There is a questioned mild architectural distortion associated with the mass. Biopsy clip is noted 1 cm medial and 1.7 cm inferior to the mass. No other significant masses or calcifications are seen in the breast. On ultrasound, there is a 0.9 x 0.7 cm oval mass with an angular margin in the left breast at 9:30 o'clock 8 cm from the nipple. Color flow imaging demonstrates that there is vascularity present. Elastography imaging assessment is soft. No definite biopsy clip is associated with this mass. This mass corresponds to the mammographic mass. No significant abnormalities were seen sonographically in the left axilla.   Accordingly on 06/20/2018 she proceeded to biopsy of the left breast area in question. The pathology from this procedure showed ([id number]): invasive ductal carcinoma, grade I. Prognostic indicators significant for: estrogen receptor, 100% positive and progesterone receptor, 20% positive, both with strong staining intensity. Proliferation marker Ki67 at 3%. HER2 negative (1+)  by immunohistochemistry.  The patient's subsequent history is as detailed below.   PAST MEDICAL HISTORY: Past Medical History:  Diagnosis  Date  . Allergy   . Cancer ([place])    basal cell skin CA  . Chronic kidney disease    hx of kidney stones  . Conjunctivitis    chronic  . Fatty liver 2015  . GERD (gastroesophageal reflux disease)   . Glaucoma   . Hearing loss in left ear   . History of kidney stones   . Hyperlipidemia    no per pt  . Hypertension   . Menopausal syndrome   . NASH (nonalcoholic steatohepatitis)    with cirrhosis and mild esoph varicies  . Primary hypothyroidism     PAST SURGICAL HISTORY: Past Surgical History:  Procedure Laterality Date  . ABDOMINAL HYSTERECTOMY     partial ? prolapse  . BREAST LUMPECTOMY WITH RADIOACTIVE SEED AND SENTINEL LYMPH NODE BIOPSY Left 07/12/2018   Procedure: LEFT BREAST LUMPECTOMY WITH RADIOACTIVE SEED AND LEFT AXILLARY SENTINEL LYMPH NODE BIOPSY;  Surgeon: Rolm Bookbinder, MD;  Location: [place];  Service: General;  Laterality: Left;  . CHOLECYSTECTOMY    . ESOPHAGOGASTRODUODENOSCOPY    . TONSILLECTOMY      FAMILY HISTORY: Family History  Problem Relation Age of Onset  . Heart disease Mother        CAD  . Hypertension Mother   . Glaucoma Mother   . Heart disease Father        CAD  . Diabetes Father   . Hypertension Father   . Cancer Father        ? CA - squamous cell carcinoma  . Colon cancer Neg Hx   . Esophageal cancer Neg Hx   . Rectal cancer Neg Hx   . Stomach cancer Neg Hx    Avereigh's father died from squamous cell cancer of the lung at age 36. Patients' mother died from stroke and sepsis at age 71. The patient has 1 brother and 1 sister. Her brother has advanced renal cell CA and is being treated at [facility]. Patient denies anyone in her family having breast, ovarian, prostate, or pancreatic cancer.    GYNECOLOGIC HISTORY:  No LMP recorded. Patient has had a hysterectomy. 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  He is a little older than she and she is primary caregiver.  She has no birth children and one deceased step-child. Aniyha has no grandchildren. She attends a [employer].   ADVANCED DIRECTIVES: Her husband, Nadara Mustard.   HEALTH MAINTENANCE: Social History   Tobacco Use  . Smoking status: Former Smoker    Quit date: 06/06/1982    Years since quitting: 37.8  . Smokeless tobacco: Never Used  Vaping Use  . Vaping Use: Never used  Substance Use Topics  . Alcohol use: Yes    Alcohol/week: 0.0 standard drinks    Comment: wine- rare  . Drug use: No    Colonoscopy: yes, Nandigam  PAP:   Bone density: Oct 2021, T = -0.1   Allergies  Allergen Reactions  . Codeine     REACTION: Throat swelling  . Ivp Dye [Iodinated Diagnostic Agents] Swelling    Current Outpatient Medications  Medication Sig Dispense Refill  . anastrozole (ARIMIDEX) 1 MG  tablet Take 1 tablet (1 mg total) by mouth daily. 90 tablet 4  . Biotin 2500 MCG CAPS Take 1 capsule by mouth daily. 5,000 mcg    . Cholecalciferol (VITAMIN D3) 2000 units TABS Take 1 tablet by mouth daily.    Marland Kitchen levothyroxine (SYNTHROID) 150 MCG tablet Take 1 tablet (150 mcg total) by mouth daily. 90 tablet 3  . loratadine (CLARITIN) 10 MG tablet Take 10 mg by mouth daily as needed.      . metoprolol succinate (TOPROL-XL) 100 MG 24 hr tablet Take 1 by mouth daily with or after a meal 90 tablet 3  . Multiple Vitamin (MULTIVITAMIN) capsule Take 1 capsule by mouth daily.      . potassium chloride SA (KLOR-CON) 20 MEQ tablet Take 1 tablet by mouth every day 90 tablet 3  . triamterene-hydrochlorothiazide (MAXZIDE-25) 37.5-25 MG tablet Take 1 tablet by mouth daily. 90 tablet 3   No current facility-administered medications for this visit.    OBJECTIVE: White woman who appears stated age  20:   03/17/20 1056  BP: (!) 162/66  Pulse: 67    Resp: 18  Temp: (!) 97.4 F (36.3 C)  SpO2: 98%   Wt Readings from Last 3 Encounters:  03/17/20 215 lb 6.4 oz (97.7 kg)  08/29/19 210 lb 1.6 oz (95.3 kg)  08/05/19 212 lb 3.2 oz (96.3 kg)   Body mass index is 35.84 kg/m.    ECOG FS:1 - Symptomatic but completely ambulatory  Sclerae unicteric, EOMs intact Wearing a mask No cervical or supraclavicular adenopathy Lungs no rales or rhonchi Heart regular rate and rhythm Abd soft, nontender, positive bowel sounds MSK no focal spinal tenderness, no upper extremity lymphedema Neuro: nonfocal, well oriented, appropriate affect Breasts: Right breast is benign.  The left breast is status post lumpectomy and radiation.  There is no evidence of local recurrence.  Both axillae are benign.   LAB RESULTS:  CMP     Component Value Date/Time   NA 142 03/17/2020 1030   K 4.3 03/17/2020 1030   CL 105 03/17/2020 1030   CO2 33 (H) 03/17/2020 1030   GLUCOSE 99 03/17/2020 1030   BUN 6 (L) 03/17/2020 1030   CREATININE 0.66 03/17/2020 1030   CREATININE 0.73 07/04/2018 0823   CALCIUM 9.7 03/17/2020 1030   PROT 6.7 03/17/2020 1030   ALBUMIN 3.6 03/17/2020 1030   AST 54 (H) 03/17/2020 1030   AST 53 (H) 07/04/2018 0823   ALT 30 03/17/2020 1030   ALT 36 07/04/2018 0823   ALKPHOS 148 (H) 03/17/2020 1030   BILITOT 1.8 (H) 03/17/2020 1030   BILITOT 2.2 (H) 07/04/2018 0823   GFRNONAA >60 03/17/2020 1030   GFRNONAA >60 07/04/2018 0823   GFRAA >60 02/04/2019 1330   GFRAA >60 07/04/2018 0823    No results found for: TOTALPROTELP, ALBUMINELP, A1GS, A2GS, BETS, BETA2SER, GAMS, MSPIKE, SPEI  No results found for: KPAFRELGTCHN, LAMBDASER, KAPLAMBRATIO  Lab Results  Component Value Date   WBC 4.6 03/17/2020   NEUTROABS 2.8 03/17/2020   HGB 14.4 03/17/2020   HCT 41.3 03/17/2020   MCV 103.0 (H) 03/17/2020   PLT 65 (L) 03/17/2020   No results found for: LABCA2  No components found for: PPJKDT267  No results for input(s): INR in the last  168 hours.  No results found for: LABCA2  No results found for: TIW580  No results found for: DXI338  No results found for: SNK539  No results found for: CA2729  No components found  for: HGQUANT  No results found for: CEA1 / No results found for: CEA1   No results found for: AFPTUMOR  No results found for: CHROMOGRNA  No results found for: HGBA, HGBA2QUANT, HGBFQUANT, HGBSQUAN (Hemoglobinopathy evaluation)   No results found for: LDH  No results found for: IRON, TIBC, IRONPCTSAT (Iron and TIBC)  Lab Results  Component Value Date   FERRITIN 171.6 10/25/2012    Urinalysis    Component Value Date/Time   BILIRUBINUR neg. 05/21/2013 1623   PROTEINUR neg. 05/21/2013 1623   UROBILINOGEN 0.2 05/21/2013 1623   NITRITE neg. 05/21/2013 1623   LEUKOCYTESUR small (1+) 05/21/2013 1623    STUDIES:  No results found.    ELIGIBLE FOR AVAILABLE RESEARCH PROTOCOL: no   ASSESSMENT: 69 y.o. [place], [place] woman s/p Left breast upper inner quadrant biopsy 06/20/2018 for a clinical T1b No, stage IA invasive ducatal carcinoma, grade 1, estrogen and progesterone receptor positive, HER-2 not amplified, with an Mib-1 of 5%  (1) status post left lumpectomy and sentinel lymph node sampling 07/12/2018 for a pT1b pN0, stage IA invasive ductal carcinoma, grade 1, with close but negative margins.  (a) a total of 2 sentinel lymph nodes were removed  (2) adjuvant radiation03/02/2019 - 09/07/2018 Site/dose:   The patient initially received a dose of 42.56 Gy in 16 fractions to the left breast using whole-breast tangent fields. This was delivered using a 3-D conformal technique. The patient then received a boost to the seroma. This delivered an additional 8 Gy in 4 fractions using a 3 field photon technique due to the depth of the seroma. The total dose was 50.56 Gy.   (3) anastrozole started 10/19/2018  (a) bone density scan at [facility] 07/30/2015 shows a T score of -0.3  (b) bone density  03/11/2020 shows a T score of -0.1.   PLAN: Austin is now close to 2 years out from definitive surgery for her breast cancer with no evidence of disease recurrence.  This is very favorable.  She is tolerating anastrozole well and the plan is to continue that a minimum of 5 years.  She has an excellent bone density.  I commended her exercise program  She will see me again in a year.  She knows to call for any other issue that may develop before that.  Total encounter time 20 minutes.*  Bannon Giammarco, Virgie Dad, MD  03/17/20 11:12 AM Medical Oncology and Hematology [facility] [place], [place] [address] Tel. [phone]    Fax. [phone]    I, Wilburn Mylar, am acting as scribe for Dr. Virgie Dad. Teshaun Olarte.  I, Lurline Del MD, have reviewed the above documentation for accuracy and completeness, and I agree with the above.   *Total Encounter Time as defined by the Centers for Medicare and Medicaid Services includes, in addition to the face-to-face time of a patient visit (documented in the note above) non-face-to-face time: obtaining and reviewing outside history, ordering and reviewing medications, tests or procedures, care coordination (communications with other health care professionals or caregivers) and documentation in the medical record.

## 2020-03-18 ENCOUNTER — Telehealth: Payer: Self-pay | Admitting: Oncology

## 2020-03-18 NOTE — Telephone Encounter (Signed)
Scheduled per 10/12 los. Called pt and left a msg

## 2020-03-19 DIAGNOSIS — D1801 Hemangioma of skin and subcutaneous tissue: Secondary | ICD-10-CM | POA: Diagnosis not present

## 2020-03-19 DIAGNOSIS — L905 Scar conditions and fibrosis of skin: Secondary | ICD-10-CM | POA: Diagnosis not present

## 2020-03-19 DIAGNOSIS — L578 Other skin changes due to chronic exposure to nonionizing radiation: Secondary | ICD-10-CM | POA: Diagnosis not present

## 2020-03-19 DIAGNOSIS — L570 Actinic keratosis: Secondary | ICD-10-CM | POA: Diagnosis not present

## 2020-03-19 DIAGNOSIS — D225 Melanocytic nevi of trunk: Secondary | ICD-10-CM | POA: Diagnosis not present

## 2020-03-19 DIAGNOSIS — Z85828 Personal history of other malignant neoplasm of skin: Secondary | ICD-10-CM | POA: Diagnosis not present

## 2020-03-19 DIAGNOSIS — L814 Other melanin hyperpigmentation: Secondary | ICD-10-CM | POA: Diagnosis not present

## 2020-03-19 DIAGNOSIS — L728 Other follicular cysts of the skin and subcutaneous tissue: Secondary | ICD-10-CM | POA: Diagnosis not present

## 2020-03-24 DIAGNOSIS — Z23 Encounter for immunization: Secondary | ICD-10-CM | POA: Diagnosis not present

## 2020-04-02 ENCOUNTER — Encounter: Payer: Self-pay | Admitting: Family Medicine

## 2020-04-06 ENCOUNTER — Encounter: Payer: Self-pay | Admitting: Family Medicine

## 2020-06-19 DIAGNOSIS — L819 Disorder of pigmentation, unspecified: Secondary | ICD-10-CM | POA: Diagnosis not present

## 2020-06-19 DIAGNOSIS — L218 Other seborrheic dermatitis: Secondary | ICD-10-CM | POA: Diagnosis not present

## 2020-08-23 ENCOUNTER — Telehealth: Payer: Self-pay | Admitting: Family Medicine

## 2020-08-23 DIAGNOSIS — R739 Hyperglycemia, unspecified: Secondary | ICD-10-CM

## 2020-08-23 DIAGNOSIS — D696 Thrombocytopenia, unspecified: Secondary | ICD-10-CM

## 2020-08-23 DIAGNOSIS — E7800 Pure hypercholesterolemia, unspecified: Secondary | ICD-10-CM

## 2020-08-23 DIAGNOSIS — R7309 Other abnormal glucose: Secondary | ICD-10-CM

## 2020-08-23 DIAGNOSIS — E039 Hypothyroidism, unspecified: Secondary | ICD-10-CM

## 2020-08-23 DIAGNOSIS — I10 Essential (primary) hypertension: Secondary | ICD-10-CM

## 2020-08-23 NOTE — Telephone Encounter (Signed)
-----   Message from Cloyd Stagers, RT sent at 08/10/2020  1:57 PM EST ----- Regarding: Lab Orders for Monday 3.21.2022 Please place lab orders for Monday 3.21.2022, office visit for physical on Monday 3.28.2022 Thank you, Dyke Maes RT(R)

## 2020-08-24 ENCOUNTER — Other Ambulatory Visit: Payer: Self-pay

## 2020-08-24 ENCOUNTER — Other Ambulatory Visit (INDEPENDENT_AMBULATORY_CARE_PROVIDER_SITE_OTHER): Payer: Medicare Other

## 2020-08-24 ENCOUNTER — Telehealth: Payer: Self-pay | Admitting: *Deleted

## 2020-08-24 DIAGNOSIS — D696 Thrombocytopenia, unspecified: Secondary | ICD-10-CM | POA: Diagnosis not present

## 2020-08-24 DIAGNOSIS — R7309 Other abnormal glucose: Secondary | ICD-10-CM | POA: Diagnosis not present

## 2020-08-24 DIAGNOSIS — I10 Essential (primary) hypertension: Secondary | ICD-10-CM

## 2020-08-24 DIAGNOSIS — E7800 Pure hypercholesterolemia, unspecified: Secondary | ICD-10-CM

## 2020-08-24 DIAGNOSIS — E039 Hypothyroidism, unspecified: Secondary | ICD-10-CM | POA: Diagnosis not present

## 2020-08-24 LAB — CBC WITH DIFFERENTIAL/PLATELET
Basophils Absolute: 0 10*3/uL (ref 0.0–0.1)
Basophils Relative: 0.8 % (ref 0.0–3.0)
Eosinophils Absolute: 0.3 10*3/uL (ref 0.0–0.7)
Eosinophils Relative: 7.8 % — ABNORMAL HIGH (ref 0.0–5.0)
HCT: 42.1 % (ref 36.0–46.0)
Hemoglobin: 14.6 g/dL (ref 12.0–15.0)
Lymphocytes Relative: 19.7 % (ref 12.0–46.0)
Lymphs Abs: 0.9 10*3/uL (ref 0.7–4.0)
MCHC: 34.7 g/dL (ref 30.0–36.0)
MCV: 103.9 fl — ABNORMAL HIGH (ref 78.0–100.0)
Monocytes Absolute: 0.4 10*3/uL (ref 0.1–1.0)
Monocytes Relative: 9.7 % (ref 3.0–12.0)
Neutro Abs: 2.8 10*3/uL (ref 1.4–7.7)
Neutrophils Relative %: 62 % (ref 43.0–77.0)
Platelets: 88 10*3/uL — ABNORMAL LOW (ref 150.0–400.0)
RBC: 4.05 Mil/uL (ref 3.87–5.11)
RDW: 13 % (ref 11.5–15.5)
WBC: 4.5 10*3/uL (ref 4.0–10.5)

## 2020-08-24 LAB — COMPREHENSIVE METABOLIC PANEL
ALT: 26 U/L (ref 0–35)
AST: 51 U/L — ABNORMAL HIGH (ref 0–37)
Albumin: 4 g/dL (ref 3.5–5.2)
Alkaline Phosphatase: 103 U/L (ref 39–117)
BUN: 8 mg/dL (ref 6–23)
CO2: 29 mEq/L (ref 19–32)
Calcium: 9.3 mg/dL (ref 8.4–10.5)
Chloride: 100 mEq/L (ref 96–112)
Creatinine, Ser: 0.56 mg/dL (ref 0.40–1.20)
GFR: 91.52 mL/min (ref >60.00)
Glucose, Bld: 95 mg/dL (ref 70–99)
Potassium: 3.9 mEq/L (ref 3.5–5.1)
Sodium: 139 mEq/L (ref 135–145)
Total Bilirubin: 2 mg/dL — ABNORMAL HIGH (ref 0.2–1.2)
Total Protein: 6.3 g/dL (ref 6.0–8.3)

## 2020-08-24 LAB — LIPID PANEL
Cholesterol: 164 mg/dL (ref 0–200)
HDL: 41.1 mg/dL (ref >39.00)
LDL Cholesterol: 103 mg/dL — ABNORMAL HIGH (ref 0–99)
NonHDL: 122.9
Total CHOL/HDL Ratio: 4
Triglycerides: 100 mg/dL (ref 0.0–149.0)
VLDL: 20 mg/dL (ref 0.0–40.0)

## 2020-08-24 LAB — TSH: TSH: 13.64 u[IU]/mL — ABNORMAL HIGH (ref 0.35–4.50)

## 2020-08-24 LAB — HEMOGLOBIN A1C: Hgb A1c MFr Bld: 4.5 % — ABNORMAL LOW (ref 4.6–6.5)

## 2020-08-24 MED ORDER — LEVOTHYROXINE SODIUM 175 MCG PO TABS: 175.0000 ug | ORAL_TABLET | Freq: Every day | ORAL | 0 refills | Status: DC

## 2020-08-24 NOTE — Telephone Encounter (Signed)
Pt came in today for labs, CPE had to be r/s till April due to PCP out of the office, pt stated to me at lab appt that she needed a refill of her thyroid med. I advise pt I will wait until thyroid labs came back before filling med to make sure it was in normal range and pt should stay on current dose of med.   Labs are back and TSH is very elevated. I didn't want to send in med until PCP reviewed labs. Since pt is out of meds she wants Rx sent to local [facility]

## 2020-08-24 NOTE — Telephone Encounter (Signed)
Pt notified of lab results and Dr. Marliss Coots comments. Rx sent to pharmacy. FYI pt was also taking Biotin in the am close to her thyroid med. Pt will start taking med before bedtime

## 2020-08-24 NOTE — Telephone Encounter (Signed)
-----   Message from Abner Greenspan, MD sent at 08/24/2020  4:27 PM EDT ----- Pt is currently on levothyroxine 150 mcg and TSH is up  If she has not missed any doses please change to 175 mcg 1 po qd #90 no refills

## 2020-08-24 NOTE — Telephone Encounter (Signed)
Thanks - please see result note

## 2020-08-25 ENCOUNTER — Ambulatory Visit (INDEPENDENT_AMBULATORY_CARE_PROVIDER_SITE_OTHER): Payer: Medicare Other

## 2020-08-25 DIAGNOSIS — Z0000 Encounter for general adult medical examination without abnormal findings: Secondary | ICD-10-CM | POA: Diagnosis not present

## 2020-08-25 NOTE — Patient Instructions (Signed)
Selena Johnson , Thank you for taking time to come for your Medicare Wellness Visit. I appreciate your ongoing commitment to your health goals. Please review the following plan we discussed and let me know if I can assist you in the future.   Screening recommendations/referrals: Colonoscopy: Up to date, completed 04/15/2016, due 04/2021 Mammogram: Up to date, completed 03/11/2020, due 03/2021 Bone Density: Up to date, completed 03/11/2020, due 2-5 years  Recommended yearly ophthalmology/optometry visit for glaucoma screening and checkup Recommended yearly dental visit for hygiene and checkup  Vaccinations: Influenza vaccine: Up to date, completed 03/23/2020, due 01/2021 Pneumococcal vaccine: Completed series Tdap vaccine: Up to date, completed 07/26/2017, due 07/2027 Shingles vaccine: Completed series   Covid-19:Completed series  Advanced directives: Please bring a copy of your POA (Power of [place]) and/or Living Will to your next appointment.   Conditions/risks identified: hypertension, hyperlipidemia   Next appointment: Follow up in one year for your annual wellness visit    Preventive Care 72 Years and Older, Female Preventive care refers to lifestyle choices and visits with your health care provider that can promote health and wellness. What does preventive care include?  A yearly physical exam. This is also called an annual well check.  Dental exams once or twice a year.  Routine eye exams. Ask your health care provider how often you should have your eyes checked.  Personal lifestyle choices, including:  Daily care of your teeth and gums.  Regular physical activity.  Eating a healthy diet.  Avoiding tobacco and drug use.  Limiting alcohol use.  Practicing safe sex.  Taking low-dose aspirin every day.  Taking vitamin and mineral supplements as recommended by your health care provider. What happens during an annual well check? The services and screenings done by your  health care provider during your annual well check will depend on your age, overall health, lifestyle risk factors, and family history of disease. Counseling  Your health care provider may ask you questions about your:  Alcohol use.  Tobacco use.  Drug use.  Emotional well-being.  Home and relationship well-being.  Sexual activity.  Eating habits.  History of falls.  Memory and ability to understand (cognition).  Work and work Statistician.  Reproductive health. Screening  You may have the following tests or measurements:  Height, weight, and BMI.  Blood pressure.  Lipid and cholesterol levels. These may be checked every 5 years, or more frequently if you are over 26 years old.  Skin check.  Lung cancer screening. You may have this screening every year starting at age 72 if you have a 30-pack-year history of smoking and currently smoke or have quit within the past 15 years.  Fecal occult blood test (FOBT) of the stool. You may have this test every year starting at age 72  Flexible sigmoidoscopy or colonoscopy. You may have a sigmoidoscopy every 5 years or a colonoscopy every 10 years starting at age 47.  Hepatitis C blood test.  Hepatitis B blood test.  Sexually transmitted disease (STD) testing.  Diabetes screening. This is done by checking your blood sugar (glucose) after you have not eaten for a while (fasting). You may have this done every 1-3 years.  Bone density scan. This is done to screen for osteoporosis. You may have this done starting at age 72  Mammogram. This may be done every 1-2 years. Talk to your health care provider about how often you should have regular mammograms. Talk with your health care provider about your test results,  treatment options, and if necessary, the need for more tests. Vaccines  Your health care provider may recommend certain vaccines, such as:  Influenza vaccine. This is recommended every year.  Tetanus, diphtheria, and  acellular pertussis (Tdap, Td) vaccine. You may need a Td booster every 10 years.  Zoster vaccine. You may need this after age 72  Pneumococcal 13-valent conjugate (PCV13) vaccine. One dose is recommended after age 72  Pneumococcal polysaccharide (PPSV23) vaccine. One dose is recommended after age 72 Talk to your health care provider about which screenings and vaccines you need and how often you need them. This information is not intended to replace advice given to you by your health care provider. Make sure you discuss any questions you have with your health care provider. Document Released: 06/19/2015 Document Revised: 02/10/2016 Document Reviewed: 03/24/2015 Elsevier Interactive Patient Education  2017 [place] Prevention in the Home Falls can cause injuries. They can happen to people of all ages. There are many things you can do to make your home safe and to help prevent falls. What can I do on the outside of my home?  Regularly fix the edges of walkways and driveways and fix any cracks.  Remove anything that might make you trip as you walk through a door, such as a raised step or threshold.  Trim any bushes or trees on the path to your home.  Use bright outdoor lighting.  Clear any walking paths of anything that might make someone trip, such as rocks or tools.  Regularly check to see if handrails are loose or broken. Make sure that both sides of any steps have handrails.  Any raised decks and porches should have guardrails on the edges.  Have any leaves, snow, or ice cleared regularly.  Use sand or salt on walking paths during winter.  Clean up any spills in your garage right away. This includes oil or grease spills. What can I do in the bathroom?  Use night lights.  Install grab bars by the toilet and in the tub and shower. Do not use towel bars as grab bars.  Use non-skid mats or decals in the tub or shower.  If you need to sit down in the shower, use  a plastic, non-slip stool.  Keep the floor dry. Clean up any water that spills on the floor as soon as it happens.  Remove soap buildup in the tub or shower regularly.  Attach bath mats securely with double-sided non-slip rug tape.  Do not have throw rugs and other things on the floor that can make you trip. What can I do in the bedroom?  Use night lights.  Make sure that you have a light by your bed that is easy to reach.  Do not use any sheets or blankets that are too big for your bed. They should not hang down onto the floor.  Have a firm chair that has side arms. You can use this for support while you get dressed.  Do not have throw rugs and other things on the floor that can make you trip. What can I do in the kitchen?  Clean up any spills right away.  Avoid walking on wet floors.  Keep items that you use a lot in easy-to-reach places.  If you need to reach something above you, use a strong step stool that has a grab bar.  Keep electrical cords out of the way.  Do not use floor polish or wax that makes floors  slippery. If you must use wax, use non-skid floor wax.  Do not have throw rugs and other things on the floor that can make you trip. What can I do with my stairs?  Do not leave any items on the stairs.  Make sure that there are handrails on both sides of the stairs and use them. Fix handrails that are broken or loose. Make sure that handrails are as long as the stairways.  Check any carpeting to make sure that it is firmly attached to the stairs. Fix any carpet that is loose or worn.  Avoid having throw rugs at the top or bottom of the stairs. If you do have throw rugs, attach them to the floor with carpet tape.  Make sure that you have a light switch at the top of the stairs and the bottom of the stairs. If you do not have them, ask someone to add them for you. What else can I do to help prevent falls?  Wear shoes that:  Do not have high heels.  Have  rubber bottoms.  Are comfortable and fit you well.  Are closed at the toe. Do not wear sandals.  If you use a stepladder:  Make sure that it is fully opened. Do not climb a closed stepladder.  Make sure that both sides of the stepladder are locked into place.  Ask someone to hold it for you, if possible.  Clearly mark and make sure that you can see:  Any grab bars or handrails.  First and last steps.  Where the edge of each step is.  Use tools that help you move around (mobility aids) if they are needed. These include:  Canes.  Walkers.  Scooters.  Crutches.  Turn on the lights when you go into a dark area. Replace any light bulbs as soon as they burn out.  Set up your furniture so you have a clear path. Avoid moving your furniture around.  If any of your floors are uneven, fix them.  If there are any pets around you, be aware of where they are.  Review your medicines with your doctor. Some medicines can make you feel dizzy. This can increase your chance of falling. Ask your doctor what other things that you can do to help prevent falls. This information is not intended to replace advice given to you by your health care provider. Make sure you discuss any questions you have with your health care provider. Document Released: 03/19/2009 Document Revised: 10/29/2015 Document Reviewed: 06/27/2014 Elsevier Interactive Patient Education  2017 [employer].

## 2020-08-25 NOTE — Progress Notes (Signed)
PCP notes:  Health Maintenance: No gaps noted   Abnormal Screenings: none   Patient concerns: none   Nurse concerns: none   Next PCP appt.: 09/07/2020 @ 10:30 am

## 2020-08-25 NOTE — Progress Notes (Signed)
Subjective:   Selena Johnson is a 70 y.o. female who presents for Medicare Annual (Subsequent) preventive examination.  Review of Systems: N/A      I connected with the patient today by telephone and verified that I am speaking with the correct person using two identifiers. Location patient: home Location nurse: work Persons participating in the telephone visit: patient, nurse.   I discussed the limitations, risks, security and privacy concerns of performing an evaluation and management service by telephone and the availability of in person appointments. I also discussed with the patient that there may be a patient responsible charge related to this service. The patient expressed understanding and verbally consented to this telephonic visit.        Cardiac Risk Factors include: advanced age (>1mn, >>46women);hypertension;Other (see comment), Risk factor comments: hyperlipidemia     Objective:    Today's Vitals   There is no height or weight on file to calculate BMI.  Advanced Directives 08/25/2020 08/08/2019 07/09/2018 07/04/2018 07/24/2017 07/21/2016 04/15/2016  Does Patient Have a Medical Advance Directive? Yes Yes No No Yes Yes Yes  Type of [place] will [place] - - [place] will -  Copy of H[place]abelin Chart? No - copy requested No - copy requested - - No - copy requested Yes No - copy requested  Would patient like information on creating a medical advance directive? - - No - Patient declined No - Patient declined - - -    Current Medications (verified) Outpatient Encounter Medications as of 08/25/2020  Medication Sig  . anastrozole (ARIMIDEX) 1 MG tablet Take 1 tablet (1 mg total) by mouth daily.  . Biotin 2500 MCG CAPS Take 1 capsule by mouth daily. 5,000 mcg  . Cholecalciferol (VITAMIN D3) 2000 units TABS Take 1  tablet by mouth daily.  .Marland Kitchenlevothyroxine (SYNTHROID) 175 MCG tablet Take 1 tablet (175 mcg total) by mouth daily before breakfast.  . loratadine (CLARITIN) 10 MG tablet Take 10 mg by mouth daily as needed.  . metoprolol succinate (TOPROL-XL) 100 MG 24 hr tablet Take 1 by mouth daily with or after a meal  . Multiple Vitamin (MULTIVITAMIN) capsule Take 1 capsule by mouth daily.  . potassium chloride SA (KLOR-CON) 20 MEQ tablet Take 1 tablet by mouth every day  . triamterene-hydrochlorothiazide (MAXZIDE-25) 37.5-25 MG tablet Take 1 tablet by mouth daily.   No facility-administered encounter medications on file as of 08/25/2020.    Allergies (verified) Codeine and Ivp dye [iodinated diagnostic agents]   History: Past Medical History:  Diagnosis Date  . Allergy   . Cancer ([place]    basal cell skin CA  . Chronic kidney disease    hx of kidney stones  . Conjunctivitis    chronic  . Fatty liver 2015  . GERD (gastroesophageal reflux disease)   . Glaucoma   . Hearing loss in left ear   . History of kidney stones   . Hyperlipidemia    no per pt  . Hypertension   . Menopausal syndrome   . NASH (nonalcoholic steatohepatitis)    with cirrhosis and mild esoph varicies  . Primary hypothyroidism    Past Surgical History:  Procedure Laterality Date  . ABDOMINAL HYSTERECTOMY     partial ? prolapse  . BREAST LUMPECTOMY WITH RADIOACTIVE SEED AND SENTINEL LYMPH NODE BIOPSY Left 07/12/2018   Procedure: LEFT BREAST LUMPECTOMY WITH  RADIOACTIVE SEED AND LEFT AXILLARY SENTINEL LYMPH NODE BIOPSY;  Surgeon: Rolm Bookbinder, MD;  Location: [facility];  Service: General;  Laterality: Left;  . CHOLECYSTECTOMY    . ESOPHAGOGASTRODUODENOSCOPY    . TONSILLECTOMY     Family History  Problem Relation Age of Onset  . Heart disease Mother        CAD  . Hypertension Mother   . Glaucoma Mother   . Heart disease Father        CAD  . Diabetes Father   . Hypertension Father   . Cancer Father        ? CA -  squamous cell carcinoma  . Colon cancer Neg Hx   . Esophageal cancer Neg Hx   . Rectal cancer Neg Hx   . Stomach cancer Neg Hx    Social History   Socioeconomic History  . Marital status: Married    Spouse name: Dwaine Deter. Birr  . Number of children: Not on file  . Years of education: Not on file  . Highest education level: Not on file  Occupational History  . Not on file  Tobacco Use  . Smoking status: Former Smoker    Quit date: 06/06/1982    Years since quitting: 38.2  . Smokeless tobacco: Never Used  Vaping Use  . Vaping Use: Never used  Substance and Sexual Activity  . Alcohol use: Yes    Alcohol/week: 0.0 standard drinks    Comment: wine- rare  . Drug use: No  . Sexual activity: Not on file  Other Topics Concern  . Not on file  Social History Narrative  . Not on file   Social Determinants of Health   Financial Resource Strain: Low Risk   . Difficulty of Paying Living Expenses: Not hard at all  Food Insecurity: No Food Insecurity  . Worried About Charity fundraiser in the Last Year: Never true  . Ran Out of Food in the Last Year: Never true  Transportation Needs: No Transportation Needs  . Lack of Transportation (Medical): No  . Lack of Transportation (Non-Medical): No  Physical Activity: Inactive  . Days of Exercise per Week: 0 days  . Minutes of Exercise per Session: 0 min  Stress: Stress Concern Present  . Feeling of Stress : Rather much  Social Connections: Not on file    Tobacco Counseling Counseling given: Not Answered   Clinical Intake:  Pre-visit preparation completed: Yes  Pain : No/denies pain     Nutritional Risks: None Diabetes: No  How often do you need to have someone help you when you read instructions, pamphlets, or other written materials from your doctor or pharmacy?: 1 - Never What is the last grade level you completed in school?: Bachelors  Diabetic: No Nutrition Risk Assessment:  Has the patient had any N/V/D within the  last 2 months?  No  Does the patient have any non-healing wounds?  No  Has the patient had any unintentional weight loss or weight gain?  No   Diabetes:  Is the patient diabetic?  No  If diabetic, was a CBG obtained today?  N/A Did the patient bring in their glucometer from home?  N/A How often do you monitor your CBG's? N/A.   Financial Strains and Diabetes Management:  Are you having any financial strains with the device, your supplies or your medication? N/A.  Does the patient want to be seen by Chronic Care Management for management of their diabetes?  N/A Would  the patient like to be referred to a Nutritionist or for Diabetic Management?  N/A  Interpreter Needed?: No  Information entered by :: CJohnson, LPN   Activities of Daily Living In your present state of health, do you have any difficulty performing the following activities: 08/25/2020  Hearing? N  Vision? N  Difficulty concentrating or making decisions? N  Walking or climbing stairs? N  Dressing or bathing? N  Doing errands, shopping? N  Preparing Food and eating ? N  Using the Toilet? N  In the past six months, have you accidently leaked urine? N  Do you have problems with loss of bowel control? N  Managing your Medications? N  Managing your Finances? N  Housekeeping or managing your Housekeeping? N  Some recent data might be hidden    Patient Care Team: Tower, [name], MD as PCP - General Vin-Parikh, Deirdre Peer, MD as Referring Physician (Ophthalmology) Glennie Isle, PA-C as Physician Assistant (Physician Assistant) Magrinat, Virgie Dad, MD as Consulting Physician (Oncology) Rolm Bookbinder, MD as Consulting Physician (General Surgery) Kyung Rudd, MD as Consulting Physician (Radiation Oncology) Druscilla Brownie, MD as Referring Physician (Dermatology) Mauro Kaufmann, RN as Oncology Nurse Navigator Rockwell Germany, RN as Oncology Nurse Navigator  Indicate any recent Medical Services you may  have received from other than [facility] providers in the past year (date may be approximate).     Assessment:   This is a routine wellness examination for [place].  Hearing/Vision screen  Hearing Screening   125Hz  250Hz  500Hz  1000Hz  2000Hz  3000Hz  4000Hz  6000Hz  8000Hz   Right ear:           Left ear:           Vision Screening Comments: Patient gets annual eye exams   Dietary issues and exercise activities discussed: Current Exercise Habits: The patient does not participate in regular exercise at present, Exercise limited by: None identified  Goals    . Increase physical activity     Starting 07/21/16, I will continue to walk at least 30 min daily as weather permits.     . Increase physical activity     Starting 07/24/2017, I will continue to walk 20-30 minutes daily.     . Patient Stated     08/08/2019, I will continue to walk 4 days a week for 30 minutes.     . Patient Stated     08/25/2020, I will maintain and continue medications as prescribed.       Depression Screen PHQ 2/9 Scores 08/25/2020 08/08/2019 08/03/2018 07/24/2017 07/21/2016 07/21/2015 07/18/2014  PHQ - 2 Score 6 0 0 0 0 0 0  PHQ- 9 Score 7 0 - 0 - - -    Fall Risk Fall Risk  08/25/2020 08/08/2019 08/03/2018 07/24/2017 07/21/2016  Falls in the past year? 0 1 0 No No  Comment - tripped in the yard - - -  Number falls in past yr: 0 0 0 - -  Injury with Fall? 0 0 - - -  Risk for fall due to : No Fall Risks Medication side effect - - -  Follow up Falls evaluation completed;Falls prevention discussed Falls evaluation completed;Falls prevention discussed - - -    FALL RISK PREVENTION PERTAINING TO THE HOME:  Any stairs in or around the home? Yes  If so, are there any without handrails? No  Home free of loose throw rugs in walkways, pet beds, electrical cords, etc? Yes  Adequate lighting in your home to  reduce risk of falls? Yes   ASSISTIVE DEVICES UTILIZED TO PREVENT FALLS:  Life alert? No  Use of a cane, walker or w/c? No   Grab bars in the bathroom? No  Shower chair or bench in shower? No  Elevated toilet seat or a handicapped toilet? No   TIMED UP AND GO:  Was the test performed? N/A telephone visit .   Cognitive Function: MMSE - Mini Mental State Exam 08/25/2020 08/08/2019 07/24/2017 07/21/2016  Orientation to time 5 5 5 5   Orientation to Place 5 5 5 5   Registration 3 3 3 3   Attention/ Calculation 5 5 0 0  Recall 3 3 3 3   Language- name 2 objects - - 0 0  Language- repeat 1 1 1 1   Language- follow 3 step command - - 3 3  Language- read & follow direction - - 0 0  Write a sentence - - 0 0  Copy design - - 0 0  Total score - - 20 20  Mini Cog  Mini-Cog screen was completed. Maximum score is 22. A value of 0 denotes this part of the MMSE was not completed or the patient failed this part of the Mini-Cog screening.       Immunizations Immunization History  Administered Date(s) Administered  . Hep A / Hep B 10/25/2012, 11/01/2012, 11/26/2012, 10/29/2013  . Influenza Split 03/03/2011  . Influenza Whole 03/16/2007, 03/10/2008, 03/18/2009, 03/31/2010  . Influenza, High Dose Seasonal PF 04/05/2018, 03/20/2019  . Influenza-Unspecified 03/07/2014, 04/03/2015, 03/22/2017, 03/23/2020  . PFIZER(Purple Top)SARS-COV-2 Vaccination 07/13/2019, 08/07/2019, 03/04/2020  . Pneumococcal Conjugate-13 07/18/2014  . Pneumococcal Polysaccharide-23 10/25/2012, 07/26/2016  . Td 06/06/1996, 10/23/2006, 07/26/2017  . Zoster 11/03/2009  . Zoster Recombinat (Shingrix) 08/23/2017, 10/26/2017    TDAP status: Up to date  Flu Vaccine status: Up to date  Pneumococcal vaccine status: Up to date  Covid-19 vaccine status: Completed vaccines  Qualifies for Shingles Vaccine? Yes   Zostavax completed Yes   Shingrix Completed?: Yes  Screening Tests Health Maintenance  Topic Date Due  . COVID-19 Vaccine (4 - Booster for Pfizer series) 09/01/2020  . MAMMOGRAM  03/11/2021  . COLONOSCOPY (Pts 45-87yr Insurance coverage  will need to be confirmed)  04/15/2021  . TETANUS/TDAP  07/27/2027  . INFLUENZA VACCINE  Completed  . DEXA SCAN  Completed  . Hepatitis C Screening  Completed  . PNA vac Low Risk Adult  Completed  . HPV VACCINES  Aged Out    Health Maintenance  There are no preventive care reminders to display for this patient.  Colorectal cancer screening: Type of screening: Colonoscopy. Completed 04/15/2016. Repeat every 5 years  Mammogram status: Completed 03/11/2020. Repeat every year  Bone Density status: Completed 03/11/2020. Results reflect: Bone density results: NORMAL. Repeat every 2-5 years.  Lung Cancer Screening: (Low Dose CT Chest recommended if Age 72-80years, 30 pack-year currently smoking OR have quit w/in 15 years.) does not qualify.   Additional Screening:  Hepatitis C Screening: does qualify; Completed 10/23/2012  Vision Screening: Recommended annual ophthalmology exams for early detection of glaucoma and other disorders of the eye. Is the patient up to date with their annual eye exam?  Yes  Who is the provider or what is the name of the office in which the patient attends annual eye exams? [facility] pt is not established with a provider, would they like to be referred to a provider to establish care? No .   Dental Screening: Recommended annual dental  exams for proper oral hygiene  Community Resource Referral / Chronic Care Management: CRR required this visit?  No   CCM required this visit?  No      Plan:     I have personally reviewed and noted the following in the patient's chart:   . Medical and social history . Use of alcohol, tobacco or illicit drugs  . Current medications and supplements . Functional ability and status . Nutritional status . Physical activity . Advanced directives . List of other physicians . Hospitalizations, surgeries, and ER visits in previous 12 months . Vitals . Screenings to include cognitive, depression, and  falls . Referrals and appointments  In addition, I have reviewed and discussed with patient certain preventive protocols, quality metrics, and best practice recommendations. A written personalized care plan for preventive services as well as general preventive health recommendations were provided to patient.   Due to this being a telephonic visit, the after visit summary with patients personalized plan was offered to patient via office or my-chart. Patient preferred to pick up at office at next visit or via mychart.   Andrez Grime, LPN   [id number]

## 2020-08-31 ENCOUNTER — Ambulatory Visit: Payer: Medicare Other | Admitting: Family Medicine

## 2020-09-07 ENCOUNTER — Ambulatory Visit (INDEPENDENT_AMBULATORY_CARE_PROVIDER_SITE_OTHER): Payer: Medicare Other | Admitting: Family Medicine

## 2020-09-07 ENCOUNTER — Encounter: Payer: Self-pay | Admitting: Family Medicine

## 2020-09-07 ENCOUNTER — Other Ambulatory Visit: Payer: Self-pay

## 2020-09-07 VITALS — BP 122/68 | HR 76 | Temp 96.9°F | Ht 65.0 in | Wt 215.6 lb

## 2020-09-07 DIAGNOSIS — D696 Thrombocytopenia, unspecified: Secondary | ICD-10-CM | POA: Diagnosis not present

## 2020-09-07 DIAGNOSIS — Z6835 Body mass index (BMI) 35.0-35.9, adult: Secondary | ICD-10-CM

## 2020-09-07 DIAGNOSIS — R7309 Other abnormal glucose: Secondary | ICD-10-CM

## 2020-09-07 DIAGNOSIS — E7800 Pure hypercholesterolemia, unspecified: Secondary | ICD-10-CM

## 2020-09-07 DIAGNOSIS — E039 Hypothyroidism, unspecified: Secondary | ICD-10-CM

## 2020-09-07 DIAGNOSIS — I10 Essential (primary) hypertension: Secondary | ICD-10-CM | POA: Diagnosis not present

## 2020-09-07 DIAGNOSIS — Z636 Dependent relative needing care at home: Secondary | ICD-10-CM | POA: Diagnosis not present

## 2020-09-07 DIAGNOSIS — Z1211 Encounter for screening for malignant neoplasm of colon: Secondary | ICD-10-CM

## 2020-09-07 DIAGNOSIS — K7460 Unspecified cirrhosis of liver: Secondary | ICD-10-CM | POA: Diagnosis not present

## 2020-09-07 DIAGNOSIS — I8510 Secondary esophageal varices without bleeding: Secondary | ICD-10-CM | POA: Diagnosis not present

## 2020-09-07 DIAGNOSIS — R739 Hyperglycemia, unspecified: Secondary | ICD-10-CM

## 2020-09-07 MED ORDER — LEVOTHYROXINE SODIUM 200 MCG PO TABS: 200.0000 ug | ORAL_TABLET | Freq: Every day | ORAL | 3 refills | Status: DC

## 2020-09-07 MED ORDER — TRIAMTERENE-HCTZ 37.5-25 MG PO TABS: 1.0000 | ORAL_TABLET | Freq: Every day | ORAL | 3 refills | Status: DC

## 2020-09-07 MED ORDER — POTASSIUM CHLORIDE CRYS ER 20 MEQ PO TBCR: EXTENDED_RELEASE_TABLET | ORAL | 3 refills | Status: AC

## 2020-09-07 MED ORDER — METOPROLOL SUCCINATE ER 100 MG PO TB24: ORAL_TABLET | ORAL | 3 refills | Status: AC

## 2020-09-07 NOTE — Assessment & Plan Note (Signed)
Caring for her husband with cancer Struggling  No time for self care  Makes her eat poorly  Ref made for counseling  Reviewed stressors/ coping techniques/symptoms/ support sources/ tx options and side effects in detail today

## 2020-09-07 NOTE — Assessment & Plan Note (Signed)
LDL is down this draw to 103 Disc goals for lipids and reasons to control them Rev last labs with pt Rev low sat fat diet in detail Commended

## 2020-09-07 NOTE — Assessment & Plan Note (Signed)
Colonoscopy 11/17 with 5 y recall (will be this coming November) Pt aware and will call if referral is needed

## 2020-09-07 NOTE — Assessment & Plan Note (Signed)
Lab Results  Component Value Date   HGBA1C 4.5 (L) 08/24/2020   Good  disc imp of low glycemic diet and wt loss to prevent DM2

## 2020-09-07 NOTE — Assessment & Plan Note (Signed)
Mild in 2014 -EGD with GI Due to cirrhosis /fatty liver  No symptoms

## 2020-09-07 NOTE — Assessment & Plan Note (Addendum)
No new bleeding or bruising  In the setting of liver cirrhosis  Followed by heme/onc plt ct of 88

## 2020-09-07 NOTE — Assessment & Plan Note (Signed)
Discussed how this problem influences overall health and the risks it imposes  Reviewed plan for weight loss with lower calorie diet (via better food choices and also portion control or program like weight watchers) and exercise building up to or more than 30 minutes 5 days per week including some aerobic activity   Not much time for self care lately

## 2020-09-07 NOTE — Assessment & Plan Note (Signed)
Hypothyroidism  Pt has some increased sluggishness Taking 175 mcg of levothyroxine daily Lab Results  Component Value Date   TSH 13.64 (H) 08/24/2020   plan to increase to 200 mcg  Re check lab in about a month

## 2020-09-07 NOTE — Assessment & Plan Note (Signed)
Followed by GI Liver labs are stable to slt improved

## 2020-09-07 NOTE — Assessment & Plan Note (Signed)
bp in fair control at this time  BP Readings from Last 1 Encounters:  09/07/20 122/68   No changes needed Most recent labs reviewed  Disc lifstyle change with low sodium diet and exercise  Plan to continue metoprolol xl 25 mg daily and maxzide 37.5-25 mg daily

## 2020-09-07 NOTE — Patient Instructions (Addendum)
I placed a counseling referral for stress reaction /you will get a call   I think you are due for a colonoscopy in November   I think it is a good idea to get a 2nd covid booster   For constipation - eat fiber Drink fluids miralax over the counter is very safe   We need to increase your thyroid supplement to 200 mcg daily  Re check in a month  This may help energy level

## 2020-09-07 NOTE — Progress Notes (Signed)
Subjective:    Patient ID: Selena Johnson, female    DOB: May 19, 1951, 70 y.o.   MRN: [id number]  This visit occurred during the SARS-CoV-2 public health emergency.  Safety protocols were in place, including screening questions prior to the visit, additional usage of staff PPE, and extensive cleaning of exam room while observing appropriate contact time as indicated for disinfecting solutions.    HPI Pt presents for annual f/u of chronic health problems Wt Readings from Last 3 Encounters:  09/07/20 215 lb 9 oz (97.8 kg)  03/17/20 215 lb 6.4 oz (97.7 kg)  08/29/19 210 lb 1.6 oz (95.3 kg)   35.87 kg/m  Husband's cancer came back  Struggling with that  Trying to care for him and he needs to eat differently than she   She is trying to eat better  No energy  Stress makes this worse  No family left to help    amw was done on 3/22 No gaps noted   Mammogram 10/21 Personal h/o breast cancer taking arimidex   Self breast exam - no changes   Interested in covid vaccine 2nd booster   Colonoscopy 11/17 with 5 y recall  dexa 10/21-normal [place]-no Fractures-no Supplements -vit D Exercise -caring for her husband    HTN bp is stable today  No cp or palpitations or headaches or edema  No side effects to medicines  BP Readings from Last 3 Encounters:  09/07/20 122/68  03/17/20 (!) 162/66  08/29/19 126/74   takes metoprolol xl 100 mg daily  maxzide 37.5-25 mg daily   Pulse Readings from Last 3 Encounters:  09/07/20 76  03/17/20 67  08/29/19 74   Less exercise tolerance   More gas lately  Avoids carbonation  Constipation more  Eating fruit  Cannot have daily at all     Lab Results  Component Value Date   CREATININE 0.56 08/24/2020   BUN 8 08/24/2020   NA 139 08/24/2020   K 3.9 08/24/2020   CL 100 08/24/2020   CO2 29 08/24/2020    H/o hepatic cirrhosis  Improved  Lab Results  Component Value Date   ALT 26 08/24/2020   AST 51 (H) 08/24/2020    ALKPHOS 103 08/24/2020   BILITOT 2.0 (H) 08/24/2020    Hypothyroidism  Pt has no clinical changes No change in energy level/ hair or skin/ edema and no tremor Lab Results  Component Value Date   TSH 13.64 (H) 08/24/2020    Takes 175 mcg levothyroxine daily Last time tsh was low and dose was decreased and then had to inc it again  Thought change from arimidex tx No missed doses , and takes it correctly    Past elevated glucose Lab Results  Component Value Date   HGBA1C 4.5 (L) 08/24/2020   This is very good   Past thrombocytopenia Lab Results  Component Value Date   WBC 4.5 08/24/2020   HGB 14.6 08/24/2020   HCT 42.1 08/24/2020   MCV 103.9 (H) 08/24/2020   PLT 88.0 (L) 08/24/2020   Followed by heme/onc   Hyperlipidemia Lab Results  Component Value Date   CHOL 164 08/24/2020   CHOL 202 (H) 08/08/2019   CHOL 214 (H) 07/27/2018   Lab Results  Component Value Date   HDL 41.10 08/24/2020   HDL 64.80 08/08/2019   HDL 67.10 07/27/2018   Lab Results  Component Value Date   LDLCALC 103 (H) 08/24/2020   LDLCALC 124 (H) 08/08/2019   [place]  133 (H) 07/27/2018   Lab Results  Component Value Date   TRIG 100.0 08/24/2020   TRIG 66.0 08/08/2019   TRIG 69.0 07/27/2018   Lab Results  Component Value Date   CHOLHDL 4 08/24/2020   CHOLHDL 3 08/08/2019   CHOLHDL 3 07/27/2018   Lab Results  Component Value Date   LDLDIRECT 150.1 07/08/2013   LDLDIRECT 144.9 01/02/2013   LDLDIRECT 139.8 07/06/2012  LDL is down this draw to 103  Past thrombocytopenia Lab Results  Component Value Date   WBC 4.5 08/24/2020   HGB 14.6 08/24/2020   HCT 42.1 08/24/2020   MCV 103.9 (H) 08/24/2020   PLT 88.0 (L) 08/24/2020   Followed by hematology  Patient Active Problem List   Diagnosis Date Noted  . Caregiver stress 09/07/2020  . Malignant neoplasm of upper-inner quadrant of left breast in female, estrogen receptor positive ([place]) 06/27/2018  . Left breast mass 06/17/2018  .  Elevated random blood glucose level 07/28/2017  . Skin cancer screening 05/05/2017  . Colon cancer screening 07/21/2015  . Estrogen deficiency 07/21/2015  . Calcification of right breast 05/06/2015  . Encounter for Medicare annual wellness exam 07/20/2014  . Unspecified constipation 10/25/2012  . Hepatic cirrhosis ([place]) 10/23/2012  . Hypokalemia 07/13/2012  . Thrombocytopenia ([place]) 11/10/2010  . Routine general medical examination at a health care facility 11/04/2010  . Hyperlipidemia 03/18/2008  . Obesity 03/18/2008  . TRANSAMINASES, SERUM, ELEVATED 03/18/2008  . MENOPAUSE-RELATED VASOMOTOR SYMPTOMS, HOT FLASHES 10/23/2006  . Hypothyroidism 10/11/2006  . GLAUCOMA 10/11/2006  . Essential hypertension 10/11/2006  . GERD 10/11/2006  . History of kidney stones 10/11/2006   Past Medical History:  Diagnosis Date  . Allergy   . Cancer ([place])    basal cell skin CA  . Chronic kidney disease    hx of kidney stones  . Conjunctivitis    chronic  . Fatty liver 2015  . GERD (gastroesophageal reflux disease)   . Glaucoma   . Hearing loss in left ear   . History of kidney stones   . Hyperlipidemia    no per pt  . Hypertension   . Menopausal syndrome   . NASH (nonalcoholic steatohepatitis)    with cirrhosis and mild esoph varicies  . Primary hypothyroidism    Past Surgical History:  Procedure Laterality Date  . ABDOMINAL HYSTERECTOMY     partial ? prolapse  . BREAST LUMPECTOMY WITH RADIOACTIVE SEED AND SENTINEL LYMPH NODE BIOPSY Left 07/12/2018   Procedure: LEFT BREAST LUMPECTOMY WITH RADIOACTIVE SEED AND LEFT AXILLARY SENTINEL LYMPH NODE BIOPSY;  Surgeon: Rolm Bookbinder, MD;  Location: [facility];  Service: General;  Laterality: Left;  . CHOLECYSTECTOMY    . ESOPHAGOGASTRODUODENOSCOPY    . TONSILLECTOMY     Social History   Tobacco Use  . Smoking status: Former Smoker    Quit date: 06/06/1982    Years since quitting: 38.2  . Smokeless tobacco: Never Used  Vaping Use  . Vaping  Use: Never used  Substance Use Topics  . Alcohol use: Yes    Alcohol/week: 0.0 standard drinks    Comment: wine- rare  . Drug use: No   Family History  Problem Relation Age of Onset  . Heart disease Mother        CAD  . Hypertension Mother   . Glaucoma Mother   . Heart disease Father        CAD  . Diabetes Father   . Hypertension Father   . Cancer Father        ?  CA - squamous cell carcinoma  . Colon cancer Neg Hx   . Esophageal cancer Neg Hx   . Rectal cancer Neg Hx   . Stomach cancer Neg Hx    Allergies  Allergen Reactions  . Codeine     REACTION: Throat swelling  . Ivp Dye [Iodinated Diagnostic Agents] Swelling   Current Outpatient Medications on File Prior to Visit  Medication Sig Dispense Refill  . anastrozole (ARIMIDEX) 1 MG tablet Take 1 tablet (1 mg total) by mouth daily. 90 tablet 4  . Biotin 2500 MCG CAPS Take 1 capsule by mouth daily. 5,000 mcg    . Cholecalciferol (VITAMIN D3) 2000 units TABS Take 1 tablet by mouth daily.    Marland Kitchen loratadine (CLARITIN) 10 MG tablet Take 10 mg by mouth daily as needed.    . Multiple Vitamin (MULTIVITAMIN) capsule Take 1 capsule by mouth daily.     No current facility-administered medications on file prior to visit.    Review of Systems  Constitutional: Positive for fatigue. Negative for activity change, appetite change, fever and unexpected weight change.  HENT: Negative for congestion, ear pain, rhinorrhea, sinus pressure and sore throat.   Eyes: Negative for pain, redness and visual disturbance.  Respiratory: Negative for cough, shortness of breath and wheezing.   Cardiovascular: Negative for chest pain and palpitations.  Gastrointestinal: Negative for abdominal distention, abdominal pain, blood in stool, constipation, diarrhea and nausea.  Endocrine: Negative for polydipsia and polyuria.  Genitourinary: Negative for dysuria, frequency and urgency.  Musculoskeletal: Negative for arthralgias, back pain and myalgias.  Skin:  Negative for pallor and rash.  Allergic/Immunologic: Negative for environmental allergies.  Neurological: Negative for dizziness, syncope and headaches.  Hematological: Negative for adenopathy. Does not bruise/bleed easily.  Psychiatric/Behavioral: Negative for decreased concentration and dysphoric mood. The patient is not nervous/anxious.        Caregiver stress       Objective:   Physical Exam Constitutional:      General: She is not in acute distress.    Appearance: Normal appearance. She is well-developed. She is obese. She is not ill-appearing or diaphoretic.  HENT:     Head: Normocephalic and atraumatic.     Right Ear: Tympanic membrane, ear canal and external ear normal.     Left Ear: Tympanic membrane, ear canal and external ear normal.     Nose: Nose normal. No congestion.     Mouth/Throat:     Mouth: Mucous membranes are moist.     Pharynx: Oropharynx is clear. No posterior oropharyngeal erythema.  Eyes:     General: No scleral icterus.    Extraocular Movements: Extraocular movements intact.     Conjunctiva/sclera: Conjunctivae normal.     Pupils: Pupils are equal, round, and reactive to light.  Neck:     Thyroid: No thyromegaly.     Vascular: No carotid bruit or JVD.  Cardiovascular:     Rate and Rhythm: Normal rate and regular rhythm.     Pulses: Normal pulses.     Heart sounds: Normal heart sounds. No gallop.   Pulmonary:     Effort: Pulmonary effort is normal. No respiratory distress.     Breath sounds: Normal breath sounds. No wheezing.     Comments: Good air exch Chest:     Chest wall: No tenderness.  Abdominal:     General: Bowel sounds are normal. There is no distension or abdominal bruit.     Palpations: Abdomen is soft. There is hepatomegaly. There is  no splenomegaly or mass.     Tenderness: There is no abdominal tenderness. There is no guarding.     Hernia: No hernia is present.  Genitourinary:    Comments: Breast exam: No mass, nodules, thickening,  tenderness, bulging, retraction, inflamation, nipple discharge or skin changes noted.  No axillary or clavicular LA.     Musculoskeletal:        General: No tenderness. Normal range of motion.     Cervical back: Normal range of motion and neck supple. No rigidity. No muscular tenderness.     Right lower leg: No edema.     Left lower leg: No edema.     Comments: No kyphosis   Lymphadenopathy:     Cervical: No cervical adenopathy.  Skin:    General: Skin is warm and dry.     Coloration: Skin is not pale.     Findings: No erythema or rash.     Comments: Some lentigines  Some telangectasia   Neurological:     Mental Status: She is alert. Mental status is at baseline.     Cranial Nerves: No cranial nerve deficit.     Motor: No abnormal muscle tone.     Coordination: Coordination normal.     Gait: Gait normal.     Deep Tendon Reflexes: Reflexes are normal and symmetric. Reflexes normal.  Psychiatric:        Mood and Affect: Mood normal.        Cognition and Memory: Cognition and memory normal.           Assessment & Plan:   Problem List Items Addressed This Visit      Cardiovascular and Mediastinum   Essential hypertension - Primary    bp in fair control at this time  BP Readings from Last 1 Encounters:  09/07/20 122/68   No changes needed Most recent labs reviewed  Disc lifstyle change with low sodium diet and exercise  Plan to continue metoprolol xl 25 mg daily and maxzide 37.5-25 mg daily      Relevant Medications   metoprolol succinate (TOPROL-XL) 100 MG 24 hr tablet   triamterene-hydrochlorothiazide (MAXZIDE-25) 37.5-25 MG tablet     Digestive   Hepatic cirrhosis (HCC)    Followed by GI Liver labs are stable to slt improved         Endocrine   Hypothyroidism    Hypothyroidism  Pt has some increased sluggishness Taking 175 mcg of levothyroxine daily Lab Results  Component Value Date   TSH 13.64 (H) 08/24/2020   plan to increase to 200 mcg  Re check  lab in about a month         Relevant Medications   levothyroxine (SYNTHROID) 200 MCG tablet   metoprolol succinate (TOPROL-XL) 100 MG 24 hr tablet   Other Relevant Orders   TSH     Other   Hyperlipidemia    LDL is down this draw to 103 Disc goals for lipids and reasons to control them Rev last labs with pt Rev low sat fat diet in detail Commended       Relevant Medications   metoprolol succinate (TOPROL-XL) 100 MG 24 hr tablet   triamterene-hydrochlorothiazide (MAXZIDE-25) 37.5-25 MG tablet   Obesity    Discussed how this problem influences overall health and the risks it imposes  Reviewed plan for weight loss with lower calorie diet (via better food choices and also portion control or program like weight watchers) and exercise building up to or more  than 30 minutes 5 days per week including some aerobic activity   Not much time for self care lately      Thrombocytopenia ([place])    No new bleeding or bruising  Followed by heme/onc plt ct of 88      Colon cancer screening    Colonoscopy 11/17 with 5 y recall (will be this coming November) Pt aware and will call if referral is needed      Elevated random blood glucose level    Lab Results  Component Value Date   HGBA1C 4.5 (L) 08/24/2020   Good  disc imp of low glycemic diet and wt loss to prevent DM2       Caregiver stress    Caring for her husband with cancer Struggling  No time for self care  Makes her eat poorly  Ref made for counseling  Reviewed stressors/ coping techniques/symptoms/ support sources/ tx options and side effects in detail today       Relevant Orders   Ambulatory referral to Psychology

## 2020-09-08 ENCOUNTER — Other Ambulatory Visit: Payer: Self-pay | Admitting: Oncology

## 2020-09-08 DIAGNOSIS — Z23 Encounter for immunization: Secondary | ICD-10-CM | POA: Diagnosis not present

## 2020-09-24 ENCOUNTER — Encounter: Payer: Self-pay | Admitting: Family Medicine

## 2020-10-07 ENCOUNTER — Other Ambulatory Visit (INDEPENDENT_AMBULATORY_CARE_PROVIDER_SITE_OTHER): Payer: Medicare Other

## 2020-10-07 ENCOUNTER — Other Ambulatory Visit: Payer: Self-pay

## 2020-10-07 DIAGNOSIS — E039 Hypothyroidism, unspecified: Secondary | ICD-10-CM

## 2020-10-07 LAB — TSH: TSH: 1.95 u[IU]/mL (ref 0.35–4.50)

## 2020-10-08 ENCOUNTER — Telehealth: Payer: Self-pay | Admitting: *Deleted

## 2020-10-08 MED ORDER — LEVOTHYROXINE SODIUM 200 MCG PO TABS: 200.0000 ug | ORAL_TABLET | Freq: Every day | ORAL | 0 refills | Status: DC

## 2020-10-08 MED ORDER — LEVOTHYROXINE SODIUM 200 MCG PO TABS: 200.0000 ug | ORAL_TABLET | Freq: Every day | ORAL | 3 refills | Status: DC

## 2020-10-08 NOTE — Telephone Encounter (Signed)
-----   Message from Abner Greenspan, MD sent at 10/07/2020  8:30 PM EDT ----- Tsh is in the nl range Continue current levothyroxine dose and refill for year if needed

## 2020-10-21 ENCOUNTER — Inpatient Hospital Stay (HOSPITAL_COMMUNITY): Payer: Medicare Other

## 2020-10-21 ENCOUNTER — Emergency Department (HOSPITAL_COMMUNITY): Payer: Medicare Other

## 2020-10-21 ENCOUNTER — Telehealth: Payer: Self-pay | Admitting: Family Medicine

## 2020-10-21 ENCOUNTER — Inpatient Hospital Stay (HOSPITAL_COMMUNITY)
Admission: EM | Admit: 2020-10-21 | Discharge: 2020-10-23 | DRG: 641 | Disposition: A | Discharge status: Home / Self Care | Payer: Medicare Other | Attending: Internal Medicine | Admitting: Internal Medicine

## 2020-10-21 ENCOUNTER — Encounter (HOSPITAL_COMMUNITY): Payer: Self-pay | Admitting: Emergency Medicine

## 2020-10-21 DIAGNOSIS — N182 Chronic kidney disease, stage 2 (mild): Secondary | ICD-10-CM | POA: Diagnosis present

## 2020-10-21 DIAGNOSIS — E876 Hypokalemia: Secondary | ICD-10-CM | POA: Diagnosis present

## 2020-10-21 DIAGNOSIS — R600 Localized edema: Secondary | ICD-10-CM

## 2020-10-21 DIAGNOSIS — Z91041 Radiographic dye allergy status: Secondary | ICD-10-CM | POA: Diagnosis not present

## 2020-10-21 DIAGNOSIS — Z809 Family history of malignant neoplasm, unspecified: Secondary | ICD-10-CM

## 2020-10-21 DIAGNOSIS — I878 Other specified disorders of veins: Secondary | ICD-10-CM | POA: Diagnosis present

## 2020-10-21 DIAGNOSIS — I10 Essential (primary) hypertension: Secondary | ICD-10-CM | POA: Diagnosis not present

## 2020-10-21 DIAGNOSIS — E8770 Fluid overload, unspecified: Secondary | ICD-10-CM | POA: Diagnosis not present

## 2020-10-21 DIAGNOSIS — Z90710 Acquired absence of both cervix and uterus: Secondary | ICD-10-CM | POA: Diagnosis not present

## 2020-10-21 DIAGNOSIS — H409 Unspecified glaucoma: Secondary | ICD-10-CM | POA: Diagnosis present

## 2020-10-21 DIAGNOSIS — Z83511 Family history of glaucoma: Secondary | ICD-10-CM

## 2020-10-21 DIAGNOSIS — M7989 Other specified soft tissue disorders: Secondary | ICD-10-CM | POA: Diagnosis not present

## 2020-10-21 DIAGNOSIS — K7581 Nonalcoholic steatohepatitis (NASH): Secondary | ICD-10-CM | POA: Diagnosis present

## 2020-10-21 DIAGNOSIS — Z853 Personal history of malignant neoplasm of breast: Secondary | ICD-10-CM | POA: Diagnosis not present

## 2020-10-21 DIAGNOSIS — R0602 Shortness of breath: Secondary | ICD-10-CM | POA: Diagnosis not present

## 2020-10-21 DIAGNOSIS — K219 Gastro-esophageal reflux disease without esophagitis: Secondary | ICD-10-CM | POA: Diagnosis present

## 2020-10-21 DIAGNOSIS — Z885 Allergy status to narcotic agent status: Secondary | ICD-10-CM | POA: Diagnosis not present

## 2020-10-21 DIAGNOSIS — R7989 Other specified abnormal findings of blood chemistry: Secondary | ICD-10-CM | POA: Diagnosis present

## 2020-10-21 DIAGNOSIS — Z87891 Personal history of nicotine dependence: Secondary | ICD-10-CM

## 2020-10-21 DIAGNOSIS — Z20822 Contact with and (suspected) exposure to covid-19: Secondary | ICD-10-CM | POA: Diagnosis present

## 2020-10-21 DIAGNOSIS — R7402 Elevation of levels of lactic acid dehydrogenase (LDH): Secondary | ICD-10-CM | POA: Diagnosis present

## 2020-10-21 DIAGNOSIS — Z79899 Other long term (current) drug therapy: Secondary | ICD-10-CM

## 2020-10-21 DIAGNOSIS — Z923 Personal history of irradiation: Secondary | ICD-10-CM | POA: Diagnosis not present

## 2020-10-21 DIAGNOSIS — E039 Hypothyroidism, unspecified: Secondary | ICD-10-CM | POA: Diagnosis present

## 2020-10-21 DIAGNOSIS — Z8249 Family history of ischemic heart disease and other diseases of the circulatory system: Secondary | ICD-10-CM

## 2020-10-21 DIAGNOSIS — I129 Hypertensive chronic kidney disease with stage 1 through stage 4 chronic kidney disease, or unspecified chronic kidney disease: Secondary | ICD-10-CM | POA: Diagnosis present

## 2020-10-21 DIAGNOSIS — Z66 Do not resuscitate: Secondary | ICD-10-CM | POA: Diagnosis present

## 2020-10-21 DIAGNOSIS — R7401 Elevation of levels of liver transaminase levels: Secondary | ICD-10-CM | POA: Diagnosis present

## 2020-10-21 DIAGNOSIS — Z79811 Long term (current) use of aromatase inhibitors: Secondary | ICD-10-CM

## 2020-10-21 DIAGNOSIS — I509 Heart failure, unspecified: Secondary | ICD-10-CM | POA: Diagnosis not present

## 2020-10-21 DIAGNOSIS — E785 Hyperlipidemia, unspecified: Secondary | ICD-10-CM | POA: Diagnosis present

## 2020-10-21 DIAGNOSIS — R062 Wheezing: Secondary | ICD-10-CM | POA: Diagnosis not present

## 2020-10-21 DIAGNOSIS — Z9049 Acquired absence of other specified parts of digestive tract: Secondary | ICD-10-CM | POA: Diagnosis not present

## 2020-10-21 DIAGNOSIS — C50212 Malignant neoplasm of upper-inner quadrant of left female breast: Secondary | ICD-10-CM

## 2020-10-21 DIAGNOSIS — Z85828 Personal history of other malignant neoplasm of skin: Secondary | ICD-10-CM

## 2020-10-21 DIAGNOSIS — I110 Hypertensive heart disease with heart failure: Secondary | ICD-10-CM | POA: Diagnosis not present

## 2020-10-21 DIAGNOSIS — R200 Anesthesia of skin: Secondary | ICD-10-CM | POA: Diagnosis not present

## 2020-10-21 DIAGNOSIS — K7460 Unspecified cirrhosis of liver: Secondary | ICD-10-CM | POA: Diagnosis not present

## 2020-10-21 DIAGNOSIS — R0609 Other forms of dyspnea: Secondary | ICD-10-CM | POA: Diagnosis not present

## 2020-10-21 DIAGNOSIS — R609 Edema, unspecified: Secondary | ICD-10-CM

## 2020-10-21 DIAGNOSIS — R188 Other ascites: Secondary | ICD-10-CM | POA: Diagnosis not present

## 2020-10-21 DIAGNOSIS — Z79890 Hormone replacement therapy (postmenopausal): Secondary | ICD-10-CM

## 2020-10-21 DIAGNOSIS — Z170 Estrogen receptor positive status [ER+]: Secondary | ICD-10-CM

## 2020-10-21 LAB — HEPATIC FUNCTION PANEL
ALT: 22 U/L (ref 0–44)
AST: 49 U/L — ABNORMAL HIGH (ref 15–41)
Albumin: 3.4 g/dL — ABNORMAL LOW (ref 3.5–5.0)
Alkaline Phosphatase: 105 U/L (ref 38–126)
Bilirubin, Direct: 0.8 mg/dL — ABNORMAL HIGH (ref 0.0–0.2)
Indirect Bilirubin: 2 mg/dL — ABNORMAL HIGH (ref 0.3–0.9)
Total Bilirubin: 2.8 mg/dL — ABNORMAL HIGH (ref 0.3–1.2)
Total Protein: 6.7 g/dL (ref 6.5–8.1)

## 2020-10-21 LAB — CBC
HCT: 41.1 % (ref 36.0–46.0)
HCT: 40.6 % (ref 36.0–46.0)
Hemoglobin: 13.8 g/dL (ref 12.0–15.0)
Hemoglobin: 14 g/dL (ref 12.0–15.0)
MCH: 34.9 pg — ABNORMAL HIGH (ref 26.0–34.0)
MCH: 35.6 pg — ABNORMAL HIGH (ref 26.0–34.0)
MCHC: 33.6 g/dL (ref 30.0–36.0)
MCHC: 34.5 g/dL (ref 30.0–36.0)
MCV: 104.1 fL — ABNORMAL HIGH (ref 80.0–100.0)
MCV: 103.3 fL — ABNORMAL HIGH (ref 80.0–100.0)
Platelets: 96 10*3/uL — ABNORMAL LOW (ref 150–400)
Platelets: 106 10*3/uL — ABNORMAL LOW (ref 150–400)
RBC: 3.95 MIL/uL (ref 3.87–5.11)
RBC: 3.93 MIL/uL (ref 3.87–5.11)
RDW: 13.3 % (ref 11.5–15.5)
RDW: 13.2 % (ref 11.5–15.5)
WBC: 4.4 10*3/uL (ref 4.0–10.5)
WBC: 5.8 10*3/uL (ref 4.0–10.5)
nRBC: 0 % (ref 0.0–0.2)
nRBC: 0 % (ref 0.0–0.2)

## 2020-10-21 LAB — BASIC METABOLIC PANEL
Anion gap: 10 (ref 5–15)
BUN: 6 mg/dL — ABNORMAL LOW (ref 8–23)
CO2: 25 mmol/L (ref 22–32)
Calcium: 9.1 mg/dL (ref 8.9–10.3)
Chloride: 103 mmol/L (ref 98–111)
Creatinine, Ser: 0.59 mg/dL (ref 0.44–1.00)
GFR, Estimated: >60 mL/min (ref >60)
Glucose, Bld: 108 mg/dL — ABNORMAL HIGH (ref 70–99)
Potassium: 3.3 mmol/L — ABNORMAL LOW (ref 3.5–5.1)
Sodium: 138 mmol/L (ref 135–145)

## 2020-10-21 LAB — CREATININE, SERUM
Creatinine, Ser: 0.59 mg/dL (ref 0.44–1.00)
GFR, Estimated: >60 mL/min (ref >60)

## 2020-10-21 LAB — BRAIN NATRIURETIC PEPTIDE: B Natriuretic Peptide: 195 pg/mL — ABNORMAL HIGH (ref 0.0–100.0)

## 2020-10-21 LAB — TROPONIN I (HIGH SENSITIVITY)
Troponin I (High Sensitivity): 4 ng/L (ref <18)
Troponin I (High Sensitivity): 4 ng/L (ref <18)
Troponin I (High Sensitivity): 5 ng/L (ref <18)
Troponin I (High Sensitivity): 4 ng/L (ref <18)

## 2020-10-21 LAB — D-DIMER, QUANTITATIVE: D-Dimer, Quant: 15.88 ug/mL-FEU — ABNORMAL HIGH (ref 0.00–0.50)

## 2020-10-21 LAB — RESP PANEL BY RT-PCR (FLU A&B, COVID) ARPGX2
Influenza A by PCR: NEGATIVE
Influenza B by PCR: NEGATIVE
SARS Coronavirus 2 by RT PCR: NEGATIVE

## 2020-10-21 LAB — TSH: TSH: 0.634 u[IU]/mL (ref 0.350–4.500)

## 2020-10-21 MED ORDER — METOPROLOL SUCCINATE ER 100 MG PO TB24
100.0000 mg | ORAL_TABLET | Freq: Every day | ORAL | Status: DC
  Administered 2020-10-21 – 2020-10-23 (×3): 100 mg via ORAL
  Filled 2020-10-21 (×3): qty 1

## 2020-10-21 MED ORDER — VITAMIN D 25 MCG (1000 UNIT) PO TABS
2000.0000 [IU] | ORAL_TABLET | Freq: Every day | ORAL | Status: DC
  Administered 2020-10-22 – 2020-10-23 (×2): 2000 [IU] via ORAL
  Filled 2020-10-21 (×2): qty 2

## 2020-10-21 MED ORDER — FUROSEMIDE 10 MG/ML IJ SOLN
40.0000 mg | Freq: Every day | INTRAMUSCULAR | Status: DC
  Administered 2020-10-21 – 2020-10-22 (×2): 40 mg via INTRAVENOUS
  Filled 2020-10-21 (×2): qty 4

## 2020-10-21 MED ORDER — LEVOTHYROXINE SODIUM 100 MCG PO TABS
200.0000 ug | ORAL_TABLET | Freq: Every day | ORAL | Status: DC
  Administered 2020-10-22 – 2020-10-23 (×2): 200 ug via ORAL
  Filled 2020-10-21 (×2): qty 2

## 2020-10-21 MED ORDER — ANASTROZOLE 1 MG PO TABS
1.0000 mg | ORAL_TABLET | Freq: Every day | ORAL | Status: DC
  Administered 2020-10-21 – 2020-10-23 (×3): 1 mg via ORAL
  Filled 2020-10-21 (×4): qty 1

## 2020-10-21 MED ORDER — ENOXAPARIN SODIUM 40 MG/0.4ML IJ SOSY
40.0000 mg | PREFILLED_SYRINGE | INTRAMUSCULAR | Status: DC
  Administered 2020-10-21 – 2020-10-22 (×2): 40 mg via SUBCUTANEOUS
  Filled 2020-10-21 (×2): qty 0.4

## 2020-10-21 MED ORDER — POTASSIUM CHLORIDE CRYS ER 20 MEQ PO TBCR
40.0000 meq | EXTENDED_RELEASE_TABLET | Freq: Once | ORAL | Status: AC
  Administered 2020-10-21: 40 meq via ORAL
  Filled 2020-10-21: qty 2

## 2020-10-21 NOTE — H&P (Signed)
History and Physical    Selena Johnson MOQ:[id number] DOB: 1950/11/21 DOA: 10/21/2020  PCP: Abner Greenspan, MD  Patient coming from: Home  Chief Complaint: LE swelling  HPI: Selena Johnson is a 70 y.o. female with medical history significant of HTN, breast cancer, hypothyroid, cirrhosis who presents with worsening LE edema. Pt reports having thyroid regimen changed abruptly several months ago. Shortly afterwards, pt reports gradually worsening LE edema that seems to improve with LE elevation and is improved first thing in the morning, worse at the end of the day after being on her feet. Pt reports feeling more "bloated" than usual with subjective wt gain noted. Denies chest pain. Has been taking diuretics, but repots no significant improvement.  ED Course: In the ED, pt was noted to have stable vital signs. D.dimer was noted to be elevated at 15 with bnp just under 200. Pt noted to have unremarkable CXR. LE dopplers were neg for DVT. 2d echo was ordered, pending. Hospitialist consulted for consideration for admission  Review of Systems:  Review of Systems  Constitutional: Negative for chills, fever and malaise/fatigue.  HENT: Negative for ear pain and tinnitus.   Eyes: Negative for double vision, photophobia and pain.  Respiratory: Positive for wheezing. Negative for hemoptysis and sputum production.   Cardiovascular: Positive for orthopnea and leg swelling. Negative for chest pain.  Gastrointestinal: Negative for abdominal pain, nausea and vomiting.  Genitourinary: Negative for frequency and urgency.  Musculoskeletal: Negative for back pain, falls and neck pain.  Neurological: Negative for tremors, sensory change, seizures and loss of consciousness.  Psychiatric/Behavioral: Negative for hallucinations and memory loss.    Past Medical History:  Diagnosis Date  . Allergy   . Cancer (Interior)    basal cell skin CA  . Chronic kidney disease    hx of kidney stones  . Conjunctivitis     chronic  . Fatty liver 2015  . GERD (gastroesophageal reflux disease)   . Glaucoma   . Hearing loss in left ear   . History of kidney stones   . Hyperlipidemia    no per pt  . Hypertension   . Menopausal syndrome   . NASH (nonalcoholic steatohepatitis)    with cirrhosis and mild esoph varicies  . Primary hypothyroidism     Past Surgical History:  Procedure Laterality Date  . ABDOMINAL HYSTERECTOMY     partial ? prolapse  . BREAST LUMPECTOMY WITH RADIOACTIVE SEED AND SENTINEL LYMPH NODE BIOPSY Left 07/12/2018   Procedure: LEFT BREAST LUMPECTOMY WITH RADIOACTIVE SEED AND LEFT AXILLARY SENTINEL LYMPH NODE BIOPSY;  Surgeon: Rolm Bookbinder, MD;  Location: [facility];  Service: General;  Laterality: Left;  . CHOLECYSTECTOMY    . ESOPHAGOGASTRODUODENOSCOPY    . TONSILLECTOMY       reports that she quit smoking about 38 years ago. She has never used smokeless tobacco. She reports current alcohol use. She reports that she does not use drugs.  Allergies  Allergen Reactions  . Codeine     REACTION: Throat swelling  . Ivp Dye [Iodinated Diagnostic Agents] Swelling    Family History  Problem Relation Age of Onset  . Heart disease Mother        CAD  . Hypertension Mother   . Glaucoma Mother   . Heart disease Father        CAD  . Diabetes Father   . Hypertension Father   . Cancer Father        ? CA - squamous cell  carcinoma  . Colon cancer Neg Hx   . Esophageal cancer Neg Hx   . Rectal cancer Neg Hx   . Stomach cancer Neg Hx     Prior to Admission medications   Medication Sig Start Date End Date Taking? Authorizing Provider  anastrozole (ARIMIDEX) 1 MG tablet Take 1 tablet by mouth daily Patient taking differently: Take 1 mg by mouth daily. 09/08/20  Yes Magrinat, Virgie Dad, MD  Biotin 2500 MCG CAPS Take 5,000 mcg by mouth daily.   Yes [provider]  Cholecalciferol (VITAMIN D3) 2000 units TABS Take 2,000 Units by mouth daily.   Yes [provider]   levothyroxine (SYNTHROID) 200 MCG tablet Take 1 tablet (200 mcg total) by mouth daily before breakfast. 10/08/20  Yes Tower, [name], MD  loratadine (CLARITIN) 10 MG tablet Take 10 mg by mouth daily as needed.   Yes [provider]  metoprolol succinate (TOPROL-XL) 100 MG 24 hr tablet Take 1 by mouth daily with or after a meal Patient taking differently: Take 100 mg by mouth daily. 09/07/20  Yes Tower, [name], MD  Multiple Vitamin (MULTIVITAMIN) capsule Take 1 capsule by mouth daily.   Yes [provider]  potassium chloride SA (KLOR-CON) 20 MEQ tablet Take 1 tablet by mouth every day Patient taking differently: Take 20 mEq by mouth daily. 09/07/20  Yes Tower, [name], MD  triamterene-hydrochlorothiazide (MAXZIDE-25) 37.5-25 MG tablet Take 1 tablet by mouth daily. 09/07/20  Yes Tower, [name], MD    Physical Exam: Vitals:   10/21/20 1500 10/21/20 1500 10/21/20 1600 10/21/20 1615  BP: (!) 144/96  (!) 146/83 (!) 154/79  Pulse: 90  79 75  Resp: (!) 21  (!) 23 (!) 22  Temp:      TempSrc:      SpO2: 98%  100% 100%  Weight:  97.5 kg    Height:  5' 5"  (1.651 m)      Constitutional: NAD, calm, comfortable Vitals:   10/21/20 1500 10/21/20 1500 10/21/20 1600 10/21/20 1615  BP: (!) 144/96  (!) 146/83 (!) 154/79  Pulse: 90  79 75  Resp: (!) 21  (!) 23 (!) 22  Temp:      TempSrc:      SpO2: 98%  100% 100%  Weight:  97.5 kg    Height:  5' 5"  (1.651 m)     Eyes: PERRL, lids and conjunctivae normal ENMT: Mucous membranes are moist. Posterior pharynx clear of any exudate or lesions.Normal dentition.  Neck: normal, supple, no masses, no thyromegaly Respiratory: clear to auscultation bilaterally, no wheezing, no crackles. Normal respiratory effort. No accessory muscle use.  Cardiovascular: Regular rate and rhythm, s1, s2 Abdomen: no tenderness, no masses palpated. Musculoskeletal: BLE edema with evidence of venous stasis Skin: no rashes, lesions, ulcers. No induration Neurologic:  CN 2-12 grossly intact. Sensation intact,  Strength 5/5 in all 4.  Psychiatric: Normal judgment and insight. Alert and oriented x 3. Normal mood.    Labs on Admission: I have personally reviewed following labs and imaging studies  CBC: Recent Labs  Lab 10/21/20 1019  WBC 4.4  HGB 13.8  HCT 41.1  MCV 104.1*  PLT 96*   Basic Metabolic Panel: Recent Labs  Lab 10/21/20 1019  NA 138  K 3.3*  CL 103  CO2 25  GLUCOSE 108*  BUN 6*  CREATININE 0.59  CALCIUM 9.1   GFR: Estimated Creatinine Clearance: 73.5 mL/min (by C-G formula based on SCr of 0.59 mg/dL).  Liver Function Tests: Recent Labs  Lab 10/21/20 1314  AST 49*  ALT 22  ALKPHOS 105  BILITOT 2.8*  PROT 6.7  ALBUMIN 3.4*   No results for input(s): LIPASE, AMYLASE in the last 168 hours. No results for input(s): AMMONIA in the last 168 hours. Coagulation Profile: No results for input(s): INR, PROTIME in the last 168 hours. Cardiac Enzymes: No results for input(s): CKTOTAL, CKMB, CKMBINDEX, TROPONINI in the last 168 hours. BNP (last 3 results) No results for input(s): PROBNP in the last 8760 hours. HbA1C: No results for input(s): HGBA1C in the last 72 hours. CBG: No results for input(s): GLUCAP in the last 168 hours. Lipid Profile: No results for input(s): CHOL, HDL, LDLCALC, TRIG, CHOLHDL, LDLDIRECT in the last 72 hours. Thyroid Function Tests: No results for input(s): TSH, T4TOTAL, FREET4, T3FREE, THYROIDAB in the last 72 hours. Anemia Panel: No results for input(s): VITAMINB12, FOLATE, FERRITIN, TIBC, IRON, RETICCTPCT in the last 72 hours. Urine analysis:    Component Value Date/Time   BILIRUBINUR neg. 05/21/2013 1623   PROTEINUR neg. 05/21/2013 1623   UROBILINOGEN 0.2 05/21/2013 1623   NITRITE neg. 05/21/2013 1623   LEUKOCYTESUR small (1+) 05/21/2013 1623   Sepsis Labs: !!!!!!!!!!!!!!!!!!!!!!!!!!!!!!!!!!!!!!!!!!!! @LABRCNTIP (procalcitonin:4,lacticidven:4) )No results found for this or any previous  visit (from the past 240 hour(s)).   Radiological Exams on Admission: DG Chest 2 View  Result Date: 10/21/2020 CLINICAL DATA:  70 year old female with increasing lower extremity swelling, numbness, shortness of breath and wheezing. Former smoker. EXAM: CHEST - 2 VIEW COMPARISON:  Chest radiograph 12/23/2004. FINDINGS: Lung volumes and mediastinal contours remain normal. Visualized tracheal air column is within normal limits. No pneumothorax, pleural effusion, pulmonary edema or confluent pulmonary opacity. Mild diffuse interstitial markings have increased since 2006, probably smoking related. No acute osseous abnormality identified. Cholecystectomy clips in the upper abdomen. Negative visible bowel gas pattern. IMPRESSION: No acute cardiopulmonary abnormality. Electronically Signed   By: Genevie Ann M.D.   On: 10/21/2020 11:15   VAS [place] LOWER EXTREMITY VENOUS (DVT) (ONLY [facility] & [facility])  Result Date: 10/21/2020  Lower Venous DVT Study Patient Name:  RUTHELLA KIRCHMAN  Date of Exam:   10/21/2020 Medical Rec #: [id number]       Accession #:    [id number] Date of Birth: 15-May-1951        Patient Gender: F Patient Age:   70Y Exam Location:  [facility] [place] Procedure:      VAS [place] LOWER EXTREMITY VENOUS (DVT) Referring Phys: [place] --------------------------------------------------------------------------------  Indications: Edema.  Limitations: Poor ultrasound/tissue interface. Comparison Study: no prior Performing Technologist: Abram Sander RVS  Examination Guidelines: A complete evaluation includes B-mode imaging, spectral Doppler, color Doppler, and power Doppler as needed of all accessible portions of each vessel. Bilateral testing is considered an integral part of a complete examination. Limited examinations for reoccurring indications may be performed as noted. The reflux portion of the exam is performed with the patient in reverse Trendelenburg.   +---------+---------------+---------+-----------+----------+--------------+ RIGHT    CompressibilityPhasicitySpontaneityPropertiesThrombus Aging +---------+---------------+---------+-----------+----------+--------------+ CFV      Full           Yes      Yes                                 +---------+---------------+---------+-----------+----------+--------------+ SFJ      Full                                                        +---------+---------------+---------+-----------+----------+--------------+  FV Prox  Full                                                        +---------+---------------+---------+-----------+----------+--------------+ FV Mid                  Yes      Yes                                 +---------+---------------+---------+-----------+----------+--------------+ FV Distal               Yes      Yes                                 +---------+---------------+---------+-----------+----------+--------------+ PFV      Full                                                        +---------+---------------+---------+-----------+----------+--------------+ POP      Full           Yes      Yes                                 +---------+---------------+---------+-----------+----------+--------------+ PTV      Full                                                        +---------+---------------+---------+-----------+----------+--------------+ PERO     Full                                                        +---------+---------------+---------+-----------+----------+--------------+   +---------+---------------+---------+-----------+----------+--------------+ LEFT     CompressibilityPhasicitySpontaneityPropertiesThrombus Aging +---------+---------------+---------+-----------+----------+--------------+ CFV      Full           Yes      Yes                                  +---------+---------------+---------+-----------+----------+--------------+ SFJ      Full                                                        +---------+---------------+---------+-----------+----------+--------------+ FV Prox  Full                                                        +---------+---------------+---------+-----------+----------+--------------+  FV Mid                  Yes      Yes                                 +---------+---------------+---------+-----------+----------+--------------+ FV Distal               Yes      Yes                                 +---------+---------------+---------+-----------+----------+--------------+ PFV      Full                                                        +---------+---------------+---------+-----------+----------+--------------+ POP      Full           Yes      Yes                                 +---------+---------------+---------+-----------+----------+--------------+ PTV      Full                                                        +---------+---------------+---------+-----------+----------+--------------+ PERO     Full                                                        +---------+---------------+---------+-----------+----------+--------------+    Summary: BILATERAL: - No evidence of deep vein thrombosis seen in the lower extremities, bilaterally. -No evidence of popliteal cyst, bilaterally.   *See table(s) above for measurements and observations.    Preliminary     EKG: Independently reviewed. Sinus with PAC's  Assessment/Plan Principal Problem:   Volume overload Active Problems:   Hyperlipidemia   Essential hypertension   TRANSAMINASES, SERUM, ELEVATED   Hepatic cirrhosis (HCC)   Malignant neoplasm of upper-inner quadrant of left breast in female, estrogen receptor positive ([place])   1. LE edema, venous stasis vs new diagnosis of acute CHF 1. LE edema noted that is  improved with LE elevation and evidence of hemosiderin skin changes suggesting venous stasis 2. However, pt does report increased orthopnea, decreased exercise tolerance, subjective wt gain in the setting of recently changing thyroid regimen 3. Presenting BLE edema with bnp just under 200 4. Will check TSH. Was 1.95 on 10/07/20, but 13.6 on 08/24/20 5. Cont on scheduled IV lasix, follow I/o and daily wts 6. 2d echo was ordered in ED, pending 7. Recheck bmet in AM 8. Recommend LE elevation 2. HLD 1. Not on statin per home meds 3. HTN 1. BP stable at this time 2. Cont on lasix per above 3. Cont home metoprolol 4. Hx cirrhosis 1. Presenting total bili of 2.0 2. Will check RUQ [place] 3. Avoid hepatotoxic meds for now  5. Hx breast cancer 1. Followed by Dr. Jana Hakim as outpatient, last seen 03/17/20 2. S/p lumpectomy and adjuvant radiation and is on anastrozole 6. Elevated D-dimer 1. Presenting d-dimer in excess of 15.  2. LE dopplers neg for dvt, reviewed 3. Pt denies sob or chest pain 4. Will f/u on RUQ [place] above 5. If pt becomes sob or has chest pain, would have low threshold to check CT chest  DVT prophylaxis: lovenox subq  Code Status: DNR/DNI, confirmed with patient Family Communication: Pt in room, family not at bedside  Disposition Plan:   Consults called:  Admission status: Inpatient, as it would likely require greater than 2 midnight stay to diurese patient   Marylu Lund MD [facility] Pager On Amion  If 7PM-7AM, please contact night-coverage  10/21/2020, 5:02 PM

## 2020-10-21 NOTE — ED Notes (Signed)
Pt ambulated in room, SpO2 remained 97% on RA

## 2020-10-21 NOTE — Telephone Encounter (Signed)
I spoke with pt; for 3 - 4 days pt has had bilateral swollen legs from hip to feet; now pt is having weeping of both legs (clear droplets). Both legs feel tight like they are going to burst. Pt has never had this before like this. Pt has SOB with exertion. Pt has dry cough with wheezing. Can hear pt wheezing on phone now. Pt has upper abd swelling also; pt said she has not had CHF before but is afraid that is what she is having now. No other covid symptoms other than what is listed above. No CP. Pt declines 911 and her husband is going to take her to [facility] ED now. Sending note to Dr Glori Bickers.

## 2020-10-21 NOTE — Telephone Encounter (Signed)
Agree with that advisement, this is new. Will watch for correspondence.

## 2020-10-21 NOTE — Telephone Encounter (Signed)
Selena Johnson called in and stated that her legs are swollen and they are leaking, left leg was leaking for the past 3-4 days from her knee to her hip felt numb to the touch. She put 3 pillows under her legs and some swelling have went down. She stated she not sure if it due to the medication change as far as dosage. I am sending her to [facility]   Please advise

## 2020-10-21 NOTE — ED Notes (Signed)
Pt ambulated independently to bathroom

## 2020-10-21 NOTE — ED Triage Notes (Signed)
Pt sent her by [facility] for possible chf , pt legs arfe swollen and some sob while walking

## 2020-10-21 NOTE — ED Provider Notes (Signed)
[facility] EMERGENCY DEPARTMENT Provider Note   CSN: [id number] Arrival date & time: 10/21/20  1001     History No chief complaint on file. Leg swelling  Selena Johnson is a 70 y.o. female with PMH of CKD, hypertension, hypothyroidism, breast cancer and GERD who presents for evaluation of lower extremity swelling. Patient reports she has had a gradual swelling of her lower extremities for the last few weeks. Swelling has progressed to the point where she has noticed blisters and oozing from her legs.  She has also had associated leg tightness, shortness of breath, abdominal distention and cough.  She denies any chest pain, dizziness, headaches, abdominal pain or dysuria.  Patient states she has a compression stocking but has a difficult time putting it on. She was told she has fatty liver and was followed by [facility].  Past Medical History:  Diagnosis Date  . Allergy   . Cancer ([place])    basal cell skin CA  . Chronic kidney disease    hx of kidney stones  . Conjunctivitis    chronic  . Fatty liver 2015  . GERD (gastroesophageal reflux disease)   . Glaucoma   . Hearing loss in left ear   . History of kidney stones   . Hyperlipidemia    no per pt  . Hypertension   . Menopausal syndrome   . NASH (nonalcoholic steatohepatitis)    with cirrhosis and mild esoph varicies  . Primary hypothyroidism     Patient Active Problem List   Diagnosis Date Noted  . Caregiver stress 09/07/2020  . Secondary esophageal varices without bleeding ([place]) 09/07/2020  . Malignant neoplasm of upper-inner quadrant of left breast in female, estrogen receptor positive ([place]) 06/27/2018  . Left breast mass 06/17/2018  . Elevated random blood glucose level 07/28/2017  . Skin cancer screening 05/05/2017  . Colon cancer screening 07/21/2015  . Estrogen deficiency 07/21/2015  . Calcification of right breast 05/06/2015  . Encounter for Medicare annual wellness exam 07/20/2014  .  Unspecified constipation 10/25/2012  . Hepatic cirrhosis ([place]) 10/23/2012  . Hypokalemia 07/13/2012  . Thrombocytopenia ([place]) 11/10/2010  . Routine general medical examination at a health care facility 11/04/2010  . Hyperlipidemia 03/18/2008  . Obesity 03/18/2008  . TRANSAMINASES, SERUM, ELEVATED 03/18/2008  . MENOPAUSE-RELATED VASOMOTOR SYMPTOMS, HOT FLASHES 10/23/2006  . Hypothyroidism 10/11/2006  . GLAUCOMA 10/11/2006  . Essential hypertension 10/11/2006  . GERD 10/11/2006  . History of kidney stones 10/11/2006    Past Surgical History:  Procedure Laterality Date  . ABDOMINAL HYSTERECTOMY     partial ? prolapse  . BREAST LUMPECTOMY WITH RADIOACTIVE SEED AND SENTINEL LYMPH NODE BIOPSY Left 07/12/2018   Procedure: LEFT BREAST LUMPECTOMY WITH RADIOACTIVE SEED AND LEFT AXILLARY SENTINEL LYMPH NODE BIOPSY;  Surgeon: Rolm Bookbinder, MD;  Location: [facility];  Service: General;  Laterality: Left;  . CHOLECYSTECTOMY    . ESOPHAGOGASTRODUODENOSCOPY    . TONSILLECTOMY       OB History   No obstetric history on file.     Family History  Problem Relation Age of Onset  . Heart disease Mother        CAD  . Hypertension Mother   . Glaucoma Mother   . Heart disease Father        CAD  . Diabetes Father   . Hypertension Father   . Cancer Father        ? CA - squamous cell carcinoma  . Colon cancer Neg Hx   .  Esophageal cancer Neg Hx   . Rectal cancer Neg Hx   . Stomach cancer Neg Hx     Social History   Tobacco Use  . Smoking status: Former Smoker    Quit date: 06/06/1982    Years since quitting: 38.4  . Smokeless tobacco: Never Used  Vaping Use  . Vaping Use: Never used  Substance Use Topics  . Alcohol use: Yes    Alcohol/week: 0.0 standard drinks    Comment: wine- rare  . Drug use: No    Home Medications Prior to Admission medications   Medication Sig Start Date End Date Taking? Authorizing Provider  anastrozole (ARIMIDEX) 1 MG tablet Take 1 tablet by mouth daily  09/08/20   Magrinat, Virgie Dad, MD  Biotin 2500 MCG CAPS Take 1 capsule by mouth daily. 5,000 mcg    [provider]  Cholecalciferol (VITAMIN D3) 2000 units TABS Take 1 tablet by mouth daily.    [provider]  levothyroxine (SYNTHROID) 200 MCG tablet Take 1 tablet (200 mcg total) by mouth daily before breakfast. 10/08/20   Tower, [name], MD  loratadine (CLARITIN) 10 MG tablet Take 10 mg by mouth daily as needed.    [provider]  metoprolol succinate (TOPROL-XL) 100 MG 24 hr tablet Take 1 by mouth daily with or after a meal 09/07/20   Tower, [name], MD  Multiple Vitamin (MULTIVITAMIN) capsule Take 1 capsule by mouth daily.    [provider]  potassium chloride SA (KLOR-CON) 20 MEQ tablet Take 1 tablet by mouth every day 09/07/20   Tower, [name], MD  triamterene-hydrochlorothiazide (MAXZIDE-25) 37.5-25 MG tablet Take 1 tablet by mouth daily. 09/07/20   Tower, [name], MD    Allergies    Codeine and Ivp dye [iodinated diagnostic agents]  Review of Systems   Review of Systems  Constitutional: Negative for chills and fever.  Respiratory: Positive for cough and shortness of breath. Negative for wheezing.   Cardiovascular: Positive for leg swelling. Negative for chest pain and palpitations.  Gastrointestinal: Positive for abdominal distention. Negative for abdominal pain, nausea and vomiting.  Genitourinary: Negative for difficulty urinating.  Musculoskeletal: Negative for back pain.  Skin: Negative for rash.  Neurological: Negative for dizziness and headaches.   Physical Exam Updated Vital Signs BP (!) 171/78 (BP Location: Left Arm)   Pulse 87   Temp 98.7 F (37.1 C) (Oral)   Resp 16   SpO2 99%   Physical Exam Constitutional:      Appearance: Normal appearance.  HENT:     Head: Normocephalic and atraumatic.  Cardiovascular:     Rate and Rhythm: Normal rate and regular rhythm.     Pulses: Normal pulses.     Heart sounds: Normal heart sounds.   Pulmonary:     Effort: Pulmonary effort is normal. No respiratory distress.     Breath sounds: Rales present.  Abdominal:     General: Bowel sounds are normal. There is distension.     Tenderness: There is no abdominal tenderness.     Comments: Moderate abdominal distention  Musculoskeletal:     Cervical back: Normal range of motion.     Comments: 2+ bilateral lower extremity edema.  Inferior aspect of lower extremity with oozing blisters.  Neurological:     General: No focal deficit present.     Mental Status: She is alert and oriented to person, place, and time.  Psychiatric:        Mood and Affect: Mood  normal.     ED Results / Procedures / Treatments   Labs (all labs ordered are listed, but only abnormal results are displayed) Labs Reviewed  BASIC METABOLIC PANEL - Abnormal; Notable for the following components:      Result Value   Potassium 3.3 (*)    Glucose, Bld 108 (*)    BUN 6 (*)    All other components within normal limits  CBC - Abnormal; Notable for the following components:   MCV 104.1 (*)    MCH 34.9 (*)    Platelets 96 (*)    All other components within normal limits  BRAIN NATRIURETIC PEPTIDE - Abnormal; Notable for the following components:   B Natriuretic Peptide 195.0 (*)    All other components within normal limits  HEPATIC FUNCTION PANEL - Abnormal; Notable for the following components:   Albumin 3.4 (*)    AST 49 (*)    Total Bilirubin 2.8 (*)    Bilirubin, Direct 0.8 (*)    Indirect Bilirubin 2.0 (*)    All other components within normal limits  D-DIMER, QUANTITATIVE - Abnormal; Notable for the following components:   D-Dimer, Quant 15.88 (*)    All other components within normal limits  TROPONIN I (HIGH SENSITIVITY)  TROPONIN I (HIGH SENSITIVITY)    EKG None  Radiology DG Chest 2 View  Result Date: 10/21/2020 CLINICAL DATA:  70 year old female with increasing lower extremity swelling, numbness, shortness of breath and wheezing. Former  smoker. EXAM: CHEST - 2 VIEW COMPARISON:  Chest radiograph 12/23/2004. FINDINGS: Lung volumes and mediastinal contours remain normal. Visualized tracheal air column is within normal limits. No pneumothorax, pleural effusion, pulmonary edema or confluent pulmonary opacity. Mild diffuse interstitial markings have increased since 2006, probably smoking related. No acute osseous abnormality identified. Cholecystectomy clips in the upper abdomen. Negative visible bowel gas pattern. IMPRESSION: No acute cardiopulmonary abnormality. Electronically Signed   By: Genevie Ann M.D.   On: 10/21/2020 11:15   VAS [place] LOWER EXTREMITY VENOUS (DVT) (ONLY [facility] & [facility])  Result Date: 10/21/2020  Lower Venous DVT Study Patient Name:  Selena Johnson  Date of Exam:   10/21/2020 Medical Rec #: [id number]       Accession #:    [id number] Date of Birth: [birth year]        Patient Gender: F Patient Age:   70Y Exam Location:  [facility] Procedure:      VAS [place] LOWER EXTREMITY VENOUS (DVT) Referring Phys: [place] --------------------------------------------------------------------------------  Indications: Edema.  Limitations: Poor ultrasound/tissue interface. Comparison Study: no prior Performing Technologist: Abram Sander RVS  Examination Guidelines: A complete evaluation includes B-mode imaging, spectral Doppler, color Doppler, and power Doppler as needed of all accessible portions of each vessel. Bilateral testing is considered an integral part of a complete examination. Limited examinations for reoccurring indications may be performed as noted. The reflux portion of the exam is performed with the patient in reverse Trendelenburg.  +---------+---------------+---------+-----------+----------+--------------+ RIGHT    CompressibilityPhasicitySpontaneityPropertiesThrombus Aging +---------+---------------+---------+-----------+----------+--------------+ CFV      Full           Yes      Yes                                  +---------+---------------+---------+-----------+----------+--------------+ SFJ      Full                                                        +---------+---------------+---------+-----------+----------+--------------+  FV Prox  Full                                                        +---------+---------------+---------+-----------+----------+--------------+ FV Mid                  Yes      Yes                                 +---------+---------------+---------+-----------+----------+--------------+ FV Distal               Yes      Yes                                 +---------+---------------+---------+-----------+----------+--------------+ PFV      Full                                                        +---------+---------------+---------+-----------+----------+--------------+ POP      Full           Yes      Yes                                 +---------+---------------+---------+-----------+----------+--------------+ PTV      Full                                                        +---------+---------------+---------+-----------+----------+--------------+ PERO     Full                                                        +---------+---------------+---------+-----------+----------+--------------+   +---------+---------------+---------+-----------+----------+--------------+ LEFT     CompressibilityPhasicitySpontaneityPropertiesThrombus Aging +---------+---------------+---------+-----------+----------+--------------+ CFV      Full           Yes      Yes                                 +---------+---------------+---------+-----------+----------+--------------+ SFJ      Full                                                        +---------+---------------+---------+-----------+----------+--------------+ FV Prox  Full                                                         +---------+---------------+---------+-----------+----------+--------------+  FV Mid                  Yes      Yes                                 +---------+---------------+---------+-----------+----------+--------------+ FV Distal               Yes      Yes                                 +---------+---------------+---------+-----------+----------+--------------+ PFV      Full                                                        +---------+---------------+---------+-----------+----------+--------------+ POP      Full           Yes      Yes                                 +---------+---------------+---------+-----------+----------+--------------+ PTV      Full                                                        +---------+---------------+---------+-----------+----------+--------------+ PERO     Full                                                        +---------+---------------+---------+-----------+----------+--------------+    Summary: BILATERAL: - No evidence of deep vein thrombosis seen in the lower extremities, bilaterally. -No evidence of popliteal cyst, bilaterally.   *See table(s) above for measurements and observations.    Preliminary     Procedures Procedures   Medications Ordered in ED Medications  potassium chloride SA (KLOR-CON) CR tablet 40 mEq (40 mEq Oral Given 10/21/20 1459)    ED Course  I have reviewed the triage vital signs and the nursing notes.  Pertinent labs & imaging results that were available during my care of the patient were reviewed by me and considered in my medical decision making (see chart for details).  Clinical Course as of 10/21/20 1609  Wed Oct 21, 2020  1252 Brain natriuretic peptide [PA]  1304 DG Chest 2 View [PA]  1304 Troponin I (High Sensitivity) [PA]  1354 D-dimer, quantitative [PA]  5462 Brain natriuretic peptide [PA]  1355 Hepatic function panel [PA]  1355 D-dimer, quantitative [PA]  1355 Brain  natriuretic peptide [PA]  1355 Troponin I (High Sensitivity) [PA]    Clinical Course User Index [PA] Lacinda Axon, MD   MDM Rules/Calculators/A&P                          70 year old woman with a PMH of CKD, hepatic cirrhosis, hypertension, hypothyroidism, breast cancer and GERD who presents for evaluation  of progressive lower extremity edema with associated shortness of breath and cough. On exam, patient was found to have 2+ bilateral lower extremity edema, mild bibasilar crackles and moderately distended abdomen. Patient found to have normal troponin x2. BNP elevated to 195. D-dimer elevated to 15.88.  BMP significant for hypokalemia 3.3. CBC unremarkable. EKG shows normal sinus rhythm with borderline prolonged QT. Lower extremity vascular ultrasound negative for DVT. Chest x-ray does not show any pneumothorax, pleural effusion or pulmonary edema.  Patient's presentation likely secondary to acute heart failure versus worsening cirrhosis. Can also consider PE in the setting of her breast cancer history. Patient will need admission for further evaluation and management of her symptoms.  Patient accepted for admission by hospitalist.  Final Clinical Impression(s) / ED Diagnoses Final diagnoses:  Acute heart failure, unspecified heart failure type [facility])    Rx / DC Orders ED Discharge Orders    None       Lacinda Axon, MD 10/21/20 1719    Quintella Reichert, MD 10/22/20 [phone]

## 2020-10-21 NOTE — Telephone Encounter (Signed)
[facility] - Client TELEPHONE ADVICE RECORD Access[facility]urse Patient Name: Selena Johnson Gender: Female DOB: 11-05-1950 Age: 70 Y 16 D Return Phone Number: [phone] (Primary), [phone] (Secondary) Address[place] [address] Client [facility] - Client Client Site [place], Roque Lias - MD Contact Type Call Who Is Calling Patient / Member / Family / Caregiver Call Type Triage / Clinical Relationship To Patient Self Return Phone Number [phone] (Primary) Chief Complaint NUMBNESS/TINGLING- sudden on one side of the body or face Reason for Call Symptomatic / Request for Health Information Initial Comment Caller states, leg have been swollen and leaking. Left has been leaking 3-4 days, and numb to the touch. Not sure if due to mediation change. Office needs [place] to triage. Translation No Nurse Assessment Nurse: Rock Nephew, RN, Megan Date/Time (Eastern Time): 10/21/2020 8:54:37 AM Confirm and document reason for call. If symptomatic, describe symptoms. ---Caller states, leg have been swollen and leaking. Left has been leaking 3-4 days, and numb to the touch. Not sure if due to mediation change. Office needs [place] to triage. Does the patient have any new or worsening symptoms? ---Yes Will a triage be completed? ---Yes Related visit to physician within the last 2 weeks? ---Yes Does the PT have any chronic conditions? (i.e. diabetes, asthma, this includes High risk factors for pregnancy, etc.) ---Yes Is this a behavioral health or substance abuse call? ---No Guidelines Guideline Title Affirmed Question Affirmed Notes Nurse Date/Time (Eastern Time) Neurologic Deficit [1] Numbness (i.e., loss of sensation) of the face, arm / hand, or leg / foot on one side of the body AND [2] sudden onset AND [3] brief (now gone) Rock Nephew, RN, [facility] 10/21/2020  8:56:22 AM PLEASE NOTE: All timestamps contained within this report are represented as [place] Standard Time. CONFIDENTIALTY NOTICE: This fax transmission is intended only for the addressee. It contains information that is legally privileged, confidential or otherwise protected from use or disclosure. If you are not the intended recipient, you are strictly prohibited from reviewing, disclosing, copying using or disseminating any of this information or taking any action in reliance on or regarding this information. If you have received this fax in error, please notify us immediately by telephone so that we can arrange for its return to [place]. Phone: [phone], Toll-Free: [phone], Fax: [phone] Page: 2 of 2 Call Id: [id number] [place]. Time Eilene Ghazi Time) Disposition Final User 10/21/2020 8:53:40 AM Send to Urgent Laneta Simmers 10/21/2020 8:57:49 AM Go to ED Now (or PCP triage) Yes Rock Nephew, RN, Megan Caller Disagree/Comply Comply Caller Understands Yes PreDisposition Did not know what to do Care Advice Given Per Guideline GO TO ED NOW (OR PCP TRIAGE): * IF NO PCP (PRIMARY CARE PROVIDER) SECOND-LEVEL TRIAGE: You need to be seen within the next hour. Go to the [facility] at _____________ [place] as soon as you can. * IF PCP SECONDLEVEL TRIAGE REQUIRED: You may need to be seen. Your doctor (or NP/PA) will want to talk with you to decide what's best. I'll page the provider on-call now. If you haven't heard from the provider (or me) within 30 minutes, go directly to the [facility] at _[facility]. * It is better and safer if another adult drives instead of you. CARE ADVICE given per Neurologic Deficit (Adult) guideline. Referrals REFERRED TO PCP OFFICE

## 2020-10-21 NOTE — Progress Notes (Signed)
Lower extremity venous has been completed.   Preliminary results in CV Proc.   Abram Sander 10/21/2020 3:57 PM

## 2020-10-21 NOTE — Telephone Encounter (Signed)
Per chart review tab pt is at [facility] Ed. Per this phone note Dr Glori Bickers is aware.

## 2020-10-22 ENCOUNTER — Inpatient Hospital Stay (HOSPITAL_COMMUNITY): Payer: Medicare Other

## 2020-10-22 ENCOUNTER — Other Ambulatory Visit: Payer: Self-pay

## 2020-10-22 DIAGNOSIS — I509 Heart failure, unspecified: Secondary | ICD-10-CM | POA: Diagnosis not present

## 2020-10-22 DIAGNOSIS — E785 Hyperlipidemia, unspecified: Secondary | ICD-10-CM

## 2020-10-22 DIAGNOSIS — R0609 Other forms of dyspnea: Secondary | ICD-10-CM

## 2020-10-22 DIAGNOSIS — I10 Essential (primary) hypertension: Secondary | ICD-10-CM | POA: Diagnosis not present

## 2020-10-22 DIAGNOSIS — E8770 Fluid overload, unspecified: Principal | ICD-10-CM

## 2020-10-22 DIAGNOSIS — K7460 Unspecified cirrhosis of liver: Secondary | ICD-10-CM

## 2020-10-22 LAB — COMPREHENSIVE METABOLIC PANEL
ALT: 20 U/L (ref 0–44)
AST: 42 U/L — ABNORMAL HIGH (ref 15–41)
Albumin: 2.9 g/dL — ABNORMAL LOW (ref 3.5–5.0)
Alkaline Phosphatase: 87 U/L (ref 38–126)
Anion gap: 8 (ref 5–15)
BUN: <5 mg/dL — ABNORMAL LOW (ref 8–23)
CO2: 27 mmol/L (ref 22–32)
Calcium: 8.8 mg/dL — ABNORMAL LOW (ref 8.9–10.3)
Chloride: 104 mmol/L (ref 98–111)
Creatinine, Ser: 0.54 mg/dL (ref 0.44–1.00)
GFR, Estimated: >60 mL/min (ref >60)
Glucose, Bld: 85 mg/dL (ref 70–99)
Potassium: 3.1 mmol/L — ABNORMAL LOW (ref 3.5–5.1)
Sodium: 139 mmol/L (ref 135–145)
Total Bilirubin: 2.3 mg/dL — ABNORMAL HIGH (ref 0.3–1.2)
Total Protein: 5.5 g/dL — ABNORMAL LOW (ref 6.5–8.1)

## 2020-10-22 LAB — ECHOCARDIOGRAM COMPLETE
AR max vel: 2.07 cm2
AV Area VTI: 1.98 cm2
AV Area mean vel: 2.06 cm2
AV Mean grad: 7 mmHg
AV Peak grad: 13.1 mmHg
Ao pk vel: 1.81 m/s
Area-P 1/2: 3.91 cm2
Height: 65 in
MV VTI: 3.01 cm2
S' Lateral: 3.3 cm
Weight: 3403.2 oz

## 2020-10-22 LAB — CBC
HCT: 33.2 % — ABNORMAL LOW (ref 36.0–46.0)
Hemoglobin: 11.6 g/dL — ABNORMAL LOW (ref 12.0–15.0)
MCH: 36 pg — ABNORMAL HIGH (ref 26.0–34.0)
MCHC: 34.9 g/dL (ref 30.0–36.0)
MCV: 103.1 fL — ABNORMAL HIGH (ref 80.0–100.0)
Platelets: 82 10*3/uL — ABNORMAL LOW (ref 150–400)
RBC: 3.22 MIL/uL — ABNORMAL LOW (ref 3.87–5.11)
RDW: 13.5 % (ref 11.5–15.5)
WBC: 3.9 10*3/uL — ABNORMAL LOW (ref 4.0–10.5)
nRBC: 0 % (ref 0.0–0.2)

## 2020-10-22 MED ORDER — SPIRONOLACTONE 25 MG PO TABS
25.0000 mg | ORAL_TABLET | Freq: Every day | ORAL | Status: DC
  Administered 2020-10-22 – 2020-10-23 (×2): 25 mg via ORAL
  Filled 2020-10-22 (×2): qty 1

## 2020-10-22 MED ORDER — FUROSEMIDE 40 MG PO TABS
40.0000 mg | ORAL_TABLET | Freq: Every day | ORAL | Status: DC
  Administered 2020-10-23: 40 mg via ORAL
  Filled 2020-10-22: qty 1

## 2020-10-22 MED ORDER — PERFLUTREN LIPID MICROSPHERE
1.0000 mL | INTRAVENOUS | Status: AC | PRN
  Administered 2020-10-22: 3 mL via INTRAVENOUS
  Filled 2020-10-22: qty 10

## 2020-10-22 MED ORDER — POTASSIUM CHLORIDE CRYS ER 20 MEQ PO TBCR
40.0000 meq | EXTENDED_RELEASE_TABLET | Freq: Two times a day (BID) | ORAL | Status: DC
  Administered 2020-10-22: 40 meq via ORAL
  Filled 2020-10-22: qty 2

## 2020-10-22 NOTE — Evaluation (Addendum)
Occupational Therapy Evaluation Patient Details Name: Selena Johnson MRN: [id number] DOB: [birth year] Today's Date: 10/22/2020    History of Present Illness Selena Johnson is a 70 y.o. female with medical history significant of HTN, breast cancer, hypothyroid, cirrhosis who presents with worsening LE edema. Pt reports having thyroid regimen changed abruptly several months ago; pt reports gradually worsening LE edema that seems to improve with LE elevation and is improved first thing in the morning, worse at the end of the day after being on her feet. Pt reports feeling more "bloated" than usual with subjective wt gain noted. Denies chest pain. Has been taking diuretics, but repots no significant improvement.   Clinical Impression   Pt is at Ind baseline level of function with ADLs and ADL mobility/transfers. Pt sitting EOB upon arrival, no impair,ments in balance observed during ambulation to bathroom, into shower and around her room. Pt educated on edema mgt to keep LEs elevated when at rest; pt verbalizes understanding. No further acute OT services indicated at this time, OT will sign off    Follow Up Recommendations  No OT follow up    Equipment Recommendations  None recommended by OT    Recommendations for Other Services       Precautions / Restrictions Precautions Precautions: None Restrictions Weight Bearing Restrictions: No      Mobility Bed Mobility               General bed mobility comments: pt sitting EOB upon arrival    Transfers Overall transfer level: Independent                    Balance Overall balance assessment: No apparent balance deficits (not formally assessed)                                         ADL either performed or assessed with clinical judgement   ADL Overall ADL's : Independent;At baseline                                             Vision Baseline Vision/History: Wears glasses Wears  Glasses: Reading only Patient Visual Report: No change from baseline       Perception     Praxis      Pertinent Vitals/Pain Pain Assessment: No/denies pain     Hand Dominance Right   Extremity/Trunk Assessment Upper Extremity Assessment Upper Extremity Assessment: Overall WFL for tasks assessed   Lower Extremity Assessment Lower Extremity Assessment: Defer to PT evaluation   Cervical / Trunk Assessment Cervical / Trunk Assessment: Normal   Communication Communication Communication: No difficulties   Cognition Arousal/Alertness: Awake/alert Behavior During Therapy: WFL for tasks assessed/performed Overall Cognitive Status: Within Functional Limits for tasks assessed                                     General Comments       Exercises     Shoulder Instructions      Home Living Family/patient expects to be discharged to:: Private residence Living Arrangements: Spouse/significant other Available Help at Discharge: Family Type of Home: House Home Access: Stairs to enter [employer] of Steps: 3 Entrance Stairs-Rails: Right;Left;Can  reach both Home Layout: One level     Bathroom Shower/Tub: Tub/shower unit;Walk-in shower   Bathroom Toilet: Handicapped height     Home Equipment: None          Prior Functioning/Environment Level of Independence: Independent                 OT Problem List: Decreased activity tolerance      OT Treatment/Interventions:      OT Goals(Current goals can be found in the care plan section) Acute Rehab OT Goals Patient Stated Goal: go home today OT Goal Formulation: With patient  OT Frequency:     Barriers to D/C:            Co-evaluation              AM-PAC OT "6 Clicks" Daily Activity     Outcome Measure Help from another person eating meals?: None Help from another person taking care of personal grooming?: None Help from another person toileting, which includes using toliet,  bedpan, or urinal?: None Help from another person bathing (including washing, rinsing, drying)?: None Help from another person to put on and taking off regular upper body clothing?: None Help from another person to put on and taking off regular lower body clothing?: None 6 Click Score: 24   End of Session    Activity Tolerance: Patient tolerated treatment well Patient left: in chair  OT Visit Diagnosis: Other (comment) (LE edema)                Time: 3567-0141 OT Time Calculation (min): 20 min Charges:  OT General Charges $OT Visit: 1 Visit OT Evaluation $OT Eval Low Complexity: 1 Low    Britt Bottom 10/22/2020, 11:29 AM

## 2020-10-22 NOTE — Progress Notes (Signed)
  Echocardiogram 2D Echocardiogram has been performed with [facility].  Selena Johnson 10/22/2020, 2:13 PM

## 2020-10-22 NOTE — Consult Note (Signed)
Cardiology Consultation:   Patient ID: [name] MRN: [id number]; DOB: 07-17-1950  Admit date: 10/21/2020 Date of Consult: 10/22/2020  PCP:  Abner Greenspan, MD   [facility] Providers Cardiologist:  New - Dr. Oval Linsey     Patient Profile:   Selena Johnson is a 70 y.o. female with a hx of hypertension, breast cancer s/p radiation therapy 2020, hypothyroidism, and NASH cirrhosis who is being seen 10/22/2020 for the evaluation of leg edema at the request of Dr. Karleen Hampshire.  History of Present Illness:   Selena Johnson is a 70 year old female with past medical history of hypertension, breast cancer s/p radiation therapy 2020, hypothyroidism, and NASH cirrhosis who presented with worsening bilateral lower extremity edema.  She does not have any prior cardiac history.  About 1 month ago, her thyroid regimen was increased from 175 mcg daily to 200 mcg daily.  Since medication adjustment, her TSH has came down from 13.64 to 1.95. Since then, she has been noticing increasing bilateral lower extremity swelling.  She also complains of intermittent "gas pain".  Patient denies any worsening dyspnea on exertion, orthopnea or PND.  She has noticed some weeping drainage in the lower extremity.  She says initially, the left leg was swollen, then later the right leg as well.  She has been on triamterene-hydrochlorothiazide at home.  Patient says her leg edema worsens near the end of the day and looks better early in the morning after overnight sleep.  She eventually sought medical attention at [facility] on 10/21/2020.  On exam, she had at least 2+ pitting edema.  She also complained of abdominal distention as well.  Serial troponin was negative.  BNP 195.  D-dimer 15.88.  Lower extremity venous Doppler negative.  Abdominal ultrasound revealed diffusely decreased echogenicity of the liver parenchyma, right upper quadrant ascites.  Patient was admitted to hospitalist service and started on IV 40 mg daily of  Lasix.  Cardiology has been consulted for possible heart failure.   Past Medical History:  Diagnosis Date  . Allergy   . Cancer ([place])    basal cell skin CA  . Chronic kidney disease    hx of kidney stones  . Conjunctivitis    chronic  . Fatty liver 2015  . GERD (gastroesophageal reflux disease)   . Glaucoma   . Hearing loss in left ear   . History of kidney stones   . Hyperlipidemia    no per pt  . Hypertension   . Menopausal syndrome   . NASH (nonalcoholic steatohepatitis)    with cirrhosis and mild esoph varicies  . Primary hypothyroidism     Past Surgical History:  Procedure Laterality Date  . ABDOMINAL HYSTERECTOMY     partial ? prolapse  . BREAST LUMPECTOMY WITH RADIOACTIVE SEED AND SENTINEL LYMPH NODE BIOPSY Left 07/12/2018   Procedure: LEFT BREAST LUMPECTOMY WITH RADIOACTIVE SEED AND LEFT AXILLARY SENTINEL LYMPH NODE BIOPSY;  Surgeon: Rolm Bookbinder, MD;  Location: [facility];  Service: General;  Laterality: Left;  . CHOLECYSTECTOMY    . ESOPHAGOGASTRODUODENOSCOPY    . TONSILLECTOMY       Home Medications:  Prior to Admission medications   Medication Sig Start Date End Date Taking? Authorizing Provider  anastrozole (ARIMIDEX) 1 MG tablet Take 1 tablet by mouth daily Patient taking differently: Take 1 mg by mouth daily. 09/08/20  Yes Magrinat, Virgie Dad, MD  Biotin 2500 MCG CAPS Take 5,000 mcg by mouth daily.   Yes [provider]  Cholecalciferol (VITAMIN D3) 2000 units TABS Take 2,000 Units by mouth daily.   Yes [provider]  levothyroxine (SYNTHROID) 200 MCG tablet Take 1 tablet (200 mcg total) by mouth daily before breakfast. 10/08/20  Yes Tower, [name], MD  loratadine (CLARITIN) 10 MG tablet Take 10 mg by mouth daily as needed.   Yes [provider]  metoprolol succinate (TOPROL-XL) 100 MG 24 hr tablet Take 1 by mouth daily with or after a meal Patient taking differently: Take 100 mg by mouth daily. 09/07/20  Yes Tower, [name], MD   Multiple Vitamin (MULTIVITAMIN) capsule Take 1 capsule by mouth daily.   Yes [provider]  potassium chloride SA (KLOR-CON) 20 MEQ tablet Take 1 tablet by mouth every day Patient taking differently: Take 20 mEq by mouth daily. 09/07/20  Yes Tower, [name], MD  triamterene-hydrochlorothiazide (MAXZIDE-25) 37.5-25 MG tablet Take 1 tablet by mouth daily. 09/07/20  Yes Tower, [name], MD    Inpatient Medications: Scheduled Meds: . anastrozole  1 mg Oral Daily  . cholecalciferol  2,000 Units Oral Daily  . enoxaparin (LOVENOX) injection  40 mg Subcutaneous Q24H  . furosemide  40 mg Intravenous Daily  . levothyroxine  200 mcg Oral QAC breakfast  . metoprolol succinate  100 mg Oral Daily  . potassium chloride  40 mEq Oral BID   Continuous Infusions:  PRN Meds:   Allergies:    Allergies  Allergen Reactions  . Codeine     REACTION: Throat swelling  . Ivp Dye [Iodinated Diagnostic Agents] Swelling    Social History:   Social History   Socioeconomic History  . Marital status: Married    Spouse name: Dwaine Deter. [place]  . Number of children: Not on file  . Years of education: Not on file  . Highest education level: Not on file  Occupational History  . Not on file  Tobacco Use  . Smoking status: Former Smoker    Quit date: 06/06/1982    Years since quitting: 38.4  . Smokeless tobacco: Never Used  Vaping Use  . Vaping Use: Never used  Substance and Sexual Activity  . Alcohol use: Yes    Alcohol/week: 0.0 standard drinks    Comment: wine- rare  . Drug use: No  . Sexual activity: Not on file  Other Topics Concern  . Not on file  Social History Narrative  . Not on file   Social Determinants of Health   Financial Resource Strain: Low Risk   . Difficulty of Paying Living Expenses: Not hard at all  Food Insecurity: No Food Insecurity  . Worried About Charity fundraiser in the Last Year: Never true  . Ran Out of Food in the Last Year: Never true  Transportation Needs:  No Transportation Needs  . Lack of Transportation (Medical): No  . Lack of Transportation (Non-Medical): No  Physical Activity: Inactive  . Days of Exercise per Week: 0 days  . Minutes of Exercise per Session: 0 min  Stress: Stress Concern Present  . Feeling of Stress : Rather much  Social Connections: Not on file  Intimate Partner Violence: Not At Risk  . Fear of Current or Ex-Partner: No  . Emotionally Abused: No  . Physically Abused: No  . Sexually Abused: No    Family History:    Family History  Problem Relation Age of Onset  . Heart disease Mother        CAD  . Hypertension Mother   .  Glaucoma Mother   . Heart disease Father        CAD  . Diabetes Father   . Hypertension Father   . Cancer Father        ? CA - squamous cell carcinoma  . Colon cancer Neg Hx   . Esophageal cancer Neg Hx   . Rectal cancer Neg Hx   . Stomach cancer Neg Hx      ROS:  Please see the history of present illness.   All other ROS reviewed and negative.     Physical Exam/Data:   Vitals:   10/22/20 0423 10/22/20 0727 10/22/20 0800 10/22/20 1149  BP: 140/74 (!) 128/57  (!) 141/74  Pulse: 83 72  77  Resp: 15  18   Temp: 98.3 F (36.8 C) 98.8 F (37.1 C)    TempSrc: Oral Oral    SpO2: 94% 97%  94%  Weight: 96.5 kg     Height:        Intake/Output Summary (Last 24 hours) at 10/22/2020 1309 Last data filed at 10/22/2020 1245 Gross per 24 hour  Intake 720 ml  Output 2800 ml  Net -2080 ml   Last 3 Weights 10/22/2020 10/21/2020 10/21/2020  Weight (lbs) 212 lb 11.2 oz 216 lb 11.4 oz 215 lb  Weight (kg) 96.48 kg 98.3 kg 97.523 kg     Body mass index is 35.4 kg/m.  General:  Well nourished, well developed, in no acute distress HEENT: normal Lymph: no adenopathy Neck: no JVD Endocrine:  No thryomegaly Vascular: No carotid bruits; FA pulses 2+ bilaterally without bruits  Cardiac:  normal S1, S2; RRR; no murmur  Lungs:  clear to auscultation bilaterally, no wheezing, rhonchi or rales   Abd: soft, nontender, no hepatomegaly  Ext: 2+ edema Musculoskeletal:  No deformities, BUE and BLE strength normal and equal Skin: warm and dry  Neuro:  CNs 2-12 intact, no focal abnormalities noted Psych:  Normal affect   EKG:  The EKG was personally reviewed and demonstrates: Normal sinus rhythm without significant ST-T wave changes Telemetry:  Telemetry was personally reviewed and demonstrates: Normal sinus rhythm, no significant ventricular ectopy  Relevant CV Studies:  N/A  Laboratory Data:  High Sensitivity Troponin:   Recent Labs  Lab 10/21/20 1019 10/21/20 1314 10/21/20 1842 10/21/20 1942  TROPONINIHS 4 4 5 4      Chemistry Recent Labs  Lab 10/21/20 1019 10/21/20 1842 10/22/20 0307  NA 138  --  139  K 3.3*  --  3.1*  CL 103  --  104  CO2 25  --  27  GLUCOSE 108*  --  85  BUN 6*  --  <5*  CREATININE 0.59 0.59 0.54  CALCIUM 9.1  --  8.8*  GFRNONAA >60 >60 >60  ANIONGAP 10  --  8    Recent Labs  Lab 10/21/20 1314 10/22/20 0307  PROT 6.7 5.5*  ALBUMIN 3.4* 2.9*  AST 49* 42*  ALT 22 20  ALKPHOS 105 87  BILITOT 2.8* 2.3*   Hematology Recent Labs  Lab 10/21/20 1019 10/21/20 1842 10/22/20 0307  WBC 4.4 5.8 3.9*  RBC 3.95 3.93 3.22*  HGB 13.8 14.0 11.6*  HCT 41.1 40.6 33.2*  MCV 104.1* 103.3* 103.1*  MCH 34.9* 35.6* 36.0*  MCHC 33.6 34.5 34.9  RDW 13.3 13.2 13.5  PLT 96* 106* 82*   BNP Recent Labs  Lab 10/21/20 1130  BNP 195.0*    DDimer  Recent Labs  Lab 10/21/20 1314  DDIMER 15.88*     Radiology/Studies:  DG Chest 2 View  Result Date: 10/21/2020 CLINICAL DATA:  70 year old female with increasing lower extremity swelling, numbness, shortness of breath and wheezing. Former smoker. EXAM: CHEST - 2 VIEW COMPARISON:  Chest radiograph 12/23/2004. FINDINGS: Lung volumes and mediastinal contours remain normal. Visualized tracheal air column is within normal limits. No pneumothorax, pleural effusion, pulmonary edema or confluent pulmonary  opacity. Mild diffuse interstitial markings have increased since 2006, probably smoking related. No acute osseous abnormality identified. Cholecystectomy clips in the upper abdomen. Negative visible bowel gas pattern. IMPRESSION: No acute cardiopulmonary abnormality. Electronically Signed   By: Genevie Ann M.D.   On: 10/21/2020 11:15   VAS [place] LOWER EXTREMITY VENOUS (DVT) (ONLY [facility] & [facility])  Result Date: 10/21/2020  Lower Venous DVT Study Patient Name:  KIERNAN FARKAS  Date of Exam:   10/21/2020 Medical Rec #: [id number]       Accession #:    [id number] Date of Birth: 1951-04-13        Patient Gender: F Patient Age:   70Y Exam Location:  [facility] Procedure:      VAS [place] LOWER EXTREMITY VENOUS (DVT) Referring Phys: [place] --------------------------------------------------------------------------------  Indications: Edema.  Limitations: Poor ultrasound/tissue interface. Comparison Study: no prior Performing Technologist: Abram Sander RVS  Examination Guidelines: A complete evaluation includes B-mode imaging, spectral Doppler, color Doppler, and power Doppler as needed of all accessible portions of each vessel. Bilateral testing is considered an integral part of a complete examination. Limited examinations for reoccurring indications may be performed as noted. The reflux portion of the exam is performed with the patient in reverse Trendelenburg.  +---------+---------------+---------+-----------+----------+--------------+ RIGHT    CompressibilityPhasicitySpontaneityPropertiesThrombus Aging +---------+---------------+---------+-----------+----------+--------------+ CFV      Full           Yes      Yes                                 +---------+---------------+---------+-----------+----------+--------------+ SFJ      Full                                                        +---------+---------------+---------+-----------+----------+--------------+ FV Prox  Full                                                         +---------+---------------+---------+-----------+----------+--------------+ FV Mid                  Yes      Yes                                 +---------+---------------+---------+-----------+----------+--------------+ FV Distal               Yes      Yes                                 +---------+---------------+---------+-----------+----------+--------------+ PFV      Full                                                        +---------+---------------+---------+-----------+----------+--------------+  POP      Full           Yes      Yes                                 +---------+---------------+---------+-----------+----------+--------------+ PTV      Full                                                        +---------+---------------+---------+-----------+----------+--------------+ PERO     Full                                                        +---------+---------------+---------+-----------+----------+--------------+   +---------+---------------+---------+-----------+----------+--------------+ LEFT     CompressibilityPhasicitySpontaneityPropertiesThrombus Aging +---------+---------------+---------+-----------+----------+--------------+ CFV      Full           Yes      Yes                                 +---------+---------------+---------+-----------+----------+--------------+ SFJ      Full                                                        +---------+---------------+---------+-----------+----------+--------------+ FV Prox  Full                                                        +---------+---------------+---------+-----------+----------+--------------+ FV Mid                  Yes      Yes                                 +---------+---------------+---------+-----------+----------+--------------+ FV Distal               Yes      Yes                                  +---------+---------------+---------+-----------+----------+--------------+ PFV      Full                                                        +---------+---------------+---------+-----------+----------+--------------+ POP      Full           Yes      Yes                                 +---------+---------------+---------+-----------+----------+--------------+  PTV      Full                                                        +---------+---------------+---------+-----------+----------+--------------+ PERO     Full                                                        +---------+---------------+---------+-----------+----------+--------------+     Summary: BILATERAL: - No evidence of deep vein thrombosis seen in the lower extremities, bilaterally. -No evidence of popliteal cyst, bilaterally.   *See table(s) above for measurements and observations. Electronically signed by Curt Jews MD on 10/21/2020 at 8:33:32 PM.    Final    US Abdomen Limited RUQ (LIVER/GB)  Result Date: 10/21/2020 CLINICAL DATA:  Cirrhosis.  History of prior cholecystectomy. EXAM: ULTRASOUND ABDOMEN LIMITED RIGHT UPPER QUADRANT COMPARISON:  None. FINDINGS: Gallbladder: The gallbladder is surgically absent. Common bile duct: Diameter: 8.4 mm Liver: No focal lesion identified. Diffusely decreased echogenicity of the liver parenchyma is noted. Portal vein is patent on color Doppler imaging with normal direction of blood flow towards the liver. Other: Right upper quadrant ascites is present. IMPRESSION: 1. Evidence of prior cholecystectomy. 2. Right upper quadrant ascites. Electronically Signed   By: Virgina Norfolk M.D.   On: 10/21/2020 22:08     Assessment and Plan:   1. Bilateral leg edema: Unclear if improved present heart failure.  Pending echocardiogram to assess ejection fraction.  I suspect she has some degree of venous insufficiency.  Patient describes worsening leg edema throughout the day with  standing or sitting, leg edema looks better in the morning.  -Continue IV Lasix while monitoring renal function.  Follow-up on echocardiogram, if ejection fraction is normal and no significant diastolic dysfunction, then her leg edema may not be related to cardiac issue.  2. Ascites: Patient has a history of liver cirrhosis.   3. Hypothyroidism: Levothyroxine recently increased from 170 to 200 mcg/day.  4. History of breast cancer: S/p radiation therapy in 2020   Risk Assessment/Risk Scores:        New York Heart Association (NYHA) Functional Class NYHA Class II        For questions or updates, please contact [facility] Please consult [email or web address] for contact info under    Hilbert Corrigan, [place]  10/22/2020 1:09 PM

## 2020-10-22 NOTE — Progress Notes (Signed)
PROGRESS NOTE    Selena Johnson  NOM:[id number] DOB: Apr 11, 1951 DOA: 10/21/2020 PCP: Abner Greenspan, MD    No chief complaint on file.   Brief Narrative:   Prior history of hypertension, breast cancer s/p radiation, hypothyroidism, [name] cirrhosis presents with bilateral lower extremity edema associated with some orthopnea, dyspnea on exertion.  Patient was admitted for evaluation of CHF.  She was started on IV Lasix and cardiology consulted for evaluation of CHF.   Assessment & Plan:   Principal Problem:   Volume overload Active Problems:   Hyperlipidemia   Essential hypertension   TRANSAMINASES, SERUM, ELEVATED   Hepatic cirrhosis (HCC)   Malignant neoplasm of upper-inner quadrant of left breast in female, estrogen receptor positive (HCC)   CHF exacerbation (HCC)   Sob on exertion, leg edema  Evaluate for CHF,  Elevated BNP Echocardiogram ordered and pending.  On IV lasix for diuresis.     Hypertension:  Well controlled.    Cirrhosis   Breast cancer  status postradiation therapy, Dex.   Hyperlipidemia    Hypothyroidism Continue Synthroid at 200 MCG daily.   Hypokalemia Replaced    DVT prophylaxis: lovenox.  Code Status: DNR Family Communication: NONE AT BEDSIDE.  Disposition:   Status is: Inpatient  Remains inpatient appropriate because:Ongoing diagnostic testing needed not appropriate for outpatient work up and IV treatments appropriate due to intensity of illness or inability to take PO   Dispo: The patient is from: Home              Anticipated d/c is to: Home              Patient currently is not medically stable to d/c.   Difficult to place patient No       Consultants:   CARDIOLOGY  Procedures:echo Antimicrobials: none.   Subjective: Improving leg edema, sob on walking.  No chest pain or nausea, vomiting or abdominal pain.   Objective: Vitals:   10/22/20 0109 10/22/20 0423 10/22/20 0727 10/22/20 0800  BP: (!) 121/57  140/74 (!) 128/57   Pulse: 73 83 72   Resp: 18 15  18   Temp: 98.4 F (36.9 C) 98.3 F (36.8 C) 98.8 F (37.1 C)   TempSrc: Oral Oral Oral   SpO2: 96% 94% 97%   Weight:  96.5 kg    Height:        Intake/Output Summary (Last 24 hours) at 10/22/2020 1008 Last data filed at 10/22/2020 0600 Gross per 24 hour  Intake 480 ml  Output 2000 ml  Net -1520 ml   Filed Weights   10/21/20 1500 10/21/20 1831 10/22/20 0423  Weight: 97.5 kg 98.3 kg 96.5 kg    Examination:  General exam: Appears calm and comfortable  Respiratory system: Clear to auscultation. Respiratory effort normal. Cardiovascular system: S1 & S2 heard, RRR. No JVD,  Gastrointestinal system: Abdomen is nondistended, soft and nontender.Normal bowel sounds heard. Central nervous system: Alert and oriented. No focal neurological deficits. Extremities: bilateral weeping leg edema.  Skin: No rashes, lesions or ulcers Psychiatry:  Mood & affect appropriate.     Data Reviewed: I have personally reviewed following labs and imaging studies  CBC: Recent Labs  Lab 10/21/20 1019 10/21/20 1842 10/22/20 0307  WBC 4.4 5.8 3.9*  HGB 13.8 14.0 11.6*  HCT 41.1 40.6 33.2*  MCV 104.1* 103.3* 103.1*  PLT 96* 106* 82*    Basic Metabolic Panel: Recent Labs  Lab 10/21/20 1019 10/21/20 1842 10/22/20 0307  NA 138  --  139  K 3.3*  --  3.1*  CL 103  --  104  CO2 25  --  27  GLUCOSE 108*  --  85  BUN 6*  --  <5*  CREATININE 0.59 0.59 0.54  CALCIUM 9.1  --  8.8*    GFR: Estimated Creatinine Clearance: 73.1 mL/min (by C-G formula based on SCr of 0.54 mg/dL).  Liver Function Tests: Recent Labs  Lab 10/21/20 1314 10/22/20 0307  AST 49* 42*  ALT 22 20  ALKPHOS 105 87  BILITOT 2.8* 2.3*  PROT 6.7 5.5*  ALBUMIN 3.4* 2.9*    CBG: No results for input(s): GLUCAP in the last 168 hours.   Recent Results (from the past 240 hour(s))  Resp Panel by RT-PCR (Flu A&B, Covid) Nasopharyngeal Swab     Status: None    Collection Time: 10/21/20  5:33 PM   Specimen: Nasopharyngeal Swab; Nasopharyngeal(NP) swabs in vial transport medium  Result Value Ref Range Status   SARS Coronavirus 2 by RT PCR NEGATIVE NEGATIVE Final    Comment: (NOTE) SARS-CoV-2 target nucleic acids are NOT DETECTED.  The SARS-CoV-2 RNA is generally detectable in upper respiratory specimens during the acute phase of infection. The lowest concentration of SARS-CoV-2 viral copies this assay can detect is 138 copies/mL. A negative result does not preclude SARS-Cov-2 infection and should not be used as the sole basis for treatment or other patient management decisions. A negative result may occur with  improper specimen collection/handling, submission of specimen other than nasopharyngeal swab, presence of viral mutation(s) within the areas targeted by this assay, and inadequate number of viral copies(<138 copies/mL). A negative result must be combined with clinical observations, patient history, and epidemiological information. The expected result is Negative.  Fact Sheet for Patients:  EntrepreneurPulse.com.au  Fact Sheet for Healthcare Providers:  IncredibleEmployment.be  This test is no t yet approved or cleared by the [place] FDA and  has been authorized for detection and/or diagnosis of SARS-CoV-2 by FDA under an Emergency Use Authorization (EUA). This EUA will remain  in effect (meaning this test can be used) for the duration of the COVID-19 declaration under Section 564(b)(1) of the Act, 21 U.S.C.section 360bbb-3(b)(1), unless the authorization is terminated  or revoked sooner.       Influenza A by PCR NEGATIVE NEGATIVE Final   Influenza B by PCR NEGATIVE NEGATIVE Final    Comment: (NOTE) The Xpert Xpress SARS-CoV-2/FLU/RSV plus assay is intended as an aid in the diagnosis of influenza from Nasopharyngeal swab specimens and should not be used as a sole basis for treatment.  Nasal washings and aspirates are unacceptable for Xpert Xpress SARS-CoV-2/FLU/RSV testing.  Fact Sheet for Patients: EntrepreneurPulse.com.au  Fact Sheet for Healthcare Providers: IncredibleEmployment.be  This test is not yet approved or cleared by the [place] FDA and has been authorized for detection and/or diagnosis of SARS-CoV-2 by FDA under an Emergency Use Authorization (EUA). This EUA will remain in effect (meaning this test can be used) for the duration of the COVID-19 declaration under Section 564(b)(1) of the Act, 21 U.S.C. section 360bbb-3(b)(1), unless the authorization is terminated or revoked.  Performed at [facility], [place] [address] [place], [place] [address]          Radiology Studies: DG Chest 2 View  Result Date: 10/21/2020 CLINICAL DATA:  70 year old female with increasing lower extremity swelling, numbness, shortness of breath and wheezing. Former smoker. EXAM: CHEST - 2 VIEW COMPARISON:  Chest radiograph 12/23/2004. FINDINGS: Lung  volumes and mediastinal contours remain normal. Visualized tracheal air column is within normal limits. No pneumothorax, pleural effusion, pulmonary edema or confluent pulmonary opacity. Mild diffuse interstitial markings have increased since 2006, probably smoking related. No acute osseous abnormality identified. Cholecystectomy clips in the upper abdomen. Negative visible bowel gas pattern. IMPRESSION: No acute cardiopulmonary abnormality. Electronically Signed   By: Genevie Ann M.D.   On: 10/21/2020 11:15   VAS [place] LOWER EXTREMITY VENOUS (DVT) (ONLY [facility] & [facility])  Result Date: 10/21/2020  Lower Venous DVT Study Patient Name:  CHYANN AMBROCIO  Date of Exam:   10/21/2020 Medical Rec #: [id number]       Accession #:    [id number] Date of Birth: [birth year]        Patient Gender: F Patient Age:   70Y Exam Location:  [facility] Procedure:      VAS [place] LOWER EXTREMITY VENOUS (DVT) Referring Phys:  [place] --------------------------------------------------------------------------------  Indications: Edema.  Limitations: Poor ultrasound/tissue interface. Comparison Study: no prior Performing Technologist: Abram Sander RVS  Examination Guidelines: A complete evaluation includes B-mode imaging, spectral Doppler, color Doppler, and power Doppler as needed of all accessible portions of each vessel. Bilateral testing is considered an integral part of a complete examination. Limited examinations for reoccurring indications may be performed as noted. The reflux portion of the exam is performed with the patient in reverse Trendelenburg.  +---------+---------------+---------+-----------+----------+--------------+ RIGHT    CompressibilityPhasicitySpontaneityPropertiesThrombus Aging +---------+---------------+---------+-----------+----------+--------------+ CFV      Full           Yes      Yes                                 +---------+---------------+---------+-----------+----------+--------------+ SFJ      Full                                                        +---------+---------------+---------+-----------+----------+--------------+ FV Prox  Full                                                        +---------+---------------+---------+-----------+----------+--------------+ FV Mid                  Yes      Yes                                 +---------+---------------+---------+-----------+----------+--------------+ FV Distal               Yes      Yes                                 +---------+---------------+---------+-----------+----------+--------------+ PFV      Full                                                        +---------+---------------+---------+-----------+----------+--------------+  POP      Full           Yes      Yes                                 +---------+---------------+---------+-----------+----------+--------------+  PTV      Full                                                        +---------+---------------+---------+-----------+----------+--------------+ PERO     Full                                                        +---------+---------------+---------+-----------+----------+--------------+   +---------+---------------+---------+-----------+----------+--------------+ LEFT     CompressibilityPhasicitySpontaneityPropertiesThrombus Aging +---------+---------------+---------+-----------+----------+--------------+ CFV      Full           Yes      Yes                                 +---------+---------------+---------+-----------+----------+--------------+ SFJ      Full                                                        +---------+---------------+---------+-----------+----------+--------------+ FV Prox  Full                                                        +---------+---------------+---------+-----------+----------+--------------+ FV Mid                  Yes      Yes                                 +---------+---------------+---------+-----------+----------+--------------+ FV Distal               Yes      Yes                                 +---------+---------------+---------+-----------+----------+--------------+ PFV      Full                                                        +---------+---------------+---------+-----------+----------+--------------+ POP      Full           Yes      Yes                                 +---------+---------------+---------+-----------+----------+--------------+  PTV      Full                                                        +---------+---------------+---------+-----------+----------+--------------+ PERO     Full                                                        +---------+---------------+---------+-----------+----------+--------------+     Summary: BILATERAL: - No evidence of deep  vein thrombosis seen in the lower extremities, bilaterally. -No evidence of popliteal cyst, bilaterally.   *See table(s) above for measurements and observations. Electronically signed by Curt Jews MD on 10/21/2020 at 8:33:32 PM.    Final    US Abdomen Limited RUQ (LIVER/GB)  Result Date: 10/21/2020 CLINICAL DATA:  Cirrhosis.  History of prior cholecystectomy. EXAM: ULTRASOUND ABDOMEN LIMITED RIGHT UPPER QUADRANT COMPARISON:  None. FINDINGS: Gallbladder: The gallbladder is surgically absent. Common bile duct: Diameter: 8.4 mm Liver: No focal lesion identified. Diffusely decreased echogenicity of the liver parenchyma is noted. Portal vein is patent on color Doppler imaging with normal direction of blood flow towards the liver. Other: Right upper quadrant ascites is present. IMPRESSION: 1. Evidence of prior cholecystectomy. 2. Right upper quadrant ascites. Electronically Signed   By: Virgina Norfolk M.D.   On: 10/21/2020 22:08        Scheduled Meds: . anastrozole  1 mg Oral Daily  . cholecalciferol  2,000 Units Oral Daily  . enoxaparin (LOVENOX) injection  40 mg Subcutaneous Q24H  . furosemide  40 mg Intravenous Daily  . levothyroxine  200 mcg Oral QAC breakfast  . metoprolol succinate  100 mg Oral Daily  . potassium chloride  40 mEq Oral BID   Continuous Infusions:   LOS: 1 day    Time spent: 36 minutes.     Hosie Poisson, MD [facility]   To contact the attending provider between 7A-7P or the covering provider during after hours 7P-7A, please log into the web site [email or web address] and access using universal [place] password for that web site. If you do not have the password, please call the hospital operator.  10/22/2020, 10:08 AM

## 2020-10-22 NOTE — Evaluation (Signed)
Physical Therapy Evaluation & Discharge Patient Details Name: Selena Johnson MRN: [id number] DOB: 06/25/1950 Today's Date: 10/22/2020   History of Present Illness  Pt is a 70 y.o. female admitted 10/21/20 with BLE edema, orthopnea; pt reports recent change in thyroid regimen. Workup for venous stasis vs new dx of acute CHF. PMH includes HTN, breast CA, hypothroidism, cirrhosis.    Clinical Impression  Patient evaluated by Physical Therapy with no further acute PT needs identified. PTA, pt independent, active, lives with husband; pt endorses a couple falls "that were just me being dumb." Today, pt independent with mobility; minimal DOE noted, SpO2 >/96% on RA. Dynamic Gait Index score of 21/24 does not indicate risk for falls with higher level balance activities. Educ re: fall risk reduction strategies, activity recommendations. All education has been completed and the patient has no further questions. Acute PT is signing off. Thank you for this referral.    Follow Up Recommendations No PT follow up    Equipment Recommendations  None recommended by PT    Recommendations for Other Services       Precautions / Restrictions Precautions Precautions: None Restrictions Weight Bearing Restrictions: No      Mobility  Bed Mobility               General bed mobility comments: received sitting in recliner    Transfers Overall transfer level: Independent Equipment used: None                Ambulation/Gait Ambulation/Gait assistance: Independent Gait Distance (Feet): 500 Feet Assistive device: None Gait Pattern/deviations: WFL(Within Functional Limits)   Gait velocity interpretation: >2.62 ft/sec, indicative of community ambulatory    Stairs            Wheelchair Mobility    Modified Rankin (Stroke Patients Only)       Balance Overall balance assessment: Independent                               Standardized Balance Assessment Standardized  Balance Assessment : Dynamic Gait Index   Dynamic Gait Index Level Surface: Normal Change in Gait Speed: Normal Gait with Horizontal Head Turns: Normal Gait with Vertical Head Turns: Mild Impairment Gait and Pivot Turn: Mild Impairment Step Over Obstacle: Mild Impairment Step Around Obstacles: Normal Steps: Normal Total Score: 21       Pertinent Vitals/Pain Pain Assessment: No/denies pain    Home Living Family/patient expects to be discharged to:: Private residence Living Arrangements: Spouse/significant other Available Help at Discharge: Family Type of Home: House Home Access: Stairs to enter Entrance Stairs-Rails: Right;Left;Can reach both Technical brewer of Steps: 3 Home Layout: One level Home Equipment: None      Prior Function Level of Independence: Independent         Comments: Enjoys working outside; endorses a few falls "that were all my own fault." Helps husband     Hand Dominance   Dominant Hand: Right    Extremity/Trunk Assessment   Upper Extremity Assessment Upper Extremity Assessment: Overall WFL for tasks assessed    Lower Extremity Assessment Lower Extremity Assessment: Overall WFL for tasks assessed (bilateral lower leg/foot swelling)    Cervical / Trunk Assessment Cervical / Trunk Assessment: Normal  Communication   Communication: No difficulties  Cognition Arousal/Alertness: Awake/alert Behavior During Therapy: WFL for tasks assessed/performed Overall Cognitive Status: Within Functional Limits for tasks assessed  General Comments General comments (skin integrity, edema, etc.): Reviewed educ re: fall risk reduction strategies, activity recommendations    Exercises     Assessment/Plan    PT Assessment Patent does not need any further PT services  PT Problem List         PT Treatment Interventions      PT Goals (Current goals can be found in the Care Plan section)   Acute Rehab PT Goals Patient Stated Goal: go home today PT Goal Formulation: All assessment and education complete, DC therapy    Frequency     Barriers to discharge        Co-evaluation               AM-PAC PT "6 Clicks" Mobility  Outcome Measure Help needed turning from your back to your side while in a flat bed without using bedrails?: None Help needed moving from lying on your back to sitting on the side of a flat bed without using bedrails?: None Help needed moving to and from a bed to a chair (including a wheelchair)?: None Help needed standing up from a chair using your arms (e.g., wheelchair or bedside chair)?: None Help needed to walk in hospital room?: None Help needed climbing 3-5 steps with a railing? : None 6 Click Score: 24    End of Session   Activity Tolerance: Patient tolerated treatment well Patient left: in chair;with call bell/phone within reach Nurse Communication: Mobility status PT Visit Diagnosis: Other abnormalities of gait and mobility (R26.89)    Time: 1848-5927 PT Time Calculation (min) (ACUTE ONLY): 18 min   Charges:   PT Evaluation $PT Eval Low Complexity: [place], PT, DPT Acute Rehabilitation Services  Pager [phone] Office [place] 10/22/2020, 2:08 PM

## 2020-10-23 DIAGNOSIS — K7460 Unspecified cirrhosis of liver: Secondary | ICD-10-CM | POA: Diagnosis not present

## 2020-10-23 DIAGNOSIS — E8770 Fluid overload, unspecified: Secondary | ICD-10-CM | POA: Diagnosis not present

## 2020-10-23 LAB — MAGNESIUM: Magnesium: 1.7 mg/dL (ref 1.7–2.4)

## 2020-10-23 LAB — BASIC METABOLIC PANEL
Anion gap: 5 (ref 5–15)
BUN: 5 mg/dL — ABNORMAL LOW (ref 8–23)
CO2: 26 mmol/L (ref 22–32)
Calcium: 8.3 mg/dL — ABNORMAL LOW (ref 8.9–10.3)
Chloride: 104 mmol/L (ref 98–111)
Creatinine, Ser: 0.51 mg/dL (ref 0.44–1.00)
GFR, Estimated: >60 mL/min (ref >60)
Glucose, Bld: 83 mg/dL (ref 70–99)
Potassium: 2.9 mmol/L — ABNORMAL LOW (ref 3.5–5.1)
Sodium: 135 mmol/L (ref 135–145)

## 2020-10-23 LAB — POTASSIUM: Potassium: 3.4 mmol/L — ABNORMAL LOW (ref 3.5–5.1)

## 2020-10-23 MED ORDER — FUROSEMIDE 40 MG PO TABS: 40.0000 mg | ORAL_TABLET | Freq: Every day | ORAL | 1 refills | Status: DC

## 2020-10-23 MED ORDER — SPIRONOLACTONE 25 MG PO TABS: 25.0000 mg | ORAL_TABLET | Freq: Every day | ORAL | 1 refills | Status: DC

## 2020-10-23 MED ORDER — POTASSIUM CHLORIDE CRYS ER 20 MEQ PO TBCR
40.0000 meq | EXTENDED_RELEASE_TABLET | Freq: Two times a day (BID) | ORAL | Status: DC
  Administered 2020-10-23: 40 meq via ORAL
  Filled 2020-10-23: qty 2

## 2020-10-23 NOTE — Plan of Care (Signed)

## 2020-10-23 NOTE — Progress Notes (Signed)
D/C instructions given and reviewed. Tele and IV removed, tolerated well. Family on the way to transport home.

## 2020-10-23 NOTE — Progress Notes (Signed)
K 2.9 this a.m. Notified Dr Tonie Griffith via Amnion text page. See new orders.

## 2020-10-23 NOTE — Discharge Summary (Signed)
Physician Discharge Summary  [name] LOV:[id number] DOB: 1951-02-07 DOA: 10/21/2020  PCP: Abner Greenspan, MD  Admit date: 10/21/2020 Discharge date: 10/23/2020  Admitted From: Home Disposition:  Home.   Recommendations for Outpatient Follow-up:  1. Follow up with PCP in 1-2 weeks 2. Please obtain BMP/CBC on Monday.   Discharge Condition:stable. CODE STATUS: Full code.  Diet recommendation: Heart Healthy   Brief/Interim Summary:   70 year old with Prior history of hypertension, breast cancer s/p radiation, hypothyroidism, Selena Johnson cirrhosis presents with bilateral lower extremity edema associated with some orthopnea, dyspnea on exertion.  Patient was admitted for evaluation of CHF.  She was started on IV Lasix and cardiology consulted for evaluation of CHF.   Discharge Diagnoses:  Principal Problem:   Volume overload Active Problems:   Hyperlipidemia   Essential hypertension   TRANSAMINASES, SERUM, ELEVATED   Hepatic cirrhosis (HCC)   Malignant neoplasm of upper-inner quadrant of left breast in female, estrogen receptor positive (HCC)   CHF exacerbation (HCC)  Sob on exertion, leg edema  Evaluate for CHF,  Elevated BNP Echocardiogram ordered and wnl.  Cardiology consulted, recommended lasix 40 mg daily and spirolactone on discharge.     Hypertension:  Well controlled.    Cirrhosis   Breast cancer  status postradiation therapy, Dex.   Hyperlipidemia    Hypothyroidism Continue Synthroid at 200 MCG daily.   Hypokalemia Replaced    Discharge Instructions  Discharge Instructions    Diet - low sodium heart healthy   Complete by: As directed    Diet - low sodium heart healthy   Complete by: As directed    Discharge instructions   Complete by: As directed    Please follow up with PCP in one week and check labs at the office visit.   Increase activity slowly   Complete by: As directed      Allergies as of 10/23/2020      Reactions    Codeine    REACTION: Throat swelling   Ivp Dye [iodinated Diagnostic Agents] Swelling      Medication List    STOP taking these medications   triamterene-hydrochlorothiazide 37.5-25 MG tablet Commonly known as: MAXZIDE-25     TAKE these medications   anastrozole 1 MG tablet Commonly known as: ARIMIDEX Take 1 tablet by mouth daily   Biotin 2500 MCG Caps Take 5,000 mcg by mouth daily.   furosemide 40 MG tablet Commonly known as: LASIX Take 1 tablet (40 mg total) by mouth daily.   levothyroxine 200 MCG tablet Commonly known as: SYNTHROID Take 1 tablet (200 mcg total) by mouth daily before breakfast.   loratadine 10 MG tablet Commonly known as: CLARITIN Take 10 mg by mouth daily as needed.   metoprolol succinate 100 MG 24 hr tablet Commonly known as: TOPROL-XL Take 1 by mouth daily with or after a meal What changed:   how much to take  how to take this  when to take this  additional instructions   multivitamin capsule Take 1 capsule by mouth daily.   potassium chloride SA 20 MEQ tablet Commonly known as: KLOR-CON Take 1 tablet by mouth every day What changed:   how much to take  how to take this  when to take this  additional instructions   spironolactone 25 MG tablet Commonly known as: ALDACTONE Take 1 tablet (25 mg total) by mouth daily.   Vitamin D3 50 MCG (2000 UT) Tabs Take 2,000 Units by mouth daily.  Allergies  Allergen Reactions  . Codeine     REACTION: Throat swelling  . Ivp Dye [Iodinated Diagnostic Agents] Swelling    Consultations:  Cardiology.    Procedures/Studies: DG Chest 2 View  Result Date: 10/21/2020 CLINICAL DATA:  70 year old female with increasing lower extremity swelling, numbness, shortness of breath and wheezing. Former smoker. EXAM: CHEST - 2 VIEW COMPARISON:  Chest radiograph 12/23/2004. FINDINGS: Lung volumes and mediastinal contours remain normal. Visualized tracheal air column is within normal limits.  No pneumothorax, pleural effusion, pulmonary edema or confluent pulmonary opacity. Mild diffuse interstitial markings have increased since 2006, probably smoking related. No acute osseous abnormality identified. Cholecystectomy clips in the upper abdomen. Negative visible bowel gas pattern. IMPRESSION: No acute cardiopulmonary abnormality. Electronically Signed   By: Genevie Ann M.D.   On: 10/21/2020 11:15   ECHOCARDIOGRAM COMPLETE  Result Date: 10/22/2020    ECHOCARDIOGRAM REPORT   Patient Name:   Selena Johnson Date of Exam: 10/22/2020 Medical Rec #:  [id number]      Height:       65.0 in Accession #:    [id number]     Weight:       212.7 lb Date of Birth:  1951/04/20       BSA:          2.031 m Patient Age:    74 years       BP:           141/74 mmHg Patient Gender: F              HR:           77 bpm. Exam Location:  Inpatient Procedure: 2D Echo, Cardiac Doppler, Color Doppler and Intracardiac            Opacification Agent Indications:    Dyspnea  History:        Patient has no prior history of Echocardiogram examinations.                 CHF; Risk Factors:Dyslipidemia, Hypertension and Former Smoker.  Sonographer:    Clayton Lefort RDCS (AE) Referring Phys: [id number] [facility]  Sonographer Comments: Suboptimal subcostal window, patient is morbidly obese and Technically difficult study due to poor echo windows. IMPRESSIONS  1. Left ventricular ejection fraction, by estimation, is 55 to 60%. The left ventricle has normal function. The left ventricle has no regional wall motion abnormalities. Left ventricular diastolic parameters were normal.  2. Right ventricular systolic function is normal. The right ventricular size is normal.  3. Left atrial size was moderately dilated.  4. The mitral valve is normal in structure. Trivial mitral valve regurgitation. No evidence of mitral stenosis.  5. The aortic valve is tricuspid. Aortic valve regurgitation is not visualized. No aortic stenosis is present. FINDINGS  Left Ventricle:  Left ventricular ejection fraction, by estimation, is 55 to 60%. The left ventricle has normal function. The left ventricle has no regional wall motion abnormalities. Definity contrast agent was given IV to delineate the left ventricular  endocardial borders. The left ventricular internal cavity size was normal in size. There is no left ventricular hypertrophy. Left ventricular diastolic parameters were normal. Right Ventricle: The right ventricular size is normal. No increase in right ventricular wall thickness. Right ventricular systolic function is normal. Left Atrium: Left atrial size was moderately dilated. Right Atrium: Right atrial size was normal in size. Pericardium: There is no evidence of pericardial effusion. Mitral Valve: The mitral valve is normal in structure.  Trivial mitral valve regurgitation. No evidence of mitral valve stenosis. MV peak gradient, 4.2 mmHg. The mean mitral valve gradient is 2.0 mmHg. Tricuspid Valve: The tricuspid valve is normal in structure. Tricuspid valve regurgitation is trivial. No evidence of tricuspid stenosis. Aortic Valve: The aortic valve is tricuspid. Aortic valve regurgitation is not visualized. No aortic stenosis is present. Aortic valve mean gradient measures 7.0 mmHg. Aortic valve peak gradient measures 13.1 mmHg. Aortic valve area, by VTI measures 1.98  cm. Pulmonic Valve: The pulmonic valve was normal in structure. Pulmonic valve regurgitation is not visualized. No evidence of pulmonic stenosis. Aorta: The aortic root is normal in size and structure. Venous: The inferior vena cava was not well visualized. IAS/Shunts: No atrial level shunt detected by color flow Doppler.  LEFT VENTRICLE PLAX 2D LVIDd:         4.70 cm  Diastology LVIDs:         3.30 cm  LV e' medial:    8.16 cm/s LV PW:         1.00 cm  LV E/e' medial:  12.0 LV IVS:        0.90 cm  LV e' lateral:   12.80 cm/s LVOT diam:     1.90 cm  LV E/e' lateral: 7.7 LV SV:         72 LV SV Index:   35 LVOT  Area:     2.84 cm  RIGHT VENTRICLE RV Basal diam:  2.70 cm RV S prime:     13.60 cm/s TAPSE (M-mode): 2.8 cm LEFT ATRIUM             Index       RIGHT ATRIUM           Index LA diam:        4.80 cm 2.36 cm/m  RA Area:     14.60 cm LA Vol (A2C):   94.0 ml 46.29 ml/m RA Volume:   30.90 ml  15.22 ml/m LA Vol (A4C):   64.6 ml 31.81 ml/m LA Biplane Vol: 78.8 ml 38.81 ml/m  AORTIC VALVE AV Area (Vmax):    2.07 cm AV Area (Vmean):   2.06 cm AV Area (VTI):     1.98 cm AV Vmax:           181.00 cm/s AV Vmean:          120.000 cm/s AV VTI:            0.362 m AV Peak Grad:      13.1 mmHg AV Mean Grad:      7.0 mmHg LVOT Vmax:         132.00 cm/s LVOT Vmean:        87.300 cm/s LVOT VTI:          0.253 m LVOT/AV VTI ratio: 0.70  AORTA Ao Root diam: 3.10 cm Ao Asc diam:  2.60 cm MITRAL VALVE               TRICUSPID VALVE MV Area (PHT): 3.91 cm    TR Peak grad:   24.0 mmHg MV Area VTI:   3.01 cm    TR Vmax:        245.00 cm/s MV Peak grad:  4.2 mmHg MV Mean grad:  2.0 mmHg    SHUNTS MV Vmax:       1.03 m/s    Systemic VTI:  0.25 m MV Vmean:      61.8 cm/s   Systemic  Diam: 1.90 cm MV Decel Time: 194 msec MV E velocity: 98.30 cm/s MV A velocity: 94.50 cm/s MV E/A ratio:  1.04 Skeet Latch MD Electronically signed by Skeet Latch MD Signature Date/Time: 10/22/2020/2:59:38 PM    Final    VAS [place] LOWER EXTREMITY VENOUS (DVT) (ONLY [facility] & [facility])  Result Date: 10/21/2020  Lower Venous DVT Study Patient Name:  ISABELLAMARIE RANDA  Date of Exam:   10/21/2020 Medical Rec #: [id number]       Accession #:    [id number] Date of Birth: 1950/10/24        Patient Gender: F Patient Age:   70Y Exam Location:  [facility] Procedure:      VAS [place] LOWER EXTREMITY VENOUS (DVT) Referring Phys: [place] --------------------------------------------------------------------------------  Indications: Edema.  Limitations: Poor ultrasound/tissue interface. Comparison Study: no prior Performing Technologist: Abram Sander RVS   Examination Guidelines: A complete evaluation includes B-mode imaging, spectral Doppler, color Doppler, and power Doppler as needed of all accessible portions of each vessel. Bilateral testing is considered an integral part of a complete examination. Limited examinations for reoccurring indications may be performed as noted. The reflux portion of the exam is performed with the patient in reverse Trendelenburg.  +---------+---------------+---------+-----------+----------+--------------+ RIGHT    CompressibilityPhasicitySpontaneityPropertiesThrombus Aging +---------+---------------+---------+-----------+----------+--------------+ CFV      Full           Yes      Yes                                 +---------+---------------+---------+-----------+----------+--------------+ SFJ      Full                                                        +---------+---------------+---------+-----------+----------+--------------+ FV Prox  Full                                                        +---------+---------------+---------+-----------+----------+--------------+ FV Mid                  Yes      Yes                                 +---------+---------------+---------+-----------+----------+--------------+ FV Distal               Yes      Yes                                 +---------+---------------+---------+-----------+----------+--------------+ PFV      Full                                                        +---------+---------------+---------+-----------+----------+--------------+ POP      Full           Yes  Yes                                 +---------+---------------+---------+-----------+----------+--------------+ PTV      Full                                                        +---------+---------------+---------+-----------+----------+--------------+ PERO     Full                                                         +---------+---------------+---------+-----------+----------+--------------+   +---------+---------------+---------+-----------+----------+--------------+ LEFT     CompressibilityPhasicitySpontaneityPropertiesThrombus Aging +---------+---------------+---------+-----------+----------+--------------+ CFV      Full           Yes      Yes                                 +---------+---------------+---------+-----------+----------+--------------+ SFJ      Full                                                        +---------+---------------+---------+-----------+----------+--------------+ FV Prox  Full                                                        +---------+---------------+---------+-----------+----------+--------------+ FV Mid                  Yes      Yes                                 +---------+---------------+---------+-----------+----------+--------------+ FV Distal               Yes      Yes                                 +---------+---------------+---------+-----------+----------+--------------+ PFV      Full                                                        +---------+---------------+---------+-----------+----------+--------------+ POP      Full           Yes      Yes                                 +---------+---------------+---------+-----------+----------+--------------+ PTV      Full                                                        +---------+---------------+---------+-----------+----------+--------------+  PERO     Full                                                        +---------+---------------+---------+-----------+----------+--------------+     Summary: BILATERAL: - No evidence of deep vein thrombosis seen in the lower extremities, bilaterally. -No evidence of popliteal cyst, bilaterally.   *See table(s) above for measurements and observations. Electronically signed by Curt Jews MD on 10/21/2020 at 8:33:32 PM.     Final    US Abdomen Limited RUQ (LIVER/GB)  Result Date: 10/21/2020 CLINICAL DATA:  Cirrhosis.  History of prior cholecystectomy. EXAM: ULTRASOUND ABDOMEN LIMITED RIGHT UPPER QUADRANT COMPARISON:  None. FINDINGS: Gallbladder: The gallbladder is surgically absent. Common bile duct: Diameter: 8.4 mm Liver: No focal lesion identified. Diffusely decreased echogenicity of the liver parenchyma is noted. Portal vein is patent on color Doppler imaging with normal direction of blood flow towards the liver. Other: Right upper quadrant ascites is present. IMPRESSION: 1. Evidence of prior cholecystectomy. 2. Right upper quadrant ascites. Electronically Signed   By: Virgina Norfolk M.D.   On: 10/21/2020 22:08       Subjective: No new complaints.   Discharge Exam: Vitals:   10/23/20 0316 10/23/20 0747  BP: 138/63 134/63  Pulse: 71 70  Resp:    Temp: 98 F (36.7 C) 98.1 F (36.7 C)  SpO2: 98% 98%   Vitals:   10/22/20 2029 10/23/20 0316 10/23/20 0500 10/23/20 0747  BP: 129/69 138/63  134/63  Pulse: 72 71  70  Resp: 16     Temp: 98.5 F (36.9 C) 98 F (36.7 C)  98.1 F (36.7 C)  TempSrc: Oral Oral  Oral  SpO2: 98% 98%  98%  Weight:   96.7 kg   Height:        General: Pt is alert, awake, not in acute distress Cardiovascular: RRR, S1/S2 +, no rubs, no gallops Respiratory: CTA bilaterally, no wheezing, no rhonchi Abdominal: Soft, NT, ND, bowel sounds + Extremities: no edema, no cyanosis    The results of significant diagnostics from this hospitalization (including imaging, microbiology, ancillary and laboratory) are listed below for reference.     Microbiology: Recent Results (from the past 240 hour(s))  Resp Panel by RT-PCR (Flu A&B, Covid) Nasopharyngeal Swab     Status: None   Collection Time: 10/21/20  5:33 PM   Specimen: Nasopharyngeal Swab; Nasopharyngeal(NP) swabs in vial transport medium  Result Value Ref Range Status   SARS Coronavirus 2 by RT PCR NEGATIVE NEGATIVE  Final    Comment: (NOTE) SARS-CoV-2 target nucleic acids are NOT DETECTED.  The SARS-CoV-2 RNA is generally detectable in upper respiratory specimens during the acute phase of infection. The lowest concentration of SARS-CoV-2 viral copies this assay can detect is 138 copies/mL. A negative result does not preclude SARS-Cov-2 infection and should not be used as the sole basis for treatment or other patient management decisions. A negative result may occur with  improper specimen collection/handling, submission of specimen other than nasopharyngeal swab, presence of viral mutation(s) within the areas targeted by this assay, and inadequate number of viral copies(<138 copies/mL). A negative result must be combined with clinical observations, patient history, and epidemiological information. The expected result is Negative.  Fact Sheet for Patients:  EntrepreneurPulse.com.au  Fact Sheet for Healthcare Providers:  IncredibleEmployment.be  This test is no t yet approved or cleared by the [place] and  has been authorized for detection and/or diagnosis of SARS-CoV-2 by FDA under an Emergency Use Authorization (EUA). This EUA will remain  in effect (meaning this test can be used) for the duration of the COVID-19 declaration under Section 564(b)(1) of the Act, 21 U.S.C.section 360bbb-3(b)(1), unless the authorization is terminated  or revoked sooner.       Influenza A by PCR NEGATIVE NEGATIVE Final   Influenza B by PCR NEGATIVE NEGATIVE Final    Comment: (NOTE) The Xpert Xpress SARS-CoV-2/FLU/RSV plus assay is intended as an aid in the diagnosis of influenza from Nasopharyngeal swab specimens and should not be used as a sole basis for treatment. Nasal washings and aspirates are unacceptable for Xpert Xpress SARS-CoV-2/FLU/RSV testing.  Fact Sheet for Patients: EntrepreneurPulse.com.au  Fact Sheet for Healthcare  Providers: IncredibleEmployment.be  This test is not yet approved or cleared by the [place] FDA and has been authorized for detection and/or diagnosis of SARS-CoV-2 by FDA under an Emergency Use Authorization (EUA). This EUA will remain in effect (meaning this test can be used) for the duration of the COVID-19 declaration under Section 564(b)(1) of the Act, 21 U.S.C. section 360bbb-3(b)(1), unless the authorization is terminated or revoked.  Performed at [facility], [place] [address], [place], [place] [address]      Labs: BNP (last 3 results) Recent Labs    10/21/20 1130  BNP 789.3*   Basic Metabolic Panel: Recent Labs  Lab 10/21/20 1019 10/21/20 1842 10/22/20 0307 10/23/20 0510 10/23/20 1234  NA 138  --  139 135  --   K 3.3*  --  3.1* 2.9* 3.4*  CL 103  --  104 104  --   CO2 25  --  27 26  --   GLUCOSE 108*  --  85 83  --   BUN 6*  --  <5* 5*  --   CREATININE 0.59 0.59 0.54 0.51  --   CALCIUM 9.1  --  8.8* 8.3*  --   MG  --   --   --  1.7  --    Liver Function Tests: Recent Labs  Lab 10/21/20 1314 10/22/20 0307  AST 49* 42*  ALT 22 20  ALKPHOS 105 87  BILITOT 2.8* 2.3*  PROT 6.7 5.5*  ALBUMIN 3.4* 2.9*   No results for input(s): LIPASE, AMYLASE in the last 168 hours. No results for input(s): AMMONIA in the last 168 hours. CBC: Recent Labs  Lab 10/21/20 1019 10/21/20 1842 10/22/20 0307  WBC 4.4 5.8 3.9*  HGB 13.8 14.0 11.6*  HCT 41.1 40.6 33.2*  MCV 104.1* 103.3* 103.1*  PLT 96* 106* 82*   Cardiac Enzymes: No results for input(s): CKTOTAL, CKMB, CKMBINDEX, TROPONINI in the last 168 hours. BNP: Invalid input(s): POCBNP CBG: No results for input(s): GLUCAP in the last 168 hours. D-Dimer Recent Labs    10/21/20 1314  DDIMER 15.88*   Hgb A1c No results for input(s): HGBA1C in the last 72 hours. Lipid Profile No results for input(s): CHOL, HDL, LDLCALC, TRIG, CHOLHDL, LDLDIRECT in the last 72 hours. Thyroid  function studies Recent Labs    10/21/20 1842  TSH 0.634   Anemia work up No results for input(s): VITAMINB12, FOLATE, FERRITIN, TIBC, IRON, RETICCTPCT in the last 72 hours. Urinalysis    Component Value Date/Time   BILIRUBINUR neg. 05/21/2013 1623   PROTEINUR neg. 05/21/2013 1623   UROBILINOGEN  0.2 05/21/2013 1623   NITRITE neg. 05/21/2013 1623   LEUKOCYTESUR small (1+) 05/21/2013 1623   Sepsis Labs Invalid input(s): PROCALCITONIN,  WBC,  LACTICIDVEN Microbiology Recent Results (from the past 240 hour(s))  Resp Panel by RT-PCR (Flu A&B, Covid) Nasopharyngeal Swab     Status: None   Collection Time: 10/21/20  5:33 PM   Specimen: Nasopharyngeal Swab; Nasopharyngeal(NP) swabs in vial transport medium  Result Value Ref Range Status   SARS Coronavirus 2 by RT PCR NEGATIVE NEGATIVE Final    Comment: (NOTE) SARS-CoV-2 target nucleic acids are NOT DETECTED.  The SARS-CoV-2 RNA is generally detectable in upper respiratory specimens during the acute phase of infection. The lowest concentration of SARS-CoV-2 viral copies this assay can detect is 138 copies/mL. A negative result does not preclude SARS-Cov-2 infection and should not be used as the sole basis for treatment or other patient management decisions. A negative result may occur with  improper specimen collection/handling, submission of specimen other than nasopharyngeal swab, presence of viral mutation(s) within the areas targeted by this assay, and inadequate number of viral copies(<138 copies/mL). A negative result must be combined with clinical observations, patient history, and epidemiological information. The expected result is Negative.  Fact Sheet for Patients:  EntrepreneurPulse.com.au  Fact Sheet for Healthcare Providers:  IncredibleEmployment.be  This test is no t yet approved or cleared by the [place] FDA and  has been authorized for detection and/or diagnosis of  SARS-CoV-2 by FDA under an Emergency Use Authorization (EUA). This EUA will remain  in effect (meaning this test can be used) for the duration of the COVID-19 declaration under Section 564(b)(1) of the Act, 21 U.S.C.section 360bbb-3(b)(1), unless the authorization is terminated  or revoked sooner.       Influenza A by PCR NEGATIVE NEGATIVE Final   Influenza B by PCR NEGATIVE NEGATIVE Final    Comment: (NOTE) The Xpert Xpress SARS-CoV-2/FLU/RSV plus assay is intended as an aid in the diagnosis of influenza from Nasopharyngeal swab specimens and should not be used as a sole basis for treatment. Nasal washings and aspirates are unacceptable for Xpert Xpress SARS-CoV-2/FLU/RSV testing.  Fact Sheet for Patients: EntrepreneurPulse.com.au  Fact Sheet for Healthcare Providers: IncredibleEmployment.be  This test is not yet approved or cleared by the [place] FDA and has been authorized for detection and/or diagnosis of SARS-CoV-2 by FDA under an Emergency Use Authorization (EUA). This EUA will remain in effect (meaning this test can be used) for the duration of the COVID-19 declaration under Section 564(b)(1) of the Act, 21 U.S.C. section 360bbb-3(b)(1), unless the authorization is terminated or revoked.  Performed at [facility], [place] [address], [place], [place] [address]      Time coordinating discharge: Over 36 minutes.   SIGNED:   Hosie Poisson, MD  [facility] 10/23/2020, 7:03 PM

## 2020-10-26 ENCOUNTER — Telehealth: Payer: Self-pay

## 2020-10-26 NOTE — Telephone Encounter (Signed)
Transition Care Management Follow-up Telephone Call  Date of discharge and from where: 10/23/2020[facility]   How have you been since you were released from the hospital? Patient is doing much better. No complaints at this time.  Any questions or concerns? No  Items Reviewed:  Did the pt receive and understand the discharge instructions provided? Yes   Medications obtained and verified? Yes   Other? No   Any new allergies since your discharge? No   Dietary orders reviewed? Yes  Do you have support at home? Yes   Home Care and Equipment/Supplies: Were home health services ordered? no If so, what is the name of the agency? N/A  Has the agency set up a time to come  to the patient's home? not applicable Were any new equipment or medical supplies ordered?  No What is the name of the medical supply agency? N/A Were you able to get the supplies/equipment? not applicable Do you have any questions related to the use of the equipment or supplies? No  Functional Questionnaire: (I = Independent and D = Dependent) ADLs: I  Bathing/Dressing- I  Meal Prep- I  Eating- I  Maintaining continence- I  Transferring/Ambulation- I  Managing Meds- I  Follow up appointments reviewed:   PCP Hospital f/u appt confirmed? Yes  Scheduled to see Dr. Glori Bickers on 10/27/2020 @ 9 am.  [facility] f/u appt confirmed? N/A   Are transportation arrangements needed? No   If their condition worsens, is the pt aware to call PCP or go to the Emergency Dept.? Yes  Was the patient provided with contact information for the PCP's office or ED? Yes  Was to pt encouraged to call back with questions or concerns? Yes

## 2020-10-27 ENCOUNTER — Other Ambulatory Visit: Payer: Self-pay

## 2020-10-27 ENCOUNTER — Ambulatory Visit (INDEPENDENT_AMBULATORY_CARE_PROVIDER_SITE_OTHER): Payer: Medicare Other | Admitting: Family Medicine

## 2020-10-27 ENCOUNTER — Encounter: Payer: Self-pay | Admitting: Family Medicine

## 2020-10-27 VITALS — BP 118/72 | HR 55 | Temp 97.1°F | Ht 65.0 in | Wt 212.4 lb

## 2020-10-27 DIAGNOSIS — E876 Hypokalemia: Secondary | ICD-10-CM | POA: Diagnosis not present

## 2020-10-27 DIAGNOSIS — I8510 Secondary esophageal varices without bleeding: Secondary | ICD-10-CM | POA: Diagnosis not present

## 2020-10-27 DIAGNOSIS — I10 Essential (primary) hypertension: Secondary | ICD-10-CM

## 2020-10-27 DIAGNOSIS — R600 Localized edema: Secondary | ICD-10-CM

## 2020-10-27 DIAGNOSIS — E039 Hypothyroidism, unspecified: Secondary | ICD-10-CM

## 2020-10-27 DIAGNOSIS — E8770 Fluid overload, unspecified: Secondary | ICD-10-CM

## 2020-10-27 DIAGNOSIS — K7460 Unspecified cirrhosis of liver: Secondary | ICD-10-CM

## 2020-10-27 DIAGNOSIS — I872 Venous insufficiency (chronic) (peripheral): Secondary | ICD-10-CM | POA: Diagnosis not present

## 2020-10-27 LAB — COMPREHENSIVE METABOLIC PANEL
ALT: 19 U/L (ref 0–35)
AST: 42 U/L — ABNORMAL HIGH (ref 0–37)
Albumin: 3.7 g/dL (ref 3.5–5.2)
Alkaline Phosphatase: 94 U/L (ref 39–117)
BUN: 9 mg/dL (ref 6–23)
CO2: 27 mEq/L (ref 19–32)
Calcium: 9.1 mg/dL (ref 8.4–10.5)
Chloride: 102 mEq/L (ref 96–112)
Creatinine, Ser: 0.47 mg/dL (ref 0.40–1.20)
GFR: 95.35 mL/min (ref >60.00)
Glucose, Bld: 91 mg/dL (ref 70–99)
Potassium: 3.5 mEq/L (ref 3.5–5.1)
Sodium: 139 mEq/L (ref 135–145)
Total Bilirubin: 2.6 mg/dL — ABNORMAL HIGH (ref 0.2–1.2)
Total Protein: 6.7 g/dL (ref 6.0–8.3)

## 2020-10-27 NOTE — Assessment & Plan Note (Signed)
Suspect this is one of the bigger causes of her pedal edema since echocardiogram was unrevealing, her abd US showed some ascites and albumin is low  Reviewed hospital records, lab results and studies in detail   AST is 42 and tili total is 2.3  Diuresed well with spironolactone Continues metoprolol for BP and esophageal varices  Ref to GI for f/u of this  Labs today

## 2020-10-27 NOTE — Assessment & Plan Note (Signed)
Noted in hospital recently for pedal edema  This was corrected Now taking spironolactone instead of maxzide Continues lasix 40 mg daily  Also klor con 20 meq daily Labs today

## 2020-10-27 NOTE — Assessment & Plan Note (Signed)
Pt continues metoprolol  No s/s of GI bleed

## 2020-10-27 NOTE — Assessment & Plan Note (Signed)
Lab Results  Component Value Date   TSH 0.634 10/21/2020   Continues levothyroxine 200 mg daily  Doubt this is responsible for fluid overload

## 2020-10-27 NOTE — Assessment & Plan Note (Signed)
Recent hospitalization for fluid overload  Reviewed hospital records, lab results and studies in detail   Nl echocardiogram  Low albumin and ascites in setting of cirrhosis  Much improvement with aggressive IV lasix followed by spinonolactone  Legs are still swollen but no longer severely so, no weeping or pain and no s/s of cellulitis Venous insufficiency also adds to , pt cannot get supp stockings on  Ref made to vascular to help with this Also GI for re eval of cirrhosis  Labs today

## 2020-10-27 NOTE — Assessment & Plan Note (Signed)
No doubt playing a role in her pedal edema  Would like another strategy since she has trouble getting support hose on  Will ref to vascular (vein) specialist to see if they can work with her

## 2020-10-27 NOTE — Progress Notes (Signed)
Subjective:    Patient ID: Selena Johnson, female    DOB: 03/27/1951, 70 y.o.   MRN: [id number]  This visit occurred during the SARS-CoV-2 public health emergency.  Safety protocols were in place, including screening questions prior to the visit, additional usage of staff PPE, and extensive cleaning of exam room while observing appropriate contact time as indicated for disinfecting solutions.    HPI Pt presents for f/u of hospitalization from 5/18 to 10/23/20 for CHF  Wt Readings from Last 3 Encounters:  10/27/20 212 lb 6.4 oz (96.3 kg)  10/23/20 213 lb 3 oz (96.7 kg)  09/07/20 215 lb 9 oz (97.8 kg)   35.35 kg/m  She presented to the ER on day of admit with LE edema and pnd/orthopnea  BNP was elevated  She thought cough/wheeze was from pollen  Her legs wre weeping   She was treated with lasix , then spironolactone on discharge Cardiology was consulted  Flu and covid tests negative   Cardiology consulted  Diuresed 1.5 L with IV lasix  Did not think HF is contributing to edema  Albumin levels were nl and blood flow to liver nl so unlikely cirrhosis is cause of edema (albumin 2.9)  Unable to wear compression socks  Disc UNNA boots  Discussed venous stasis  For low K -changed maxzide to spironolactone and lasix 40 mg daily   HTN stayed well controlled  triam-hct was stopped  On spironolactone- thinks it is working better  Legs are still swollen - no longer weeping    Lab Results  Component Value Date   CREATININE 0.51 10/23/2020   BUN 5 (L) 10/23/2020   NA 135 10/23/2020   K 3.4 (L) 10/23/2020   CL 104 10/23/2020   CO2 26 10/23/2020   GFR over 60 Glucose 83 Lab Results  Component Value Date   WBC 3.9 (L) 10/22/2020   HGB 11.6 (L) 10/22/2020   HCT 33.2 (L) 10/22/2020   MCV 103.1 (H) 10/22/2020   PLT 82 (L) 10/22/2020   Lab Results  Component Value Date   TSH 0.634 10/21/2020     Lab Results  Component Value Date   ALT 20 10/22/2020   AST 42 (H)  10/22/2020   ALKPHOS 87 10/22/2020   BILITOT 2.3 (H) 10/22/2020    abd [place]: US Abdomen Limited RUQ (LIVER/GB) (Accession [id number]) (Order [id number]) Imaging Date: 10/21/2020 Department: [facility] Released By/Authorizing: Donne Hazel, MD (auto-released)    Exam Status  Status  Final [99]   PACS Intelerad Image Link  Show images for US Abdomen Limited RUQ (LIVER/GB)  Study Result  Narrative & Impression  CLINICAL DATA:  Cirrhosis.  History of prior cholecystectomy.  EXAM: ULTRASOUND ABDOMEN LIMITED RIGHT UPPER QUADRANT  COMPARISON:  None.  FINDINGS: Gallbladder:  The gallbladder is surgically absent.  Common bile duct:  Diameter: 8.4 mm  Liver:  No focal lesion identified. Diffusely decreased echogenicity of the liver parenchyma is noted. Portal vein is patent on color Doppler imaging with normal direction of blood flow towards the liver.  Other: Right upper quadrant ascites is present.  IMPRESSION: 1. Evidence of prior cholecystectomy. 2. Right upper quadrant ascites.   Electronically Signed   By: Virgina Norfolk M.D.   On: 10/21/2020 22:08    Has f/u with [facility] in sept   2D echo  1. Left ventricular ejection fraction, by estimation, is 55 to 60%. The  left ventricle has normal function. The left ventricle  has no regional  wall motion abnormalities. Left ventricular diastolic parameters were  normal.  2. Right ventricular systolic function is normal. The right ventricular  size is normal.  3. Left atrial size was moderately dilated.  4. The mitral valve is normal in structure. Trivial mitral valve  regurgitation. No evidence of mitral stenosis.  5. The aortic valve is tricuspid. Aortic valve regurgitation is not  visualized. No aortic stenosis is present.   Today BP Readings from Last 3 Encounters:  10/27/20 118/72  10/23/20 134/63  09/07/20 122/68   Pulse Readings from Last 3 Encounters:   10/27/20 (!) 55  10/23/20 70  09/07/20 76   She is elevating feet  Has varicose veins  Had spider veins treated in the past    Patient Active Problem List   Diagnosis Date Noted  . Venous insufficiency 10/27/2020  . Volume overload 10/21/2020  . Pedal edema 10/21/2020  . Caregiver stress 09/07/2020  . Secondary esophageal varices without bleeding ([place]) 09/07/2020  . Malignant neoplasm of upper-inner quadrant of left breast in female, estrogen receptor positive ([place]) 06/27/2018  . Left breast mass 06/17/2018  . Elevated random blood glucose level 07/28/2017  . Skin cancer screening 05/05/2017  . Colon cancer screening 07/21/2015  . Estrogen deficiency 07/21/2015  . Calcification of right breast 05/06/2015  . Encounter for Medicare annual wellness exam 07/20/2014  . Unspecified constipation 10/25/2012  . Hepatic cirrhosis ([place]) 10/23/2012  . Hypokalemia 07/13/2012  . Thrombocytopenia ([place]) 11/10/2010  . Routine general medical examination at a health care facility 11/04/2010  . Hyperlipidemia 03/18/2008  . Obesity 03/18/2008  . TRANSAMINASES, SERUM, ELEVATED 03/18/2008  . MENOPAUSE-RELATED VASOMOTOR SYMPTOMS, HOT FLASHES 10/23/2006  . Hypothyroidism 10/11/2006  . GLAUCOMA 10/11/2006  . Essential hypertension 10/11/2006  . GERD 10/11/2006  . History of kidney stones 10/11/2006   Past Medical History:  Diagnosis Date  . Allergy   . Cancer ([place])    basal cell skin CA  . Chronic kidney disease    hx of kidney stones  . Conjunctivitis    chronic  . Fatty liver 2015  . GERD (gastroesophageal reflux disease)   . Glaucoma   . Hearing loss in left ear   . History of kidney stones   . Hyperlipidemia    no per pt  . Hypertension   . Menopausal syndrome   . NASH (nonalcoholic steatohepatitis)    with cirrhosis and mild esoph varicies  . Primary hypothyroidism    Past Surgical History:  Procedure Laterality Date  . ABDOMINAL HYSTERECTOMY     partial ? prolapse  .  BREAST LUMPECTOMY WITH RADIOACTIVE SEED AND SENTINEL LYMPH NODE BIOPSY Left 07/12/2018   Procedure: LEFT BREAST LUMPECTOMY WITH RADIOACTIVE SEED AND LEFT AXILLARY SENTINEL LYMPH NODE BIOPSY;  Surgeon: Rolm Bookbinder, MD;  Location: [facility];  Service: General;  Laterality: Left;  . CHOLECYSTECTOMY    . ESOPHAGOGASTRODUODENOSCOPY    . TONSILLECTOMY     Social History   Tobacco Use  . Smoking status: Former Smoker    Quit date: 06/06/1982    Years since quitting: 38.4  . Smokeless tobacco: Never Used  Vaping Use  . Vaping Use: Never used  Substance Use Topics  . Alcohol use: Yes    Alcohol/week: 0.0 standard drinks    Comment: wine- rare  . Drug use: No   Family History  Problem Relation Age of Onset  . Heart disease Mother        CAD  . Hypertension Mother   .  Glaucoma Mother   . Heart disease Father        CAD  . Diabetes Father   . Hypertension Father   . Cancer Father        ? CA - squamous cell carcinoma  . Colon cancer Neg Hx   . Esophageal cancer Neg Hx   . Rectal cancer Neg Hx   . Stomach cancer Neg Hx    Allergies  Allergen Reactions  . Codeine     REACTION: Throat swelling  . Ivp Dye [Iodinated Diagnostic Agents] Swelling   Current Outpatient Medications on File Prior to Visit  Medication Sig Dispense Refill  . anastrozole (ARIMIDEX) 1 MG tablet Take 1 tablet by mouth daily (Patient taking differently: Take 1 mg by mouth daily.) 90 tablet 3  . Biotin 2500 MCG CAPS Take 5,000 mcg by mouth daily.    . Cholecalciferol (VITAMIN D3) 2000 units TABS Take 2,000 Units by mouth daily.    . furosemide (LASIX) 40 MG tablet Take 1 tablet (40 mg total) by mouth daily. 30 tablet 1  . levothyroxine (SYNTHROID) 200 MCG tablet Take 1 tablet (200 mcg total) by mouth daily before breakfast. 15 tablet 0  . loratadine (CLARITIN) 10 MG tablet Take 10 mg by mouth daily as needed.    . metoprolol succinate (TOPROL-XL) 100 MG 24 hr tablet Take 1 by mouth daily with or after a meal  (Patient taking differently: Take 100 mg by mouth daily.) 90 tablet 3  . Multiple Vitamin (MULTIVITAMIN) capsule Take 1 capsule by mouth daily.    . potassium chloride SA (KLOR-CON) 20 MEQ tablet Take 1 tablet by mouth every day (Patient taking differently: Take 20 mEq by mouth daily.) 90 tablet 3  . spironolactone (ALDACTONE) 25 MG tablet Take 1 tablet (25 mg total) by mouth daily. 30 tablet 1   No current facility-administered medications on file prior to visit.    Review of Systems  Constitutional: Positive for fatigue. Negative for activity change, appetite change, fever and unexpected weight change.  HENT: Negative for congestion, ear pain, rhinorrhea, sinus pressure and sore throat.   Eyes: Negative for pain, redness and visual disturbance.  Respiratory: Negative for cough, chest tightness, shortness of breath and wheezing.   Cardiovascular: Positive for leg swelling. Negative for chest pain and palpitations.  Gastrointestinal: Negative for abdominal pain, blood in stool, constipation and diarrhea.  Endocrine: Negative for polydipsia and polyuria.  Genitourinary: Negative for dysuria, frequency and urgency.  Musculoskeletal: Negative for arthralgias, back pain and myalgias.  Skin: Negative for pallor and rash.  Allergic/Immunologic: Negative for environmental allergies.  Neurological: Negative for dizziness, syncope and headaches.  Hematological: Negative for adenopathy. Does not bruise/bleed easily.  Psychiatric/Behavioral: Negative for decreased concentration and dysphoric mood. The patient is not nervous/anxious.        Objective:   Physical Exam Constitutional:      General: She is not in acute distress.    Appearance: Normal appearance. She is well-developed. She is obese. She is not ill-appearing.  HENT:     Head: Normocephalic and atraumatic.  Eyes:     General: No scleral icterus.    Conjunctiva/sclera: Conjunctivae normal.     Pupils: Pupils are equal, round, and  reactive to light.  Neck:     Thyroid: No thyromegaly.     Vascular: No carotid bruit or JVD.  Cardiovascular:     Rate and Rhythm: Regular rhythm. Bradycardia present.     Pulses: Normal pulses.  Heart sounds: No murmur heard. No gallop.   Pulmonary:     Effort: Pulmonary effort is normal. No respiratory distress.     Breath sounds: Normal breath sounds. No stridor. No wheezing, rhonchi or rales.     Comments: No crackles Abdominal:     General: Abdomen is protuberant. Bowel sounds are normal. There is no distension or abdominal bruit.     Palpations: Abdomen is soft. There is hepatomegaly. There is no fluid wave, splenomegaly, mass or pulsatile mass.     Tenderness: There is no abdominal tenderness. There is no guarding. Negative signs include Murphy's sign.     Comments: Notable hepatomegaly without tenderness  Musculoskeletal:     Cervical back: Normal range of motion and neck supple. No tenderness.     Right lower leg: Edema present.     Left lower leg: Edema present.     Comments: 1-2 plus pedal edema  No weeping or skin changes    Lymphadenopathy:     Cervical: No cervical adenopathy.  Skin:    General: Skin is warm and dry.     Coloration: Skin is not jaundiced or pale.     Findings: No erythema or rash.  Neurological:     Mental Status: She is alert.     Coordination: Coordination normal.     Deep Tendon Reflexes: Reflexes are normal and symmetric. Reflexes normal.     Comments: No tremor   Psychiatric:        Mood and Affect: Mood normal.           Assessment & Plan:   Problem List Items Addressed This Visit      Cardiovascular and Mediastinum   Essential hypertension    BP has been stable during recent hospitalization for fluid overload BP: 118/72  Now on spironolactone 25 mg instead of maxzide  Improved pedal edema  Labs today      Secondary esophageal varices without bleeding (HCC)    Pt continues metoprolol  No s/s of GI bleed       Venous insufficiency    No doubt playing a role in her pedal edema  Would like another strategy since she has trouble getting support hose on  Will ref to vascular (vein) specialist to see if they can work with her       Relevant Orders   Ambulatory referral to Vascular Surgery     Digestive   Hepatic cirrhosis ([place])    Suspect this is one of the bigger causes of her pedal edema since echocardiogram was unrevealing, her abd US showed some ascites and albumin is low  Reviewed hospital records, lab results and studies in detail   AST is 42 and tili total is 2.3  Diuresed well with spironolactone Continues metoprolol for BP and esophageal varices  Ref to GI for f/u of this  Labs today       Relevant Orders   Ambulatory referral to Gastroenterology   Comprehensive metabolic panel (Completed)     Endocrine   Hypothyroidism    Lab Results  Component Value Date   TSH 0.634 10/21/2020   Continues levothyroxine 200 mg daily  Doubt this is responsible for fluid overload         Other   Hypokalemia    Noted in hospital recently for pedal edema  This was corrected Now taking spironolactone instead of maxzide Continues lasix 40 mg daily  Also klor con 20 meq daily Labs today  Volume overload    Recent hospitalization  Reviewed hospital records, lab results and studies in detail   Suspect this is caused by cirrhosis in setting of venous insufficiency  Much improved with IV lasix (now po) and spironolactone       Pedal edema - Primary    Recent hospitalization for fluid overload  Reviewed hospital records, lab results and studies in detail   Nl echocardiogram  Low albumin and ascites in setting of cirrhosis  Much improvement with aggressive IV lasix followed by spinonolactone  Legs are still swollen but no longer severely so, no weeping or pain and no s/s of cellulitis Venous insufficiency also adds to , pt cannot get supp stockings on  Ref made to vascular to help  with this Also GI for re eval of cirrhosis  Labs today      Relevant Orders   Ambulatory referral to Vascular Surgery   Comprehensive metabolic panel (Completed)

## 2020-10-27 NOTE — Assessment & Plan Note (Signed)
Recent hospitalization  Reviewed hospital records, lab results and studies in detail   Suspect this is caused by cirrhosis in setting of venous insufficiency  Much improved with IV lasix (now po) and spironolactone

## 2020-10-27 NOTE — Patient Instructions (Signed)
Take care of yourself  Avoid excess sodium Try to get most of your carbohydrates from produce (with the exception of white potatoes)  Eat less bread/pasta/rice/snack foods/cereals/sweets and other items from the middle of the grocery store (processed carbs) Elevate legs   I placed a referral to vascular/vein specialist  Also to GI for follow up of cirrhosis  You will get a call

## 2020-10-27 NOTE — Assessment & Plan Note (Signed)
BP has been stable during recent hospitalization for fluid overload BP: 118/72  Now on spironolactone 25 mg instead of maxzide  Improved pedal edema  Labs today

## 2020-10-28 ENCOUNTER — Encounter: Payer: Self-pay | Admitting: Nurse Practitioner

## 2020-10-29 ENCOUNTER — Other Ambulatory Visit: Payer: Self-pay | Admitting: *Deleted

## 2020-10-29 DIAGNOSIS — I872 Venous insufficiency (chronic) (peripheral): Secondary | ICD-10-CM

## 2020-11-01 ENCOUNTER — Other Ambulatory Visit: Payer: Self-pay | Admitting: Family Medicine

## 2020-11-11 ENCOUNTER — Other Ambulatory Visit: Payer: Self-pay | Admitting: Family Medicine

## 2020-11-25 ENCOUNTER — Other Ambulatory Visit: Payer: Self-pay

## 2020-11-25 ENCOUNTER — Ambulatory Visit (INDEPENDENT_AMBULATORY_CARE_PROVIDER_SITE_OTHER): Payer: Medicare Other | Admitting: Physician Assistant

## 2020-11-25 ENCOUNTER — Ambulatory Visit (HOSPITAL_COMMUNITY)
Admission: RE | Admit: 2020-11-25 | Discharge: 2020-11-25 | Disposition: A | Discharge status: Home / Self Care | Payer: Medicare Other | Source: Ambulatory Visit | Attending: Vascular Surgery | Admitting: Vascular Surgery

## 2020-11-25 VITALS — BP 157/82 | HR 82 | Temp 98.3°F | Resp 16 | Ht 65.0 in | Wt 213.3 lb

## 2020-11-25 DIAGNOSIS — I872 Venous insufficiency (chronic) (peripheral): Secondary | ICD-10-CM | POA: Diagnosis not present

## 2020-11-25 NOTE — Progress Notes (Signed)
[facility] [place]   Reason for referral: Swollen B leg  History of Present Illness  Selena Johnson is a 70 y.o. female who presents with chief complaint: swollen leg.  Patient notes, onset of swelling 6 months ago, associated with prolonged sitting and standing.  She developed weeping from both lower legs with increased edema and was seen in the [facility] ED on 10/21/20.  She was admitted for further work up.      Prior history of hypertension, breast cancer s/p radiation, hypothyroidism, [name] cirrhosis presents with bilateral lower extremity edema associated with some orthopnea, dyspnea on exertion.  Patient was admitted for evaluation of CHF.  She was started on IV Lasix.  She had lower extremity Dopplers that were negative for DVT.   She was referred to [facility] for further work up of venous insuffiencey.    She was given The patient has had no history of DVT, no history of varicose vein, no history of venous stasis ulcers, no history of  Lymphedema and positive history of skin changes in lower legs.  There is no family history of venous disorders.  The patient has  used compression stockings in the past.  Of note she is the sole care giver of her husband who is diagnosed with terminal cancer lung/liver.    Past Medical History:  Diagnosis Date   Allergy    Cancer ([place])    basal cell skin CA   Chronic kidney disease    hx of kidney stones   Conjunctivitis    chronic   Fatty liver 2015   GERD (gastroesophageal reflux disease)    Glaucoma    Hearing loss in left ear    History of kidney stones    Hyperlipidemia    no per pt   Hypertension    Menopausal syndrome    NASH (nonalcoholic steatohepatitis)    with cirrhosis and mild esoph varicies   Primary hypothyroidism     Past Surgical History:  Procedure Laterality Date   ABDOMINAL HYSTERECTOMY     partial ? prolapse   BREAST LUMPECTOMY WITH RADIOACTIVE SEED AND SENTINEL LYMPH NODE BIOPSY Left 07/12/2018    Procedure: LEFT BREAST LUMPECTOMY WITH RADIOACTIVE SEED AND LEFT AXILLARY SENTINEL LYMPH NODE BIOPSY;  Surgeon: Rolm Bookbinder, MD;  Location: [place] [facility];  Service: General;  Laterality: Left;   CHOLECYSTECTOMY     ESOPHAGOGASTRODUODENOSCOPY     TONSILLECTOMY      Social History   Socioeconomic History   Marital status: Married    Spouse name: Dwaine Deter. [place]   Number of children: Not on file   Years of education: Not on file   Highest education level: Not on file  Occupational History   Not on file  Tobacco Use   Smoking status: Former    Pack years: 0.00    Types: Cigarettes    Quit date: 06/06/1982    Years since quitting: 38.4   Smokeless tobacco: Never  Vaping Use   Vaping Use: Never used  Substance and Sexual Activity   Alcohol use: Yes    Alcohol/week: 0.0 standard drinks    Comment: wine- rare   Drug use: No   Sexual activity: Not on file  Other Topics Concern   Not on file  Social History Narrative   Not on file   Social Determinants of Health   Financial Resource Strain: Low Risk    Difficulty of Paying Living Expenses: Not hard at all  Food Insecurity: No  Food Insecurity   Worried About Charity fundraiser in the Last Year: Never true   Ran Out of Food in the Last Year: Never true  Transportation Needs: No Transportation Needs   Lack of Transportation (Medical): No   Lack of Transportation (Non-Medical): No  Physical Activity: Inactive   Days of Exercise per Week: 0 days   Minutes of Exercise per Session: 0 min  Stress: Stress Concern Present   Feeling of Stress : Rather much  Social Connections: Not on file  Intimate Partner Violence: Not At Risk   Fear of Current or Ex-Partner: No   Emotionally Abused: No   Physically Abused: No   Sexually Abused: No    Family History  Problem Relation Age of Onset   Heart disease Mother        CAD   Hypertension Mother    Glaucoma Mother    Heart disease Father        CAD   Diabetes Father    Hypertension  Father    Cancer Father        ? CA - squamous cell carcinoma   Colon cancer Neg Hx    Esophageal cancer Neg Hx    Rectal cancer Neg Hx    Stomach cancer Neg Hx     Current Outpatient Medications on File Prior to Visit  Medication Sig Dispense Refill   anastrozole (ARIMIDEX) 1 MG tablet Take 1 tablet by mouth daily (Patient taking differently: Take 1 mg by mouth daily.) 90 tablet 3   Biotin 2500 MCG CAPS Take 5,000 mcg by mouth daily.     Cholecalciferol (VITAMIN D3) 2000 units TABS Take 2,000 Units by mouth daily.     furosemide (LASIX) 40 MG tablet Take 1 tablet (40 mg total) by mouth daily. 30 tablet 1   levothyroxine (SYNTHROID) 200 MCG tablet Take 1 tablet (200 mcg total) by mouth daily before breakfast. 15 tablet 0   loratadine (CLARITIN) 10 MG tablet Take 10 mg by mouth daily as needed.     metoprolol succinate (TOPROL-XL) 100 MG 24 hr tablet Take 1 by mouth daily with or after a meal (Patient taking differently: Take 100 mg by mouth daily.) 90 tablet 3   Multiple Vitamin (MULTIVITAMIN) capsule Take 1 capsule by mouth daily.     potassium chloride SA (KLOR-CON) 20 MEQ tablet Take 1 tablet by mouth every day (Patient taking differently: Take 20 mEq by mouth daily.) 90 tablet 3   spironolactone (ALDACTONE) 25 MG tablet Take 1 tablet (25 mg total) by mouth daily. 30 tablet 1   No current facility-administered medications on file prior to visit.    Allergies as of 11/25/2020 - Review Complete 11/25/2020  Allergen Reaction Noted   Codeine  10/11/2006   Ivp dye [iodinated diagnostic agents] Swelling 11/10/2010     ROS:   General:  No weight loss, Fever, chills  HEENT: No recent headaches, no nasal bleeding, no visual changes, no sore throat  Neurologic: No dizziness, blackouts, seizures. No recent symptoms of stroke or mini- stroke. No recent episodes of slurred speech, or temporary blindness.  Cardiac: No recent episodes of chest pain/pressure, no shortness of breath at  rest.  positve shortness of breath with exertion.  Denies history of atrial fibrillation or irregular heartbeat  Vascular: No history of rest pain in feet.  No history of claudication.  No history of non-healing ulcer, No history of DVT   Pulmonary: No home oxygen, no productive cough, no hemoptysis,  No asthma or wheezing  Musculoskeletal:  [ ]  Arthritis, [ ]  Low back pain,  [ ]  Joint pain  Hematologic:No history of hypercoagulable state.  No history of easy bleeding.  No history of anemia  Gastrointestinal: No hematochezia or melena,  No gastroesophageal reflux, no trouble swallowing  Urinary: [ ]  chronic Kidney disease, [ ]  on HD - [ ]  MWF or [ ]  TTHS, [ ]  Burning with urination, [ ]  Frequent urination, [ ]  Difficulty urinating;   Skin: No rashes  Psychological: No history of anxiety,  No history of depression  Physical Examination  Vitals:   11/25/20 1419  BP: (!) 157/82  Pulse: 82  Resp: 16  Temp: 98.3 F (36.8 C)  TempSrc: Temporal  SpO2: 97%  Weight: 213 lb 4.8 oz (96.8 kg)  Height: 5' 5"  (1.651 m)    Body mass index is 35.49 kg/m.  General:  Alert and oriented, no acute distress HEENT: Normal Neck: No bruit or JVD Pulmonary: Clear to auscultation bilaterally Cardiac: Regular Rate and Rhythm without murmur Abdomen: Soft, non-tender, non-distended, no mass, no scars Skin: No rash     Extremity Pulses:  2+ radial, brachial, femoral, dorsalis pedis, posterior tibial pulses bilaterally Musculoskeletal: No deformity or edema  Neurologic: Upper and lower extremity motor 5/5 and symmetric  DATA:    Venous Reflux Times  +------------------+---------+------+-----------+------------+--------+  RIGHT             Reflux NoRefluxReflux TimeDiameter cmsComments                              Yes                                   +------------------+---------+------+-----------+------------+--------+  CFV                         yes   >1 second                        +------------------+---------+------+-----------+------------+--------+  FV mid                      yes   >1 second                       +------------------+---------+------+-----------+------------+--------+  Popliteal         no                                              +------------------+---------+------+-----------+------------+--------+  GSV at SFJ                  yes    >500 ms      0.79              +------------------+---------+------+-----------+------------+--------+  GSV prox thigh                     >500 ms      0.68              +------------------+---------+------+-----------+------------+--------+  GSV mid thigh                      >500 ms  0.63              +------------------+---------+------+-----------+------------+--------+  GSV dist thigh                     >500 ms      0.69              +------------------+---------+------+-----------+------------+--------+  GSV at knee                        >500 ms      0.62              +------------------+---------+------+-----------+------------+--------+  GSV prox calf                      >500 ms      0.32              +------------------+---------+------+-----------+------------+--------+  SSV Pop Fossa     no                            0.25              +------------------+---------+------+-----------+------------+--------+  anterior accessory          yes    >500 ms      0.47              +------------------+---------+------+-----------+------------+--------+      +------------------+---------+------+-----------+------------+--------+  LEFT              Reflux NoRefluxReflux TimeDiameter cmsComments                              Yes                                   +------------------+---------+------+-----------+------------+--------+  CFV               no                                               +------------------+---------+------+-----------+------------+--------+  FV mid                      yes   >1 second                       +------------------+---------+------+-----------+------------+--------+  Popliteal         no                                              +------------------+---------+------+-----------+------------+--------+  GSV at [facility]        no                            0.87              +------------------+---------+------+-----------+------------+--------+  GSV prox thigh    no                            0.41              +------------------+---------+------+-----------+------------+--------+  GSV mid thigh               yes    >500 ms      0.34              +------------------+---------+------+-----------+------------+--------+  GSV dist thigh              yes    >500 ms      0.32              +------------------+---------+------+-----------+------------+--------+  GSV at knee                                             NWV       +------------------+---------+------+-----------+------------+--------+  GSV prox calf                                           NWV       +------------------+---------+------+-----------+------------+--------+  SSV Pop Fossa     no                            0.22              +------------------+---------+------+-----------+------------+--------+  anterior accessoryno                            0.54              +------------------+---------+------+-----------+------------+--------+    Summary:  Right:  - No evidence of deep vein thrombosis from the common femoral through the  popliteal veins.  - No evidence of superficial venous thrombosis.  - The deep venous system is not competent.  - The great saphenous vein is not competent.  - The small saphenous vein is not competent.  - The anterior accessory vein is not competent.     Left:  - No evidence of deep  vein thrombosis from the common femoral through the  popliteal veins.  - No evidence of superficial venous thrombosis.  - The femoral vein is not competent.  - The great saphenous vein is not competent.  - The small saphenous vein is competent.  - The anterior accessory vein is competent.   Assessment: Venous insufficieny with SFJ and GSV incompetency right > left mixed deep and superficial reflux.   Left LE does not have SFJ reflux.   Her skin has a brawny discoloration with very tight edema B LE.  She has palpable pedal pulses B and no history of non healing wounds.  She states elevation does help.   Plan:We measured her for thigh high compression to be worn daily and elevation when at rest.  Water therapy and continued daily exercise.  I will have her f/u with our Vein specialist in 3 months to be considered for intervention.    Roxy Horseman PA-C [facility] [place] Office: [phone]  MD in clinic [place]

## 2020-12-01 ENCOUNTER — Encounter: Payer: Self-pay | Admitting: Nurse Practitioner

## 2020-12-01 ENCOUNTER — Other Ambulatory Visit (INDEPENDENT_AMBULATORY_CARE_PROVIDER_SITE_OTHER): Payer: Medicare Other

## 2020-12-01 ENCOUNTER — Ambulatory Visit (INDEPENDENT_AMBULATORY_CARE_PROVIDER_SITE_OTHER): Payer: Medicare Other | Admitting: Nurse Practitioner

## 2020-12-01 VITALS — BP 140/80 | HR 78 | Ht 65.0 in | Wt 213.4 lb

## 2020-12-01 DIAGNOSIS — K7460 Unspecified cirrhosis of liver: Secondary | ICD-10-CM

## 2020-12-01 DIAGNOSIS — R601 Generalized edema: Secondary | ICD-10-CM

## 2020-12-01 DIAGNOSIS — R188 Other ascites: Secondary | ICD-10-CM | POA: Diagnosis not present

## 2020-12-01 DIAGNOSIS — R6889 Other general symptoms and signs: Secondary | ICD-10-CM

## 2020-12-01 LAB — CBC WITH DIFFERENTIAL/PLATELET
Basophils Absolute: 0 10*3/uL (ref 0.0–0.1)
Basophils Relative: 0.2 % (ref 0.0–3.0)
Eosinophils Absolute: 0.3 10*3/uL (ref 0.0–0.7)
Eosinophils Relative: 5.3 % — ABNORMAL HIGH (ref 0.0–5.0)
HCT: 38.7 % (ref 36.0–46.0)
Hemoglobin: 13.2 g/dL (ref 12.0–15.0)
Lymphocytes Relative: 14.2 % (ref 12.0–46.0)
Lymphs Abs: 0.7 10*3/uL (ref 0.7–4.0)
MCHC: 34.2 g/dL (ref 30.0–36.0)
MCV: 104.7 fl — ABNORMAL HIGH (ref 78.0–100.0)
Monocytes Absolute: 0.5 10*3/uL (ref 0.1–1.0)
Monocytes Relative: 10.1 % (ref 3.0–12.0)
Neutro Abs: 3.4 10*3/uL (ref 1.4–7.7)
Neutrophils Relative %: 70.2 % (ref 43.0–77.0)
Platelets: 103 10*3/uL — ABNORMAL LOW (ref 150.0–400.0)
RBC: 3.69 Mil/uL — ABNORMAL LOW (ref 3.87–5.11)
RDW: 14.5 % (ref 11.5–15.5)
WBC: 4.9 10*3/uL (ref 4.0–10.5)

## 2020-12-01 LAB — BASIC METABOLIC PANEL
BUN: 7 mg/dL (ref 6–23)
CO2: 28 mEq/L (ref 19–32)
Calcium: 9.2 mg/dL (ref 8.4–10.5)
Chloride: 101 mEq/L (ref 96–112)
Creatinine, Ser: 0.52 mg/dL (ref 0.40–1.20)
GFR: 92.99 mL/min (ref >60.00)
Glucose, Bld: 98 mg/dL (ref 70–99)
Potassium: 3.8 mEq/L (ref 3.5–5.1)
Sodium: 137 mEq/L (ref 135–145)

## 2020-12-01 LAB — HEPATIC FUNCTION PANEL
ALT: 20 U/L (ref 0–35)
AST: 46 U/L — ABNORMAL HIGH (ref 0–37)
Albumin: 3.6 g/dL (ref 3.5–5.2)
Alkaline Phosphatase: 115 U/L (ref 39–117)
Bilirubin, Direct: 0.8 mg/dL — ABNORMAL HIGH (ref 0.0–0.3)
Total Bilirubin: 3.1 mg/dL — ABNORMAL HIGH (ref 0.2–1.2)
Total Protein: 6.6 g/dL (ref 6.0–8.3)

## 2020-12-01 LAB — PROTIME-INR
INR: 1.4 ratio — ABNORMAL HIGH (ref 0.8–1.0)
Prothrombin Time: 15.2 s — ABNORMAL HIGH (ref 9.6–13.1)

## 2020-12-01 LAB — FERRITIN: Ferritin: 481.5 ng/mL — ABNORMAL HIGH (ref 10.0–291.0)

## 2020-12-01 LAB — B12 AND FOLATE PANEL
Folate: >24.4 ng/mL (ref >5.9)
Vitamin B-12: 874 pg/mL (ref 211–911)

## 2020-12-01 LAB — IRON: Iron: 181 ug/dL — ABNORMAL HIGH (ref 42–145)

## 2020-12-01 NOTE — Patient Instructions (Addendum)
If you are age 72 or older, your body mass index should be between 23-30. Your Body mass index is 35.51 kg/m. If this is out of the aforementioned range listed, please consider follow up with your Primary Care Provider.  The [facility] providers would like to encourage you to use [facility] to communicate with providers for non-urgent requests or questions.  Due to long hold times on the telephone, sending your provider a message by [facility] may be faster and more efficient way to get a response. Please allow 48 business hours for a response.  Please remember that this is for non-urgent requests/questions.   LABS:  Lab work has been ordered for you today. Our lab is located in the basement. Press "B" on the elevator. The lab is located at the first door on the left as you exit the elevator.  HEALTHCARE LAWS AND MY CHART RESULTS: Due to recent changes in healthcare laws, you may see the results of your imaging and laboratory studies on MyChart before your provider has had a chance to review them.   We understand that in some cases there may be results that are confusing or concerning to you. Not all laboratory results come back in the same time frame and the provider may be waiting for multiple results in order to interpret others.  Please give [place] 48 hours in order for your provider to thoroughly review all the results before contacting the office for clarification of your results.   IMAGING: You will be contacted by [place] (Your caller ID will indicate phone # [phone]) in the next 2 days to schedule your Complete Abdominal Ultrasound. If you have not heard from them within 2 business days, please call [facility] at [phone] to follow up on the status of your appointment.    PROCEDURES: You have been scheduled for a EGD. Please follow the written instructions given to you at your visit today. If you use inhalers (even only as needed), please  bring them with you on the day of your procedure.  RECOMMENDATIONS: Please follow a 2 gram low sodium diet. Continue same dose Lasix and Spironolactone for now. Go the the emergency room if you develop worsening shortness of breath. Please Avoid all NSAID's. Some examples of NSAID's are as follows: Aspirin (Bufferin, Bayer, and Excedrin) Ibuprofen (Advil, Motrin, Nuprin) Ketoprofen (Actron, Orudis) Naproxen (Aleve) Daypro  Indocin  Lodine  Naprosyn  Relafen  Vimovo Voltaren   It was great seeing you today! Thank you for entrusting me with your care and choosing [facility].  Noralyn Pick, CRNP

## 2020-12-01 NOTE — Progress Notes (Signed)
12/01/2020 Selena Johnson [id number] [birth year]   CHIEF COMPLAINT: Fatty liver   HISTORY OF PRESENT ILLNESS:  Selena Johnson is a 70 year old female with a past medical history of hypertension, hypothyroidism, breast cancer 07/2018 s/p left breast lumpectomy and radiation on Arimidex,GERD and cirrhosis (NASH suspected).  Past cholecystectomy, partial abdominal hysterectomy and tonsillectomy.   She presents to our office today as referred by Dr. Glori Bickers for further evaluation regarding cirrhosis.  She was initially seen in our office by Dr. Deatra Ina 10/25/2012 for evaluation regarding a CT scan which showed evidence of cirrhosis and a 12 mm hyperattenuating focus in the lateral segment of the left liver. Elevated AFP level 11.7. She underwent an EGD to survey for varices on 10/26/2012 which identified trace esophageal varices and gastritis. A repeat EGD in one year was recommended but was not done. Hepatitis A, B and C serologies were negative. She received Hep A and B vaccinations.  Never had a liver biopsy. She apparently became lost to follow-up and had routine laboratory studies by her PCP along with her annual physical exams.  She was referred to our office 07/2015 by Dr. Glori Bickers for colon cancer screening. She underwent a screening colonoscopy by Dr. Silverio Decamp 04/15/2016 which showed 2 small tubular adenomatous polyps which were removed from the ascending colon and cecum. Recall colonoscopy in 5 years was recommended.   She presented to [facility] ED on 10/21/2020 with abdominal swelling and severe lower extremity edema  with weeping blisters and SOB which had progressively worsened over the past 2 weeks.  Labs in the ED showed elevated BMP 195. Elevated troponin x 2. D-dimer 15.88. Covid 19 test was negative. Na+ 138. K 3.3. Alk phos 105. T. Bili 2.8. AST 49. ALT 22. Albumin 3.4. WBC 4.4. Hg 13.8. MCV 104.1. PLT 96. She was admitted for further evaluation for CHF. She received IV Lasix and  was diuresed 1.5 L. She was seen by cardiologist Dr. Skeet Latch who did non think heart failure was contributing to her LE edema and was most likely due to venous stasis. An ECHO showed LV EF 55% with normal RV function. LE doppler study was negative for DVT. A right upper quadrant sonogram showed diffusely decreased echogenicity of the liver parenchyma, patent portal vein and ascites in the RUQ. No focal liver lesions were identified. She was discharged home 10/23/2020 on Furosemide 15m QD and Spironolactone 268mQD. She was seen by Dr. ToGlori Bickers/24/202 for follow up and she was referred GI and to a vascular specialist.   Currently, she continues to have shortness of breath with exertion which is chronic.  She denies having any chest pain, palpitations or dizziness.  She remains on Furosemide 40 mg once daily and Spironolactone 25 mg once daily.  Her abdominal swelling has decreased since she was admitted to the hospital and her leg swelling has improved slightly, has not worsened.  She stated her significant leg swelling started in March 2022 which she thought was due to having hypothyroidism.  She does not recall being diagnosed with cirrhosis in 2014 but acknowledges she has a fatty liver. She denies ever seeing a hepatologist. Rare alcohol use. Weight has been stable at 212 - 213lbs since her 5/18 hospital admission.  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   EGD 10/26/2012: Trace esophageal varices  Gastritis Repeat surveillance EGD  in 1 year   Colonoscopy 04/15/2016: - Erythematous mucosa in the cecum. Biopsied. - The examined portion of the ileum was normal. Biopsied. - Biopsies were taken with a cold forceps for histology in the rectum. - One 2 mm polyp in the cecum, removed with a cold biopsy forceps. Resected and retrieved - One 4 mm polyp in the ascending colon, removed with a  cold snare. Resected and retrieved - 5 year recall colonoscopy  1. Surgical [P], cecum - BENIGN COLONIC MUCOSA WITH VASCULAR CONGESTION. - NO ACTIVE INFLAMMATION. - NO DYSPLASIA OR MALIGNANCY. 2. Surgical [P], cecum, ascending, polyp (2) - TUBULAR ADENOMA (X2 FRAGMENTS). - NO HIGH GRADE DYSPLASIA OR MALIGNANCY. 3. Surgical [P], terminal ileum - BENIGN SMALL BOWEL MUCOSA. - NO ACTIVE INFLAMMATION. - NO DYSPLASIA OR MALIGNANCY. 4. Surgical [P], rectum - BENIGN COLONIC MUCOSA. - NO ACTIVE INFLAMMATION. - NO DYSPLASIA OR MALIGNANCY.  ECHO 10/22/2020: 1. Left ventricular ejection fraction, by estimation, is 55 to 60%. The left ventricle has normal function. The left ventricle has no regional wall motion abnormalities. Left ventricular diastolic parameters were normal. 2. Right ventricular systolic function is normal. The right ventricular size is normal. 3. Left atrial size was moderately dilated. 4. The mitral valve is normal in structure. Trivial mitral valve regurgitation. No evidence of mitral stenosis. 5. The aortic valve is tricuspid. Aortic valve regurgitation is not visualized. No aortic stenosis is present.  CTAP 10/19/2012: ABDOMEN AND PELVIS WITHOUT CONTRAST   Technique:  Multidetector CT imaging of the abdomen and pelvis was  performed following the standard protocol without intravenous  contrast.   Comparison: No comparison studies available.   Findings: Subsegmental atelectasis is seen in the lung bases.   The liver measures 15.3 cm in cranial caudal length.  Irregular /  nodular liver contour raises the question of cirrhosis.  Subtle 12  mm hyperattenuating lesion in the lateral segment left liver is  seen on image 38.  Spleen size is upper normal at 13.6 cm and  cranial caudal length.   The stomach, duodenum, pancreas, and adrenal glands are  unremarkable.  The gallbladder is surgically absent.   Small lymph nodes are seen in the gastrohepatic and  hepatoduodenal  ligaments.  Both kidneys demonstrate a duplicated intrarenal  collecting systems and proximal ureters.  No hydronephrosis or  focal mass lesion visualized in either kidney.   No abdominal aortic aneurysm.  Mild stranding in the celiac axis is  nonspecific.   Imaging through the pelvis shows no free intraperitoneal fluid.  No  pelvic sidewall lymphadenopathy.  Uterus is surgically absent.  There is no adnexal mass.  The terminal ileum and the appendix are  normal.   Bone windows reveal no worrisome lytic or sclerotic osseous  lesions.   IMPRESSION:  Changes in the liver suggests cirrhosis.  12 mm hyperattenuating  focus in the lateral segment of the left liver. If cirrhosis is  proven out, further evaluation of this focal lesion is recommended  as hepatoma could have this appearance.  Borderline splenomegaly.     Past Medical History:  Diagnosis Date   Allergy    Cancer ([place])    basal cell skin CA   Chronic kidney disease    hx of kidney stones   Conjunctivitis    chronic   Fatty liver 2015   GERD (gastroesophageal reflux disease)    Glaucoma    Hearing loss in left ear    History of kidney stones    Hyperlipidemia  no per pt   Hypertension    Menopausal syndrome    NASH (nonalcoholic steatohepatitis)    with cirrhosis and mild esoph varicies   Primary hypothyroidism    Past Surgical History:  Procedure Laterality Date   ABDOMINAL HYSTERECTOMY     partial ? prolapse   BREAST LUMPECTOMY WITH RADIOACTIVE SEED AND SENTINEL LYMPH NODE BIOPSY Left 07/12/2018   Procedure: LEFT BREAST LUMPECTOMY WITH RADIOACTIVE SEED AND LEFT AXILLARY SENTINEL LYMPH NODE BIOPSY;  Surgeon: Rolm Bookbinder, MD;  Location: [facility];  Service: General;  Laterality: Left;   CHOLECYSTECTOMY     ESOPHAGOGASTRODUODENOSCOPY     TONSILLECTOMY      Social History: She is married. Retired. Smoked cigarettes, quit smoking in the 1980's. She drinks one glass of wine or one  margarita, infrequent last 06/2020.   Family History: Father with history of lung cancer.  Mother deceased at the age of 39 secondary to sepsis.  Brother with history of renal cell carcinoma.  No known family history of esophageal, gastric, colon, liver or pancreatic cancer.  Current Outpatient Medications on File Prior to Visit  Medication Sig Dispense Refill   anastrozole (ARIMIDEX) 1 MG tablet Take 1 tablet by mouth daily (Patient taking differently: Take 1 mg by mouth daily.) 90 tablet 3   Biotin 2500 MCG CAPS Take 5,000 mcg by mouth daily.     Cholecalciferol (VITAMIN D3) 2000 units TABS Take 2,000 Units by mouth daily.     furosemide (LASIX) 40 MG tablet Take 1 tablet (40 mg total) by mouth daily. 30 tablet 1   levothyroxine (SYNTHROID) 200 MCG tablet Take 1 tablet (200 mcg total) by mouth daily before breakfast. 15 tablet 0   loratadine (CLARITIN) 10 MG tablet Take 10 mg by mouth daily as needed.     metoprolol succinate (TOPROL-XL) 100 MG 24 hr tablet Take 1 by mouth daily with or after a meal (Patient taking differently: Take 100 mg by mouth daily.) 90 tablet 3   Multiple Vitamin (MULTIVITAMIN) capsule Take 1 capsule by mouth daily.     potassium chloride SA (KLOR-CON) 20 MEQ tablet Take 1 tablet by mouth every day (Patient taking differently: Take 20 mEq by mouth daily.) 90 tablet 3   spironolactone (ALDACTONE) 25 MG tablet Take 1 tablet (25 mg total) by mouth daily. 30 tablet 1   No current facility-administered medications on file prior to visit.    Allergies  Allergen Reactions   Codeine     REACTION: Throat swelling   Ivp Dye [Iodinated Diagnostic Agents] Swelling     REVIEW OF SYSTEMS:  Gen: + Fatigue. Denies fever, sweats or chills. Weight gain.  CV: See HPI.  Resp: + SOB and cough. No hemoptysis.  GI: See HPI.  GU : Denies urinary burning, blood in urine, increased urinary frequency or incontinence. MS: Denies joint pain, muscles aches or weakness. Derm: Legs with  swelling and weeping.  Psych: + Anxiety and depression.  Heme: Denies bruising, bleeding. Neuro:  Denies headaches, dizziness or paresthesias. Endo:  Denies any problems with DM, thyroid or adrenal function.  PHYSICAL EXAM: BP 140/80   Pulse 78   Ht 5' 5"  (1.651 m)   Wt 213 lb 6.4 oz (96.8 kg)   SpO2 90%   BMI 35.51 kg/m   Wt Readings from Last 3 Encounters:  12/01/20 213 lb 6.4 oz (96.8 kg)  11/25/20 213 lb 4.8 oz (96.8 kg)  10/27/20 212 lb 6.4 oz (96.3 kg)    General: 70  year old female in no acute distress. Head: Normocephalic and atraumatic. Eyes:  Sclerae non-icteric, conjunctive pink. Ears: Normal auditory acuity. Mouth: Dentition intact. No ulcers or lesions.  Neck: Supple, no lymphadenopathy or thyromegaly.  Lungs: Clear bilaterally to auscultation without wheezes, crackles or rhonchi. Heart: Regular rate and rhythm. No murmur, rub or gallop appreciated.  Abdomen: Distended abdomen with anasarca component with ascites, challenging to assess for hepatosplenomegaly secondary to obese distended abdomen. Abdomen is not tense. Normoactive bowel sounds x 4 quadrants.  Rectal: Deferred.  Musculoskeletal: Symmetrical with no gross deformities. Skin: Warm and dry. No rash or lesions on visible extremities. No jaundice.  Extremities: Anasarca to the lower abdomen -> bilateral thighs -> ankles. Stasis dermatitis component to bilateral lower extremities.  Neurological: Alert oriented x 4. No asterixis.  Psychological:  Alert and cooperative. Normal mood and affect.  ASSESSMENT AND PLAN:  90. 70 year old female with decompensated cirrhosis (suspect NASH) with ascites and trace esophageal varices (per EGD 2014) admitted to the hospital 5/18 - 10/23/2020 with fluid overload. See HPI. -Complete abdominal sonogram stat to evaluate the liver, spleen and to assess if enough ascites for a paracentesis  -Eventual CTAP vs abdominal MRI -CBC, BMP, hepatic panel, PT/INR, AFP, hepatitis B  surface antigen, hepatitis B surface antibody, hepatitis B core total antibody, hepatitis A total antibody, hepatitis C antibody, ANA, SMA, AMA, IgG, iron and ferritin level.  -Schedule EGD to survey for esophageal and gastric varices  -If EGD identifies esophageal varices, Metoprolol will need to be changed to a nonselective beta-blocker i.e. Nadolol or Carvedilol  -Continue Furosemide 40 mg p.o. daily and Spironolactone 25 mg p.o. daily, will increase diuretic dosages after the above lab results reviewed -No NSAIDS -2 g low-sodium diet  2. Anasarca secondary to cirrhosis, hypoalbuminemia (albumin level 2.9 on 10/22/2020), query underlying malignant process. ECHO with LV EF 55 % with normal RV function.  -Check urine protein level to rule out nephrotic syndrome (patient will be contacted to have done, I did not order at time of her lab draw this am) -Eventual chest/abd/pelvic CT  3. History of breast cancer 07/2018 s/p left breast lumpectomy and radiation (no IV chemo) on Arimidex.   4. History of tubular adenomatous colon polyps per colonoscopy 04/2016 -Next colonoscopy due 04/2021  5. Thrombocytopenia, suspect splenomegaly. PLT 82.  6. Macrocytic anemia. Hg 14. -> 11.6 and MCV  103.1 on 10/22/2020. No overt GI bleeding.  7. Remote history of GERD  Further recommendations to be completed after the above evaluation completed  Today's encounter was 55 minutes which included pre-charting, extensive chart review ie lab results, image studies and past procedure reports,  history/physical exam, face-to-face time used for counseling, formulating a treatment plan/follow-up and documentation.           CC:  Tower, [name], MD

## 2020-12-02 ENCOUNTER — Ambulatory Visit (HOSPITAL_COMMUNITY)
Admission: RE | Admit: 2020-12-02 | Discharge: 2020-12-02 | Disposition: A | Discharge status: Home / Self Care | Payer: Medicare Other | Source: Ambulatory Visit | Attending: Nurse Practitioner | Admitting: Nurse Practitioner

## 2020-12-02 ENCOUNTER — Other Ambulatory Visit: Payer: Self-pay

## 2020-12-02 DIAGNOSIS — K7460 Unspecified cirrhosis of liver: Secondary | ICD-10-CM

## 2020-12-02 DIAGNOSIS — R161 Splenomegaly, not elsewhere classified: Secondary | ICD-10-CM | POA: Diagnosis not present

## 2020-12-02 DIAGNOSIS — R188 Other ascites: Secondary | ICD-10-CM | POA: Insufficient documentation

## 2020-12-02 DIAGNOSIS — R601 Generalized edema: Secondary | ICD-10-CM | POA: Diagnosis not present

## 2020-12-04 ENCOUNTER — Other Ambulatory Visit: Payer: Self-pay

## 2020-12-04 ENCOUNTER — Telehealth: Payer: Self-pay

## 2020-12-04 DIAGNOSIS — K7460 Unspecified cirrhosis of liver: Secondary | ICD-10-CM

## 2020-12-04 DIAGNOSIS — R188 Other ascites: Secondary | ICD-10-CM

## 2020-12-04 DIAGNOSIS — R7989 Other specified abnormal findings of blood chemistry: Secondary | ICD-10-CM

## 2020-12-04 DIAGNOSIS — R809 Proteinuria, unspecified: Secondary | ICD-10-CM

## 2020-12-04 NOTE — Telephone Encounter (Signed)
Patient called back. We reviewed the labs and the plan of care. Briefly explained what a paracentesis is and what the albumin is for. She will be at the [facility] Radiology dept at 12:45 pm. Wants to get her labs after her EGD on 12/08/20 due to transportation. (Less than 24 hours apart from IR Para) Explained the labs briefly.  Explained the medication change using the teach back method. Questions invited and answered. She will call when she is ready for the refill on Aldactone.

## 2020-12-04 NOTE — Telephone Encounter (Signed)
Left a message for the patient to call me back today before 5 pm. 1) needs additional labs to be done on 12/09/20 2)medication change 3)paracentesis 12/09/20 at [facility] Radiology in the [place].

## 2020-12-04 NOTE — Telephone Encounter (Signed)
-----   Message from Noralyn Pick, NP sent at 12/04/2020  6:30 AM EDT ----- I reviewed bun/cr level and diuretic plan with Dr. Carlean Purl.  Beth a lot to do for this patient.  1.  Pls contact the patient and instruct her to continue Furosemide 50m QD and increase Spironolactone from 237mto 5021mend in Rx for Spironolactone 66m50mb two tabs po QD # 60, 1 refill. Ok to refill same dose of Furosemide  for 30 days if she needs it.   2. Send her to the lab to have a urinalysis (check urine protein), CMP and hemochromatosis mutation C282y and H63D (iron and ferritin levels were elevated) on the same day of her paracentesis (hopefully will be done next week). I was  going to wait 2 weeks to repeat bun/cr but will do the CMP day of paracentesis so I can calculate SAAG.  3. Schedule her for a paracentesis to remove no more than 1L of peritoneal fluid with the administration of Albumin 25gm IV (not always necessary when removing only 1L but she has so much edema/anasarca I would give it). Peritoneal fluid labs to include cell count with diff, gram stain, albumin, protein, aerobic and anaerobic cultures and cytology. Paracentesis should not be on same date of EGD which is scheduled 7/5 with Dr GessCarlean Purl

## 2020-12-08 ENCOUNTER — Encounter: Payer: Self-pay | Admitting: Internal Medicine

## 2020-12-08 ENCOUNTER — Other Ambulatory Visit: Payer: Self-pay

## 2020-12-08 ENCOUNTER — Ambulatory Visit (AMBULATORY_SURGERY_CENTER): Payer: Medicare Other | Admitting: Internal Medicine

## 2020-12-08 ENCOUNTER — Other Ambulatory Visit (INDEPENDENT_AMBULATORY_CARE_PROVIDER_SITE_OTHER): Payer: Medicare Other

## 2020-12-08 VITALS — BP 127/58 | HR 73 | Temp 97.8°F | Resp 21 | Ht 65.0 in | Wt 213.0 lb

## 2020-12-08 DIAGNOSIS — R7989 Other specified abnormal findings of blood chemistry: Secondary | ICD-10-CM | POA: Diagnosis not present

## 2020-12-08 DIAGNOSIS — R809 Proteinuria, unspecified: Secondary | ICD-10-CM | POA: Diagnosis not present

## 2020-12-08 DIAGNOSIS — R188 Other ascites: Secondary | ICD-10-CM

## 2020-12-08 DIAGNOSIS — I8510 Secondary esophageal varices without bleeding: Secondary | ICD-10-CM | POA: Diagnosis not present

## 2020-12-08 DIAGNOSIS — K7460 Unspecified cirrhosis of liver: Secondary | ICD-10-CM

## 2020-12-08 LAB — HEPATITIS C ANTIBODY
Hepatitis C Ab: NONREACTIVE
SIGNAL TO CUT-OFF: 0.01 (ref <1.00)

## 2020-12-08 LAB — COMPREHENSIVE METABOLIC PANEL
ALT: 20 U/L (ref 0–35)
AST: 45 U/L — ABNORMAL HIGH (ref 0–37)
Albumin: 3.6 g/dL (ref 3.5–5.2)
Alkaline Phosphatase: 113 U/L (ref 39–117)
BUN: 6 mg/dL (ref 6–23)
CO2: 29 mEq/L (ref 19–32)
Calcium: 9.2 mg/dL (ref 8.4–10.5)
Chloride: 101 mEq/L (ref 96–112)
Creatinine, Ser: 0.55 mg/dL (ref 0.40–1.20)
GFR: 91.73 mL/min (ref >60.00)
Glucose, Bld: 100 mg/dL — ABNORMAL HIGH (ref 70–99)
Potassium: 3.8 mEq/L (ref 3.5–5.1)
Sodium: 138 mEq/L (ref 135–145)
Total Bilirubin: 2.9 mg/dL — ABNORMAL HIGH (ref 0.2–1.2)
Total Protein: 6.7 g/dL (ref 6.0–8.3)

## 2020-12-08 LAB — ALPHA-1-ANTITRYPSIN: A-1 Antitrypsin, Ser: 168 mg/dL (ref 83–199)

## 2020-12-08 LAB — HEPATITIS B SURFACE ANTIBODY,QUALITATIVE: Hep B S Ab: REACTIVE — AB

## 2020-12-08 LAB — ANA: Anti Nuclear Antibody (ANA): NEGATIVE

## 2020-12-08 LAB — URINALYSIS, ROUTINE W REFLEX MICROSCOPIC
Bilirubin Urine: NEGATIVE
Hgb urine dipstick: NEGATIVE
Ketones, ur: NEGATIVE
Nitrite: NEGATIVE
RBC / HPF: NONE SEEN (ref 0–?)
Specific Gravity, Urine: 1.01 (ref 1.000–1.030)
Total Protein, Urine: NEGATIVE
Urine Glucose: NEGATIVE
Urobilinogen, UA: 1 (ref 0.0–1.0)
pH: 6 (ref 5.0–8.0)

## 2020-12-08 LAB — IGG: IgG (Immunoglobin G), Serum: 1342 mg/dL (ref 600–1540)

## 2020-12-08 LAB — AFP TUMOR MARKER: AFP-Tumor Marker: 5.2 ng/mL

## 2020-12-08 LAB — ANTI-SMOOTH MUSCLE ANTIBODY, IGG: Actin (Smooth Muscle) Antibody (IGG): <20 U (ref <20)

## 2020-12-08 LAB — HEPATITIS B SURFACE ANTIGEN: Hepatitis B Surface Ag: NONREACTIVE

## 2020-12-08 LAB — HEPATITIS B CORE ANTIBODY, TOTAL: Hep B Core Total Ab: NONREACTIVE

## 2020-12-08 LAB — HEPATITIS A ANTIBODY, TOTAL: Hepatitis A AB,Total: BORDERLINE — AB

## 2020-12-08 LAB — MITOCHONDRIAL ANTIBODIES: Mitochondrial M2 Ab, IgG: <=20 U

## 2020-12-08 MED ORDER — SODIUM CHLORIDE 0.9 % IV SOLN: 500.0000 mL | Freq: Once | INTRAVENOUS | Status: DC

## 2020-12-08 NOTE — Progress Notes (Signed)
PT taken to PACU. Monitors in place. VSS. Report given to RN. 

## 2020-12-08 NOTE — Op Note (Signed)
[facility] Patient Name: Selena Johnson Procedure Date: 12/08/2020 12:26 PM MRN: [id number] Endoscopist: Gatha Mayer , MD Age: 70 Referring MD:  Date of Birth: Dec 15, 1950 Gender: Female Account #: [id number] Procedure:                Upper GI endoscopy Indications:              Cirrhosis rule out esophageal varices Medicines:                Propofol per Anesthesia, Monitored Anesthesia Care Procedure:                Pre-Anesthesia Assessment:                           - Prior to the procedure, a History and Physical                            was performed, and patient medications and                            allergies were reviewed. The patient's tolerance of                            previous anesthesia was also reviewed. The risks                            and benefits of the procedure and the sedation                            options and risks were discussed with the patient.                            All questions were answered, and informed consent                            was obtained. Prior Anticoagulants: The patient has                            taken no previous anticoagulant or antiplatelet                            agents. ASA Grade Assessment: II - A patient with                            mild systemic disease. After reviewing the risks                            and benefits, the patient was deemed in                            satisfactory condition to undergo the procedure.                           After obtaining informed consent, the endoscope was  passed under direct vision. Throughout the                            procedure, the patient's blood pressure, pulse, and                            oxygen saturations were monitored continuously. The                            Endoscope was introduced through the mouth, and                            advanced to the second part of duodenum. The upper                             GI endoscopy was accomplished without difficulty.                            The patient tolerated the procedure well. Scope In: Scope Out: Findings:                 Grade II, small (< 5 mm) varices were found in the                            mid esophagus and in the distal esophagus. They                            were 4 mm in largest diameter.                           Mild portal hypertensive gastropathy was found in                            the entire examined stomach.                           The exam was otherwise without abnormality.                           The cardia and gastric fundus were normal on                            retroflexion. Complications:            No immediate complications. Estimated Blood Loss:     Estimated blood loss: none. Impression:               - Grade II and small (< 5 mm) esophageal varices.                           - Portal hypertensive gastropathy.                           - The examination was otherwise normal.                           -  No specimens collected. Recommendation:           - Patient has a contact number available for                            emergencies. The signs and symptoms of potential                            delayed complications were discussed with the                            patient. Return to normal activities tomorrow.                            Written discharge instructions were provided to the                            patient.                           - Low sodium diet.                           - Continue present medications.                           - Repeat upper endoscopy in 1 year for surveillance.                           - Return to GI clinic in 2 months. Gatha Mayer, MD 12/08/2020 1:51:59 PM This report has been signed electronically.

## 2020-12-08 NOTE — Patient Instructions (Addendum)
You do have esophageal varices and something called portal gastropathy. There is an increased risk of internal bleeding with these.  Please read the attached handout.  Two big points:  #1 Avoid heavy lifting and straining such that you bear down - this may precipitate bleeding  #2 If you vomit blood or pass black tarry stools call an ambulance and go to the emergency department.  We will get you a follow-up appointment with me for 2 months from now.,  I appreciate the opportunity to care for you. Gatha Mayer, MD, FACG  YOU HAD AN ENDOSCOPIC PROCEDURE TODAY AT [facility]:   Refer to the procedure report that was given to you for any specific questions about what was found during the examination.  If the procedure report does not answer your questions, please call your gastroenterologist to clarify.  If you requested that your care partner not be given the details of your procedure findings, then the procedure report has been included in a sealed envelope for you to review at your convenience later.  YOU SHOULD EXPECT: Some feelings of bloating in the abdomen. Passage of more gas than usual.  Walking can help get rid of the air that was put into your GI tract during the procedure and reduce the bloating. If you had a lower endoscopy (such as a colonoscopy or flexible sigmoidoscopy) you may notice spotting of blood in your stool or on the toilet paper. If you underwent a bowel prep for your procedure, you may not have a normal bowel movement for a few days.  Please Note:  You might notice some irritation and congestion in your nose or some drainage.  This is from the oxygen used during your procedure.  There is no need for concern and it should clear up in a day or so.  SYMPTOMS TO REPORT IMMEDIATELY:  Following upper endoscopy (EGD)  Vomiting of blood or coffee ground material  New chest pain or pain under the shoulder blades  Painful or persistently difficult  swallowing  New shortness of breath  Fever of 100F or higher  Black, tarry-looking stools  For urgent or emergent issues, a gastroenterologist can be reached at any hour by calling [phone]. Do not use MyChart messaging for urgent concerns.    DIET:  We do recommend a small meal at first, but then you may proceed to your regular diet.  Drink plenty of fluids but you should avoid alcoholic beverages for 24 hours.  ACTIVITY:  You should plan to take it easy for the rest of today and you should NOT DRIVE or use heavy machinery until tomorrow (because of the sedation medicines used during the test).    FOLLOW UP: Our staff will call the number listed on your records 48-72 hours following your procedure to check on you and address any questions or concerns that you may have regarding the information given to you following your procedure. If we do not reach you, we will leave a message.  We will attempt to reach you two times.  During this call, we will ask if you have developed any symptoms of COVID 19. If you develop any symptoms (ie: fever, flu-like symptoms, shortness of breath, cough etc.) before then, please call [phone].  If you test positive for Covid 19 in the 2 weeks post procedure, please call and report this information to [place].    If any biopsies were taken you will be contacted by phone or by letter within  the next 1-3 weeks.  Please call us at [phone] if you have not heard about the biopsies in 3 weeks.    SIGNATURES/CONFIDENTIALITY: You and/or your care partner have signed paperwork which will be entered into your electronic medical record.  These signatures attest to the fact that that the information above on your After Visit Summary has been reviewed and is understood.  Full responsibility of the confidentiality of this discharge information lies with you and/or your care-partner.

## 2020-12-08 NOTE — Progress Notes (Signed)
Pt's states no medical or surgical changes since previsit or office visit. 

## 2020-12-09 ENCOUNTER — Ambulatory Visit (HOSPITAL_COMMUNITY)
Admission: RE | Admit: 2020-12-09 | Discharge: 2020-12-09 | Disposition: A | Discharge status: Home / Self Care | Payer: Medicare Other | Source: Ambulatory Visit | Attending: Internal Medicine | Admitting: Internal Medicine

## 2020-12-09 DIAGNOSIS — R188 Other ascites: Secondary | ICD-10-CM | POA: Diagnosis not present

## 2020-12-09 DIAGNOSIS — K7460 Unspecified cirrhosis of liver: Secondary | ICD-10-CM

## 2020-12-09 DIAGNOSIS — K7581 Nonalcoholic steatohepatitis (NASH): Secondary | ICD-10-CM | POA: Diagnosis not present

## 2020-12-09 DIAGNOSIS — R809 Proteinuria, unspecified: Secondary | ICD-10-CM

## 2020-12-09 HISTORY — PX: IR PARACENTESIS: IMG2679

## 2020-12-09 LAB — BODY FLUID CELL COUNT WITH DIFFERENTIAL
Eos, Fluid: 0 %
Lymphs, Fluid: 70 %
Monocyte-Macrophage-Serous Fluid: 22 % — ABNORMAL LOW (ref 50–90)
Neutrophil Count, Fluid: 8 % (ref 0–25)
Total Nucleated Cell Count, Fluid: 151 cu mm (ref 0–1000)

## 2020-12-09 LAB — ALBUMIN, PLEURAL OR PERITONEAL FLUID: Albumin, Fluid: <1 g/dL

## 2020-12-09 LAB — PROTEIN, PLEURAL OR PERITONEAL FLUID: Total protein, fluid: <3 g/dL

## 2020-12-09 MED ORDER — ALBUMIN HUMAN 25 % IV SOLN
INTRAVENOUS | Status: AC
  Filled 2020-12-09: qty 100

## 2020-12-09 MED ORDER — ALBUMIN HUMAN 25 % IV SOLN
25.0000 g | Freq: Once | INTRAVENOUS | Status: AC
  Administered 2020-12-09: 25 g via INTRAVENOUS
  Filled 2020-12-09: qty 100

## 2020-12-09 MED ORDER — LIDOCAINE HCL 1 % IJ SOLN
INTRAMUSCULAR | Status: DC | PRN
  Administered 2020-12-09: 10 mL

## 2020-12-09 MED ORDER — LIDOCAINE HCL 1 % IJ SOLN
INTRAMUSCULAR | Status: AC
  Filled 2020-12-09: qty 20

## 2020-12-09 NOTE — Procedures (Signed)
PROCEDURE SUMMARY:  Successful US guided paracentesis from right lateral abdomen.  Yielded 2.6 liters of hazy yellow fluid.  No immediate complications.  Patient tolerated well.  EBL = trace  Specimen was sent for labs.  Judie Grieve Isiac Breighner PA-C 12/09/2020 4:43 PM

## 2020-12-10 ENCOUNTER — Telehealth: Payer: Self-pay

## 2020-12-10 ENCOUNTER — Other Ambulatory Visit: Payer: Self-pay

## 2020-12-10 LAB — CYTOLOGY - NON PAP

## 2020-12-10 LAB — GRAM STAIN

## 2020-12-10 MED ORDER — FUROSEMIDE 40 MG PO TABS: 40.0000 mg | ORAL_TABLET | Freq: Every day | ORAL | 1 refills | Status: DC

## 2020-12-10 MED ORDER — SPIRONOLACTONE 25 MG PO TABS: 25.0000 mg | ORAL_TABLET | Freq: Two times a day (BID) | ORAL | 1 refills | Status: DC

## 2020-12-10 NOTE — Telephone Encounter (Signed)
  LAST APPOINTMENT DATE: 11/11/2020   NEXT APPOINTMENT DATE:@Visit  date not found  MEDICATION:spironolactone (ALDACTONE) 25 MG tablet // furosemide (LASIX) 40 MG tablet   PHARMACY: [id number] [place], [place] - [address]  COMMENTS: Patient states she was referred to [facility] and they changed the dosage to twice daily on the ALDACTONE and is going to be out over the weekend. Has 10 days left of the Lasix.

## 2020-12-10 NOTE — Addendum Note (Signed)
Addended by: Tammi Sou on: 12/10/2020 02:41 PM   Modules accepted: Orders

## 2020-12-10 NOTE — Addendum Note (Signed)
Addended by: Loura Pardon A on: 12/10/2020 04:35 PM   Modules accepted: Orders

## 2020-12-10 NOTE — Telephone Encounter (Signed)
See prev message and pt's COMMENTS: Patient states she was referred to [facility] and they changed the dosage to twice daily on the ALDACTONE and is going to be out over the weekend. Has 10 days left of the Lasix.    Looks like hospital filled meds last, since dose change and hospital filled meds last will route to PCP for review

## 2020-12-10 NOTE — Telephone Encounter (Signed)
  Follow up Call-  Call back number 12/08/2020  Post procedure Call Back phone  # [phone]  Permission to leave phone message Yes  Some recent data might be hidden     Patient questions:  Do you have a fever, pain , or abdominal swelling? No. Pain Score  0 *  Have you tolerated food without any problems? Yes.    Have you been able to return to your normal activities? Yes.    Do you have any questions about your discharge instructions: Diet   No. Medications  Yes.   Follow up visit  No.  Do you have questions or concerns about your Care? No.  Actions: * If pain score is 4 or above: No action needed, pain <4.

## 2020-12-14 LAB — CULTURE, BODY FLUID W GRAM STAIN -BOTTLE: Culture: NO GROWTH

## 2020-12-16 LAB — HEMOCHROMATOSIS DNA-PCR(C282Y,H63D)

## 2020-12-21 ENCOUNTER — Encounter: Payer: Self-pay | Admitting: Emergency Medicine

## 2020-12-21 ENCOUNTER — Telehealth: Payer: Self-pay | Admitting: *Deleted

## 2020-12-21 ENCOUNTER — Other Ambulatory Visit: Payer: Self-pay

## 2020-12-21 ENCOUNTER — Ambulatory Visit
Admission: EM | Admit: 2020-12-21 | Discharge: 2020-12-21 | Disposition: A | Discharge status: Home / Self Care | Payer: Medicare Other

## 2020-12-21 DIAGNOSIS — R600 Localized edema: Secondary | ICD-10-CM

## 2020-12-21 DIAGNOSIS — I872 Venous insufficiency (chronic) (peripheral): Secondary | ICD-10-CM | POA: Diagnosis not present

## 2020-12-21 NOTE — Discharge Instructions (Addendum)
Return if any problems.  Elevate lower legs,  Take an extra dosage of lasix.  Go to the Emergency department if symptoms worsen

## 2020-12-21 NOTE — Telephone Encounter (Signed)
Patient called the office back and stated that the UC at [facility] has a 2 1/2 hours wait. Patient stated that she does not want to wait that long. Patient stated that the problem with her leg started 3 days ago and she has been busy taking care of her husband that is at [facility] and is terminal. Patient stated that she has to be back at the hospital in the morning to meet with her husband's doctor. Patient was given information on the UC at [facility]. Patient stated that she will head to the UC now.

## 2020-12-21 NOTE — Telephone Encounter (Signed)
PLEASE NOTE: All timestamps contained within this report are represented as [place] Standard Time. CONFIDENTIALTY NOTICE: This fax transmission is intended only for the addressee. It contains information that is legally privileged, confidential or otherwise protected from use or disclosure. If you are not the intended recipient, you are strictly prohibited from reviewing, disclosing, copying using or disseminating any of this information or taking any action in reliance on or regarding this information. If you have received this fax in error, please notify us immediately by telephone so that we can arrange for its return to [place]. Phone: [phone], Toll-Free: [phone], Fax: [phone] Page: 1 of 2 Call Id: [id number] [facility] - Client TELEPHONE ADVICE RECORD [facility] Patient Name: Selena Johnson LE Gender: Female DOB: August 02, 1950 Age: 70 Y 2 M 16 D Return Phone Number: [phone] (Primary), [phone] (Secondary) Address[place]  [address] Client [facility] - Client Client Site [facility], Roque Lias - MD Contact Type Call Who Is Calling Patient / Member / Family / Caregiver Call Type Triage / Clinical Relationship To Patient Self Return Phone Number [phone] (Secondary) Chief Complaint Leg Swelling And Edema Reason for Call Symptomatic / Request for Health Information Initial Comment Office is transferring pt with leg swelling with redness, weeping. office confirms no appts today - possibly tomorrow but not definite. Both legs swollen. Mentions vascular specialist. Keeps elevated with compression socks. One leg has blisters weeping. Cant even get the compression socks on. Translation No Nurse Assessment Nurse: Doyle Askew, RN, Beth Date/Time (Eastern Time): 12/21/2020 2:13:57 PM Confirm and document reason for call. If symptomatic, describe  symptoms. ---Office is transferring pt with bilateral leg swelling with redness, weeping. office confirms no appts today - possibly tomorrow but not definite. Caller saw nurse at vascular specialist; was told to keep legs elevated with compression socks. Caller stats one right leg has blisters weeping. Caller states she can't even get the compression socks on. Does the patient have any new or worsening symptoms? ---Yes Will a triage be completed? ---Yes Related visit to physician within the last 2 weeks? ---No Does the PT have any chronic conditions? (i.e. diabetes, asthma, this includes High risk factors for pregnancy, etc.) ---Yes List chronic conditions. ---thyroid, fatty liver Is this a behavioral health or substance abuse call? ---No Guidelines Guideline Title Affirmed Question Affirmed Notes Nurse Date/Time Eilene Ghazi Time) Leg Swelling and Edema SEVERE leg swelling (e.g., swelling extends Doyle Askew, RN, [facility] 12/21/2020 2:17:02 PM PLEASE NOTE: All timestamps contained within this report are represented as [place] Standard Time. CONFIDENTIALTY NOTICE: This fax transmission is intended only for the addressee. It contains information that is legally privileged, confidential or otherwise protected from use or disclosure. If you are not the intended recipient, you are strictly prohibited from reviewing, disclosing, copying using or disseminating any of this information or taking any action in reliance on or regarding this information. If you have received this fax in error, please notify us immediately by telephone so that we can arrange for its return to [place]. Phone: [phone], Toll-Free: [phone], Fax: [phone] Page: 2 of 2 Call Id: [id number] Guidelines Guideline Title Affirmed Question Affirmed Notes Nurse Date/Time Eilene Ghazi Time) above knee, entire leg is swollen, weeping fluid) Disp. Time Eilene Ghazi Time) Disposition Final User 12/21/2020 2:26:28 PM See HCP within 4  Hours (or PCP triage) Yes Doyle Askew, RN, Beth Caller Disagree/Comply Comply Caller Understands Yes PreDisposition Call Doctor Care Advice Given Per Guideline SEE  HCP (OR PCP TRIAGE) WITHIN 4 HOURS: * IF OFFICE WILL BE CLOSED AND NO PCP (PRIMARY CARE PROVIDER) SECOND-LEVEL TRIAGE: You need to be seen within the next 3 or 4 hours. A nearby Urgent Care Center [facility]) is often a good source of care. Another choice is to go to the ED. Go sooner if you become worse. * IF OFFICE WILL BE OPEN: You need to be seen within the next 3 or 4 hours. Call your doctor (or NP/PA) now or as soon as the office opens. CALL BACK IF: * You become worse CARE ADVICE given per Leg Swelling and Edema (Adult) guideline. Comments User: Melene Muller, RN Date/Time Eilene Ghazi Time): 12/21/2020 2:23:34 PM Tried to warm transfer - called backline 3 x with no answer Referrals REFERRED TO PCP OFFICE

## 2020-12-21 NOTE — Telephone Encounter (Signed)
Spoke to patient by telephone and was advised that she was told that she needed to be seen today.Patient stated that she is in [place] checking out a hospice place for her husband. Patient was advised that she is not too far from the [facility] UC next to the hospital. Patient stated that she will head to the UC now.

## 2020-12-21 NOTE — ED Triage Notes (Signed)
Pt here with bilateral leg swelling that is acute on chronic in nature. The right leg is weaping with a diffuse rash from the knee down and the rash is starting on the left side. Unable to wear compression socks and unable to currently elevate.

## 2020-12-23 NOTE — Telephone Encounter (Signed)
Aware, will watch for correspondence

## 2020-12-23 NOTE — ED Provider Notes (Signed)
[facility]    CSN: [id number] Arrival date & time: 12/21/20  1702      History   Chief Complaint Chief Complaint  Patient presents with   Leg Swelling    HPI Selena Johnson is a 70 y.o. female.   Pt complains of swelling and fluid oozing out of both legs.  Pt is on lasix at home.    The history is provided by the patient. No language interpreter was used.  Extremity Pain This is a new problem. The current episode started more than 1 week ago. The problem occurs constantly. The problem has been gradually worsening. Nothing relieves the symptoms. She has tried nothing for the symptoms. The treatment provided moderate relief.   Past Medical History:  Diagnosis Date   Allergy    Cancer ([place])    basal cell skin CA   Chronic kidney disease    hx of kidney stones   Conjunctivitis    chronic   Fatty liver 2015   GERD (gastroesophageal reflux disease)    Glaucoma    Hearing loss in left ear    History of kidney stones    Hyperlipidemia    no per pt   Hypertension    Menopausal syndrome    NASH (nonalcoholic steatohepatitis)    with cirrhosis and mild esoph varicies   Primary hypothyroidism     Patient Active Problem List   Diagnosis Date Noted   Venous insufficiency 10/27/2020   Volume overload 10/21/2020   Pedal edema 10/21/2020   Caregiver stress 09/07/2020   Secondary esophageal varices without bleeding ([place]) 09/07/2020   Malignant neoplasm of upper-inner quadrant of left breast in female, estrogen receptor positive ([place]) 06/27/2018   Left breast mass 06/17/2018   Elevated random blood glucose level 07/28/2017   Skin cancer screening 05/05/2017   Colon cancer screening 07/21/2015   Estrogen deficiency 07/21/2015   Calcification of right breast 05/06/2015   Encounter for Medicare annual wellness exam 07/20/2014   Unspecified constipation 10/25/2012   Hepatic cirrhosis ([place]) 10/23/2012   Hypokalemia 07/13/2012   Thrombocytopenia ([place]) 11/10/2010    Routine general medical examination at a health care facility 11/04/2010   Hyperlipidemia 03/18/2008   Obesity 03/18/2008   TRANSAMINASES, SERUM, ELEVATED 03/18/2008   MENOPAUSE-RELATED VASOMOTOR SYMPTOMS, HOT FLASHES 10/23/2006   Hypothyroidism 10/11/2006   GLAUCOMA 10/11/2006   Essential hypertension 10/11/2006   GERD 10/11/2006   History of kidney stones 10/11/2006    Past Surgical History:  Procedure Laterality Date   ABDOMINAL HYSTERECTOMY     partial ? prolapse   BREAST LUMPECTOMY WITH RADIOACTIVE SEED AND SENTINEL LYMPH NODE BIOPSY Left 07/12/2018   Procedure: LEFT BREAST LUMPECTOMY WITH RADIOACTIVE SEED AND LEFT AXILLARY SENTINEL LYMPH NODE BIOPSY;  Surgeon: Rolm Bookbinder, MD;  Location: [facility];  Service: General;  Laterality: Left;   CHOLECYSTECTOMY     ESOPHAGOGASTRODUODENOSCOPY     IR PARACENTESIS  12/09/2020   TONSILLECTOMY      OB History   No obstetric history on file.      Home Medications    Prior to Admission medications   Medication Sig Start Date End Date Taking? Authorizing Provider  anastrozole (ARIMIDEX) 1 MG tablet Take 1 tablet by mouth daily Patient taking differently: Take 1 mg by mouth daily. 09/08/20   Magrinat, Virgie Dad, MD  Biotin 2500 MCG CAPS Take 5,000 mcg by mouth daily.    [provider]  Cholecalciferol (VITAMIN D3) 2000 units TABS Take 2,000 Units by mouth  daily.    [provider]  furosemide (LASIX) 40 MG tablet Take 1 tablet (40 mg total) by mouth daily. 12/10/20   Tower, [name], MD  levothyroxine (SYNTHROID) 200 MCG tablet Take 1 tablet (200 mcg total) by mouth daily before breakfast. 10/08/20   Tower, [name], MD  loratadine (CLARITIN) 10 MG tablet Take 10 mg by mouth daily as needed.    [provider]  metoprolol succinate (TOPROL-XL) 100 MG 24 hr tablet Take 1 by mouth daily with or after a meal Patient taking differently: Take 100 mg by mouth daily. 09/07/20   Tower, [name], MD  Multiple Vitamin  (MULTIVITAMIN) capsule Take 1 capsule by mouth daily.    [provider]  potassium chloride SA (KLOR-CON) 20 MEQ tablet Take 1 tablet by mouth every day Patient taking differently: Take 20 mEq by mouth daily. 09/07/20   Tower, [name], MD  spironolactone (ALDACTONE) 25 MG tablet Take 1 tablet (25 mg total) by mouth 2 (two) times daily. 12/10/20   Tower, [name], MD    Family History Family History  Problem Relation Age of Onset   Heart disease Mother        CAD   Hypertension Mother    Glaucoma Mother    Heart disease Father        CAD   Diabetes Father    Hypertension Father    Cancer Father        ? CA - squamous cell carcinoma   Colon cancer Neg Hx    Esophageal cancer Neg Hx    Rectal cancer Neg Hx    Stomach cancer Neg Hx     Social History Social History   Tobacco Use   Smoking status: Former    Types: Cigarettes    Quit date: 06/06/1982    Years since quitting: 38.5   Smokeless tobacco: Never  Vaping Use   Vaping Use: Never used  Substance Use Topics   Alcohol use: Yes    Alcohol/week: 0.0 standard drinks    Comment: wine- rare   Drug use: No     Allergies   Codeine and Ivp dye [iodinated diagnostic agents]   Review of Systems Review of Systems  Musculoskeletal:  Positive for myalgias.  Skin:  Positive for rash and wound.  All other systems reviewed and are negative.   Physical Exam Triage Vital Signs ED Triage Vitals  Enc Vitals Group     BP 12/21/20 1904 (!) 166/75     Pulse Rate 12/21/20 1904 86     Resp 12/21/20 1904 20     Temp 12/21/20 1904 98 F (36.7 C)     Temp Source 12/21/20 1904 Oral     SpO2 12/21/20 1904 96 %     Weight --      Height --      Head Circumference --      Peak Flow --      Pain Score 12/21/20 1907 6     Pain Loc --      Pain Edu? --      Excl. in [place]? --    No data found.  Updated Vital Signs BP (!) 166/75   Pulse 86   Temp 98 F (36.7 C) (Oral)   Resp 20   SpO2 96%   Visual Acuity Right Eye  Distance:   Left Eye Distance:   Bilateral Distance:    Right Eye Near:   Left Eye Near:  Bilateral Near:     Physical Exam Vitals and nursing note reviewed.  Constitutional:      Appearance: She is well-developed.  HENT:     Head: Normocephalic.  Pulmonary:     Effort: Pulmonary effort is normal.  Abdominal:     General: There is no distension.  Musculoskeletal:        General: Swelling and tenderness present.     Cervical back: Normal range of motion.     Right lower leg: Edema present.     Left lower leg: Edema present.  Skin:    General: Skin is warm.  Neurological:     Mental Status: She is alert and oriented to person, place, and time.     UC Treatments / Results  Labs (all labs ordered are listed, but only abnormal results are displayed) Labs Reviewed - No data to display  EKG   Radiology No results found.  Procedures Procedures (including critical care time)  Medications Ordered in UC Medications - No data to display  Initial Impression / Assessment and Plan / UC Course  I have reviewed the triage vital signs and the nursing notes.  Pertinent labs & imaging results that were available during my care of the patient were reviewed by me and considered in my medical decision making (see chart for details).     MDM:  Pt has weeping edema from both legs.  Pt refuses to go to Emergency department..  Pt advised to take an extra dose of lasix.   Final Clinical Impressions(s) / UC Diagnoses   Final diagnoses:  Venous stasis dermatitis of both lower extremities  Edema of both lower extremities     Discharge Instructions      Return if any problems.  Elevate lower legs,  Take an extra dosage of lasix.  Go to the Emergency department if symptoms worsen    ED Prescriptions   None    PDMP not reviewed this encounter. An After Visit Summary was printed and given to the patient.    Fransico Meadow, [place] 12/23/20 1504

## 2020-12-24 ENCOUNTER — Telehealth: Payer: Self-pay

## 2020-12-24 NOTE — Telephone Encounter (Signed)
Patient calls today to report bilateral swollen legs - they are warm and red, but not really painful. She has open weeping wounds on her legs, right worse than left. She was seen in office in June and reflux studies were performed - incompetent veins present bilaterally. Placed her on schedule Monday for PA evaluation and possible UNNA boot.

## 2020-12-25 ENCOUNTER — Other Ambulatory Visit: Payer: Self-pay

## 2020-12-25 ENCOUNTER — Emergency Department (HOSPITAL_COMMUNITY)
Admission: EM | Admit: 2020-12-25 | Discharge: 2020-12-26 | Disposition: A | Discharge status: Home / Self Care | Payer: Medicare Other | Attending: Emergency Medicine | Admitting: Emergency Medicine

## 2020-12-25 ENCOUNTER — Encounter (HOSPITAL_COMMUNITY): Payer: Self-pay

## 2020-12-25 DIAGNOSIS — Z85828 Personal history of other malignant neoplasm of skin: Secondary | ICD-10-CM | POA: Diagnosis not present

## 2020-12-25 DIAGNOSIS — K625 Hemorrhage of anus and rectum: Secondary | ICD-10-CM | POA: Diagnosis not present

## 2020-12-25 DIAGNOSIS — L03115 Cellulitis of right lower limb: Secondary | ICD-10-CM | POA: Diagnosis not present

## 2020-12-25 DIAGNOSIS — R5383 Other fatigue: Secondary | ICD-10-CM | POA: Insufficient documentation

## 2020-12-25 DIAGNOSIS — E039 Hypothyroidism, unspecified: Secondary | ICD-10-CM | POA: Diagnosis not present

## 2020-12-25 DIAGNOSIS — Z87891 Personal history of nicotine dependence: Secondary | ICD-10-CM | POA: Diagnosis not present

## 2020-12-25 DIAGNOSIS — K644 Residual hemorrhoidal skin tags: Secondary | ICD-10-CM | POA: Diagnosis not present

## 2020-12-25 DIAGNOSIS — Z853 Personal history of malignant neoplasm of breast: Secondary | ICD-10-CM | POA: Insufficient documentation

## 2020-12-25 DIAGNOSIS — Z79899 Other long term (current) drug therapy: Secondary | ICD-10-CM | POA: Insufficient documentation

## 2020-12-25 DIAGNOSIS — R140 Abdominal distension (gaseous): Secondary | ICD-10-CM | POA: Insufficient documentation

## 2020-12-25 DIAGNOSIS — I129 Hypertensive chronic kidney disease with stage 1 through stage 4 chronic kidney disease, or unspecified chronic kidney disease: Secondary | ICD-10-CM | POA: Insufficient documentation

## 2020-12-25 DIAGNOSIS — N189 Chronic kidney disease, unspecified: Secondary | ICD-10-CM | POA: Insufficient documentation

## 2020-12-25 DIAGNOSIS — R42 Dizziness and giddiness: Secondary | ICD-10-CM | POA: Insufficient documentation

## 2020-12-25 LAB — COMPREHENSIVE METABOLIC PANEL
ALT: 28 U/L (ref 0–44)
AST: 56 U/L — ABNORMAL HIGH (ref 15–41)
Albumin: 3.5 g/dL (ref 3.5–5.0)
Alkaline Phosphatase: 96 U/L (ref 38–126)
Anion gap: 8 (ref 5–15)
BUN: 8 mg/dL (ref 8–23)
CO2: 26 mmol/L (ref 22–32)
Calcium: 9.1 mg/dL (ref 8.9–10.3)
Chloride: 101 mmol/L (ref 98–111)
Creatinine, Ser: 0.48 mg/dL (ref 0.44–1.00)
GFR, Estimated: >60 mL/min (ref >60)
Glucose, Bld: 106 mg/dL — ABNORMAL HIGH (ref 70–99)
Potassium: 3.4 mmol/L — ABNORMAL LOW (ref 3.5–5.1)
Sodium: 135 mmol/L (ref 135–145)
Total Bilirubin: 2.8 mg/dL — ABNORMAL HIGH (ref 0.3–1.2)
Total Protein: 6.4 g/dL — ABNORMAL LOW (ref 6.5–8.1)

## 2020-12-25 LAB — CBC WITH DIFFERENTIAL/PLATELET
Abs Immature Granulocytes: 0.01 10*3/uL (ref 0.00–0.07)
Basophils Absolute: 0 10*3/uL (ref 0.0–0.1)
Basophils Relative: 0 %
Eosinophils Absolute: 0.2 10*3/uL (ref 0.0–0.5)
Eosinophils Relative: 4 %
HCT: 36.7 % (ref 36.0–46.0)
Hemoglobin: 12.7 g/dL (ref 12.0–15.0)
Immature Granulocytes: 0 %
Lymphocytes Relative: 14 %
Lymphs Abs: 0.7 10*3/uL (ref 0.7–4.0)
MCH: 36.2 pg — ABNORMAL HIGH (ref 26.0–34.0)
MCHC: 34.6 g/dL (ref 30.0–36.0)
MCV: 104.6 fL — ABNORMAL HIGH (ref 80.0–100.0)
Monocytes Absolute: 0.5 10*3/uL (ref 0.1–1.0)
Monocytes Relative: 11 %
Neutro Abs: 3.4 10*3/uL (ref 1.7–7.7)
Neutrophils Relative %: 71 %
Platelets: 96 10*3/uL — ABNORMAL LOW (ref 150–400)
RBC: 3.51 MIL/uL — ABNORMAL LOW (ref 3.87–5.11)
RDW: 14.1 % (ref 11.5–15.5)
WBC: 4.9 10*3/uL (ref 4.0–10.5)
nRBC: 0 % (ref 0.0–0.2)

## 2020-12-25 LAB — TYPE AND SCREEN
ABO/RH(D): O POS
Antibody Screen: NEGATIVE

## 2020-12-25 LAB — POC OCCULT BLOOD, ED: Fecal Occult Bld: POSITIVE — AB

## 2020-12-25 NOTE — ED Triage Notes (Signed)
Pt reports sudden onset rectal bleeding with blood clots that began today. Pt denies abdominal pain and N/V. Pt also has leg swelling with weeping.

## 2020-12-25 NOTE — ED Provider Notes (Signed)
Emergency Medicine Provider Triage Evaluation Note  Selena Johnson , a 70 y.o. female  was evaluated in triage.  Pt complains of rectal bleeding.  Patient states she has been very stressed recently.  Her husband is currently under hospice care and will likely not make it very much longer.  She has history of NASH cirrhosis.  Has known history of varices.  States went to change pants and noticed bright red blood per rectum.  She is was not trying to pass a bowel movement at that time.  Noted some clots.  No associated diarrhea, abdominal pain. No weakness, dizziness  GI- [facility]  Review of Systems  Positive: Rectal bleeding Negative: Abd pain, emesis, diarrhea  Physical Exam  BP (!) 150/107 (BP Location: Left Arm)   Pulse 86   Temp 98.7 F (37.1 C) (Oral)   Resp 18   Ht 5' 5"  (1.651 m)   Wt 96 kg   SpO2 98%   BMI 35.21 kg/m  Gen:   Awake, no distress   Resp:  Normal effort  MSK:   Moves extremities without difficulty  ABD:  Distended, non tender, hernia midline Other:    Medical Decision Making  Medically screening exam initiated at 9:13 PM.  Appropriate orders placed.  Terese Door was informed that the remainder of the evaluation will be completed by another provider, this initial triage assessment does not replace that evaluation, and the importance of remaining in the ED until their evaluation is complete.  Rectal bleeding  Hemodynamically stable, labs ordered.  She has contrast allergy   Argil Mahl A, PA-C 12/25/20 2113    Gareth Morgan, MD 12/26/20 [phone]

## 2020-12-25 NOTE — ED Provider Notes (Signed)
[facility] DEPT Provider Note   CSN: [id number] Arrival date & time: 12/25/20  2030     History Chief Complaint  Patient presents with   Rectal Bleeding    Selena Johnson is a 70 y.o. female.  HPI     70 year old female with a history of hypertension, hypothyroidism, breast cancer in 2020, cirrhosis seen by low [facility] gastroenterology NASH suspected, small esophageal varices per EGD 12/08/2020, anasarca, thrombocytopenia, past cholecystectomy, partial abdominal hysterectomy, who presents with concern for rectal bleeding.  Her husband is currently in hospice and has hours to days to live.  She has been spending a lot of time there.  Reports had not had a bowel movement for few days, but did have a bowel movement today that was normal in color.  Denies it being hard or having rectal pain.  Denied having any black tarry stools, maroon-colored stools, or other discoloration.  Reports that she had this bowel movement this morning.  This evening around 8 PM, she thought she was passing flatus, and found blood in her underpants.  Reports that there is one blood clot and otherwise bright red rectal bleeding.  Reports that it continued while she was sitting on the toilet.  It was not associated with having a bowel movement. She reports having some fatigue and general lightheadedness but has not been resting much and spending a lot of time at hospice.  Denies abdominal pain, nausea, vomiting, diarrhea, fevers.  Reports that she has had right lower extremity erythema over the last 4 days.  She had previously seen vascular regarding venous insufficiency, however the redness of the right lower extremity is new.  Past Medical History:  Diagnosis Date   Allergy    Cancer ([place])    basal cell skin CA   Chronic kidney disease    hx of kidney stones   Conjunctivitis    chronic   Fatty liver 2015   GERD (gastroesophageal reflux disease)    Glaucoma    Hearing loss in left  ear    History of kidney stones    Hyperlipidemia    no per pt   Hypertension    Menopausal syndrome    NASH (nonalcoholic steatohepatitis)    with cirrhosis and mild esoph varicies   Primary hypothyroidism     Patient Active Problem List   Diagnosis Date Noted   Venous insufficiency 10/27/2020   Volume overload 10/21/2020   Pedal edema 10/21/2020   Caregiver stress 09/07/2020   Secondary esophageal varices without bleeding ([place]) 09/07/2020   Malignant neoplasm of upper-inner quadrant of left breast in female, estrogen receptor positive ([place]) 06/27/2018   Left breast mass 06/17/2018   Elevated random blood glucose level 07/28/2017   Skin cancer screening 05/05/2017   Colon cancer screening 07/21/2015   Estrogen deficiency 07/21/2015   Calcification of right breast 05/06/2015   Encounter for Medicare annual wellness exam 07/20/2014   Unspecified constipation 10/25/2012   Hepatic cirrhosis ([place]) 10/23/2012   Hypokalemia 07/13/2012   Thrombocytopenia ([place]) 11/10/2010   Routine general medical examination at a health care facility 11/04/2010   Hyperlipidemia 03/18/2008   Obesity 03/18/2008   TRANSAMINASES, SERUM, ELEVATED 03/18/2008   MENOPAUSE-RELATED VASOMOTOR SYMPTOMS, HOT FLASHES 10/23/2006   Hypothyroidism 10/11/2006   GLAUCOMA 10/11/2006   Essential hypertension 10/11/2006   GERD 10/11/2006   History of kidney stones 10/11/2006    Past Surgical History:  Procedure Laterality Date   ABDOMINAL HYSTERECTOMY     partial ? prolapse  BREAST LUMPECTOMY WITH RADIOACTIVE SEED AND SENTINEL LYMPH NODE BIOPSY Left 07/12/2018   Procedure: LEFT BREAST LUMPECTOMY WITH RADIOACTIVE SEED AND LEFT AXILLARY SENTINEL LYMPH NODE BIOPSY;  Surgeon: Rolm Bookbinder, MD;  Location: [facility];  Service: General;  Laterality: Left;   CHOLECYSTECTOMY     ESOPHAGOGASTRODUODENOSCOPY     IR PARACENTESIS  12/09/2020   TONSILLECTOMY       OB History   No obstetric history on file.     Family  History  Problem Relation Age of Onset   Heart disease Mother        CAD   Hypertension Mother    Glaucoma Mother    Heart disease Father        CAD   Diabetes Father    Hypertension Father    Cancer Father        ? CA - squamous cell carcinoma   Colon cancer Neg Hx    Esophageal cancer Neg Hx    Rectal cancer Neg Hx    Stomach cancer Neg Hx     Social History   Tobacco Use   Smoking status: Former    Types: Cigarettes    Quit date: 06/06/1982    Years since quitting: 38.5   Smokeless tobacco: Never  Vaping Use   Vaping Use: Never used  Substance Use Topics   Alcohol use: Yes    Alcohol/week: 0.0 standard drinks    Comment: wine- rare   Drug use: No    Home Medications Prior to Admission medications   Medication Sig Start Date End Date Taking? Authorizing Provider  anastrozole (ARIMIDEX) 1 MG tablet Take 1 tablet by mouth daily Patient taking differently: Take 1 mg by mouth daily. 09/08/20  Yes Magrinat, Virgie Dad, MD  Biotin 2500 MCG CAPS Take 5,000 mcg by mouth daily.   Yes [provider]  cephALEXin (KEFLEX) 500 MG capsule Take 1 capsule (500 mg total) by mouth 3 (three) times daily for 7 days. 12/26/20 01/02/21 Yes Gareth Morgan, MD  Cholecalciferol (VITAMIN D3) 2000 units TABS Take 2,000 Units by mouth daily.   Yes [provider]  furosemide (LASIX) 40 MG tablet Take 1 tablet (40 mg total) by mouth daily. 12/10/20  Yes Tower, [name], MD  levothyroxine (SYNTHROID) 200 MCG tablet Take 1 tablet (200 mcg total) by mouth daily before breakfast. 10/08/20  Yes Tower, [name], MD  loratadine (CLARITIN) 10 MG tablet Take 10 mg by mouth daily.   Yes [provider]  metoprolol succinate (TOPROL-XL) 100 MG 24 hr tablet Take 1 by mouth daily with or after a meal Patient taking differently: Take 100 mg by mouth daily. 09/07/20  Yes Tower, [name], MD  Multiple Vitamin (MULTIVITAMIN) capsule Take 1 capsule by mouth daily.   Yes [provider]   Polyvinyl Alcohol-Povidone PF (REFRESH) 1.4-0.6 % SOLN Place 1 drop into both eyes daily as needed (dry eyes).   Yes [provider]  potassium chloride SA (KLOR-CON) 20 MEQ tablet Take 1 tablet by mouth every day Patient taking differently: Take 20 mEq by mouth daily. 09/07/20  Yes Tower, [name], MD  spironolactone (ALDACTONE) 25 MG tablet Take 1 tablet (25 mg total) by mouth 2 (two) times daily. Patient taking differently: Take 50 mg by mouth daily. 12/10/20  Yes Tower, [name], MD    Allergies    Codeine and Ivp dye [iodinated diagnostic agents]  Review of Systems   Review of Systems  Constitutional:  Positive for  fatigue.  Respiratory:  Negative for cough and shortness of breath.   Cardiovascular:  Negative for chest pain.  Gastrointestinal:  Positive for abdominal distention and anal bleeding. Negative for abdominal pain, blood in stool, nausea and vomiting.  Genitourinary:  Negative for dysuria.  Musculoskeletal:  Negative for back pain.  Skin:  Positive for rash.  Neurological:  Negative for syncope. Light-headedness: has had prior to episode tonight.  Physical Exam Updated Vital Signs BP (!) 145/75   Pulse 81   Temp 98.2 F (36.8 C)   Resp 16   Ht 5' 5"  (1.651 m)   Wt 96 kg   SpO2 100%   BMI 35.21 kg/m   Physical Exam Vitals and nursing note reviewed.  Constitutional:      General: She is not in acute distress.    Appearance: She is well-developed. She is not diaphoretic.  HENT:     Head: Normocephalic and atraumatic.  Eyes:     Conjunctiva/sclera: Conjunctivae normal.  Cardiovascular:     Rate and Rhythm: Normal rate and regular rhythm.     Heart sounds: Normal heart sounds. No murmur heard.   No friction rub. No gallop.  Pulmonary:     Effort: Pulmonary effort is normal. No respiratory distress.     Breath sounds: Normal breath sounds. No wheezing or rales.  Abdominal:     General: Abdomen is protuberant. There is distension.     Palpations:  Abdomen is soft. There is fluid wave.     Tenderness: There is no abdominal tenderness. There is no guarding.  Genitourinary:    Rectum: Guaiac result positive (scant blood on digit). External hemorrhoid present.  Musculoskeletal:        General: No tenderness.     Cervical back: Normal range of motion.     Comments: Erythema RLE, warmth, no asymmetric swelling, normal pulses  Skin:    General: Skin is warm and dry.     Findings: No erythema or rash.  Neurological:     Mental Status: She is alert and oriented to person, place, and time.    ED Results / Procedures / Treatments   Labs (all labs ordered are listed, but only abnormal results are displayed) Labs Reviewed  CBC WITH DIFFERENTIAL/PLATELET - Abnormal; Notable for the following components:      Result Value   RBC 3.51 (*)    MCV 104.6 (*)    MCH 36.2 (*)    Platelets 96 (*)    All other components within normal limits  COMPREHENSIVE METABOLIC PANEL - Abnormal; Notable for the following components:   Potassium 3.4 (*)    Glucose, Bld 106 (*)    Total Protein 6.4 (*)    AST 56 (*)    Total Bilirubin 2.8 (*)    All other components within normal limits  POC OCCULT BLOOD, ED - Abnormal; Notable for the following components:   Fecal Occult Bld POSITIVE (*)    All other components within normal limits  PROTIME-INR  TYPE AND SCREEN  ABO/RH    EKG None  Radiology No results found.  Procedures Procedures   Medications Ordered in ED Medications - No data to display  ED Course  I have reviewed the triage vital signs and the nursing notes.  Pertinent labs & imaging results that were available during my care of the patient were reviewed by me and considered in my medical decision making (see chart for details).    MDM Rules/Calculators/A&P  70 year old female with a history of hypertension, hypothyroidism, breast cancer in 2020, cirrhosis seen by low [facility] gastroenterology NASH  suspected, small esophageal varices per EGD 12/08/2020, anasarca, thrombocytopenia, past cholecystectomy, partial abdominal hysterectomy, who presents with concern for rectal bleeding.  She is hemodynamically stable.  Observed in the emergency department for 4 hours after the initial event without change in vital signs, no tachycardia, no hypotension, no additional episodes of rectal bleeding.  She has stable or slightly improved thrombocytopenia.  No anemia on CBC. Ambulatory. Discussed given her medical history and description of the incident, I would typically admit her to the hospital for observation, however given circumstance involving her husband of 50 years at end of life we are planning on discharge with strict return precautions.  Discussed that it is possible that her bleeding may be related to internal hemorrhoids, however its difficult at this time to rule out other etiologies of more significant bleeding.  She does not want to stay in the hospital given her husband's condition. Discussed that if she has return or worsening symptoms that she needs to return for further evaluation.  In addition, she has RLE erythema--pattern seen is more consistent with cellulitis and doubt DVT.  Will give rx for keflex, recommend outpatient follow up for the cellulitis.   Final Clinical Impression(s) / ED Diagnoses Final diagnoses:  Rectal bleeding  Cellulitis of right lower extremity    Rx / DC Orders ED Discharge Orders          Ordered    cephALEXin (KEFLEX) 500 MG capsule  3 times daily        12/26/20 0006             Gareth Morgan, MD 12/26/20 [id number]

## 2020-12-26 MED ORDER — CEPHALEXIN 500 MG PO CAPS: 500.0000 mg | ORAL_CAPSULE | Freq: Three times a day (TID) | ORAL | 0 refills | Status: AC

## 2020-12-28 ENCOUNTER — Ambulatory Visit (INDEPENDENT_AMBULATORY_CARE_PROVIDER_SITE_OTHER): Payer: Medicare Other | Admitting: Physician Assistant

## 2020-12-28 ENCOUNTER — Other Ambulatory Visit: Payer: Self-pay

## 2020-12-28 ENCOUNTER — Telehealth: Payer: Self-pay | Admitting: Internal Medicine

## 2020-12-28 VITALS — BP 131/82 | HR 80 | Temp 97.9°F | Resp 20 | Ht 65.0 in | Wt 206.4 lb

## 2020-12-28 DIAGNOSIS — I872 Venous insufficiency (chronic) (peripheral): Secondary | ICD-10-CM | POA: Diagnosis not present

## 2020-12-28 NOTE — Progress Notes (Signed)
Office Note     CC:  follow up Requesting Provider:  Abner Greenspan, MD  HPI: Selena Johnson is a 70 y.o. (05-Jul-1950) female who presents for evaluation of bilateral lower extremity edema and weeping.  Patient states she went to the emergency department on Friday about 72 hours ago with redness and weeping right lower leg.  She was diagnosed with cellulitis and started on antibiotics.  She states the redness has since improved however she still has some weeping from both legs.  She admittedly has not been elevating her legs much or wearing compression as her husband just passed away on 12/29/20.  She has been on her feet and handling funeral and estate planning.  She denies any fevers, chills, nausea/vomiting.  She is scheduled to come back in a couple months to see Dr. Scot Dock for evaluation for possible laser ablation therapy of right lower extremity.  Past Medical History:  Diagnosis Date   Allergy    Cancer ([place])    basal cell skin CA   Chronic kidney disease    hx of kidney stones   Conjunctivitis    chronic   Fatty liver 2015   GERD (gastroesophageal reflux disease)    Glaucoma    Hearing loss in left ear    History of kidney stones    Hyperlipidemia    no per pt   Hypertension    Menopausal syndrome    NASH (nonalcoholic steatohepatitis)    with cirrhosis and mild esoph varicies   Primary hypothyroidism     Past Surgical History:  Procedure Laterality Date   ABDOMINAL HYSTERECTOMY     partial ? prolapse   BREAST LUMPECTOMY WITH RADIOACTIVE SEED AND SENTINEL LYMPH NODE BIOPSY Left 07/12/2018   Procedure: LEFT BREAST LUMPECTOMY WITH RADIOACTIVE SEED AND LEFT AXILLARY SENTINEL LYMPH NODE BIOPSY;  Surgeon: Rolm Bookbinder, MD;  Location: [facility];  Service: General;  Laterality: Left;   CHOLECYSTECTOMY     ESOPHAGOGASTRODUODENOSCOPY     IR PARACENTESIS  12/09/2020   TONSILLECTOMY      Social History   Socioeconomic History   Marital status: Married    Spouse name:  Dwaine Deter. [place]   Number of children: Not on file   Years of education: Not on file   Highest education level: Not on file  Occupational History   Not on file  Tobacco Use   Smoking status: Former    Types: Cigarettes    Quit date: 06/06/1982    Years since quitting: 38.5   Smokeless tobacco: Never  Vaping Use   Vaping Use: Never used  Substance and Sexual Activity   Alcohol use: Yes    Alcohol/week: 0.0 standard drinks    Comment: wine- rare   Drug use: No   Sexual activity: Not on file  Other Topics Concern   Not on file  Social History Narrative   Not on file   Social Determinants of Health   Financial Resource Strain: Low Risk    Difficulty of Paying Living Expenses: Not hard at all  Food Insecurity: No Food Insecurity   Worried About Charity fundraiser in the Last Year: Never true   [place] in the Last Year: Never true  Transportation Needs: No Transportation Needs   Lack of Transportation (Medical): No   Lack of Transportation (Non-Medical): No  Physical Activity: Inactive   Days of Exercise per Week: 0 days   Minutes of Exercise per Session: 0 min  Stress:  Stress Concern Present   Feeling of Stress : Rather much  Social Connections: Not on file  Intimate Partner Violence: Not At Risk   Fear of Current or Ex-Partner: No   Emotionally Abused: No   Physically Abused: No   Sexually Abused: No    Family History  Problem Relation Age of Onset   Heart disease Mother        CAD   Hypertension Mother    Glaucoma Mother    Heart disease Father        CAD   Diabetes Father    Hypertension Father    Cancer Father        ? CA - squamous cell carcinoma   Colon cancer Neg Hx    Esophageal cancer Neg Hx    Rectal cancer Neg Hx    Stomach cancer Neg Hx     Current Outpatient Medications  Medication Sig Dispense Refill   anastrozole (ARIMIDEX) 1 MG tablet Take 1 tablet by mouth daily (Patient taking differently: Take 1 mg by mouth daily.) 90 tablet 3    Biotin 2500 MCG CAPS Take 5,000 mcg by mouth daily.     cephALEXin (KEFLEX) 500 MG capsule Take 1 capsule (500 mg total) by mouth 3 (three) times daily for 7 days. 21 capsule 0   Cholecalciferol (VITAMIN D3) 2000 units TABS Take 2,000 Units by mouth daily.     furosemide (LASIX) 40 MG tablet Take 1 tablet (40 mg total) by mouth daily. 90 tablet 1   levothyroxine (SYNTHROID) 200 MCG tablet Take 1 tablet (200 mcg total) by mouth daily before breakfast. 15 tablet 0   loratadine (CLARITIN) 10 MG tablet Take 10 mg by mouth daily.     metoprolol succinate (TOPROL-XL) 100 MG 24 hr tablet Take 1 by mouth daily with or after a meal (Patient taking differently: Take 100 mg by mouth daily.) 90 tablet 3   Multiple Vitamin (MULTIVITAMIN) capsule Take 1 capsule by mouth daily.     Polyvinyl Alcohol-Povidone PF (REFRESH) 1.4-0.6 % SOLN Place 1 drop into both eyes daily as needed (dry eyes).     potassium chloride SA (KLOR-CON) 20 MEQ tablet Take 1 tablet by mouth every day (Patient taking differently: Take 20 mEq by mouth daily.) 90 tablet 3   spironolactone (ALDACTONE) 25 MG tablet Take 1 tablet (25 mg total) by mouth 2 (two) times daily. (Patient taking differently: Take 50 mg by mouth daily.) 180 tablet 1   No current facility-administered medications for this visit.    Allergies  Allergen Reactions   Codeine     REACTION: Throat swelling   Ivp Dye [Iodinated Diagnostic Agents] Swelling     REVIEW OF SYSTEMS:   [X]  denotes positive finding, [ ]  denotes negative finding Cardiac  Comments:  Chest pain or chest pressure:    Shortness of breath upon exertion:    Short of breath when lying flat:    Irregular heart rhythm:        Vascular    Pain in calf, thigh, or hip brought on by ambulation:    Pain in feet at night that wakes you up from your sleep:     Blood clot in your veins:    Leg swelling:         Pulmonary    Oxygen at home:    Productive cough:     Wheezing:         Neurologic     Sudden weakness in arms or  legs:     Sudden numbness in arms or legs:     Sudden onset of difficulty speaking or slurred speech:    Temporary loss of vision in one eye:     Problems with dizziness:         Gastrointestinal    Blood in stool:     Vomited blood:         Genitourinary    Burning when urinating:     Blood in urine:        Psychiatric    Major depression:         Hematologic    Bleeding problems:    Problems with blood clotting too easily:        Skin    Rashes or ulcers:        Constitutional    Fever or chills:      PHYSICAL EXAMINATION:  Vitals:   12/28/20 1058  BP: 131/82  Pulse: 80  Resp: 20  Temp: 97.9 F (36.6 C)  TempSrc: Temporal  SpO2: 98%  Weight: 206 lb 6.4 oz (93.6 kg)  Height: 5' 5"  (1.651 m)    General:  WDWN in NAD; vital signs documented above Gait: Not observed HENT: WNL, normocephalic Pulmonary: normal non-labored breathing Cardiac: regular HR Abdomen: soft, NT, no masses Skin: without rashes Vascular Exam/Pulses:  Right Left  Radial 2+ (normal) 2+ (normal)  DP 2+ (normal) 2+ (normal)   Extremities: without ischemic changes, without Gangrene , with cellulitis of RLE; without open wounds; weeping from R distal shin clear fluid Musculoskeletal: no muscle wasting or atrophy  Neurologic: A&O X 3;  No focal weakness or paresthesias are detected Psychiatric:  The pt has Normal affect.    ASSESSMENT/PLAN:: 70 y.o. female here for follow up for evaluation of bilateral lower extremity edema and weeping as well as right lower extremity cellulitis  -Subjectively cellulitis of right lower extremity is improving since being started on Keflex in the emergency department 3 days prior -Now that the right lower extremity tenderness has improved she will attempt to wear her compression daily.  I provided her with Ace bandages if she is unable to tolerate compression -We also reviewed proper leg elevation which should be done  periodically throughout the day -If she is to develop any wounds we discussed placement of an Unna boot however I do not think that is necessary today -She will take her antibiotic to completion -She will come back for her previously scheduled follow-up with Dr. Scot Dock for further evaluation of venous insufficiency to see if she would be a candidate for laser ablation therapy of right lower extremity   Dagoberto Ligas, PA-C [facility] [phone]  Clinic MD:   Trula Slade

## 2020-12-28 NOTE — Telephone Encounter (Signed)
Patient calling to inform she visited the ED Friday, 12/25/20 for passing blood from rectum. Pt requesting to see GI provider sooner than 03/09/21.. Plz advise   thank you

## 2020-12-28 NOTE — Telephone Encounter (Signed)
Patient reports that she had large amount of rectal bleeding on Friday pm.  No bleeding in the ED.  They sent her home for outpatient follow up.  Possible hemorrhoid source.  She is scheduled for the next available appointment with Tye Savoy RNP on 8/3 1:30.  She will call back for a return of the bleeding or report back to ED.

## 2020-12-30 ENCOUNTER — Other Ambulatory Visit: Payer: Self-pay

## 2020-12-30 DIAGNOSIS — K7460 Unspecified cirrhosis of liver: Secondary | ICD-10-CM

## 2020-12-30 DIAGNOSIS — R7989 Other specified abnormal findings of blood chemistry: Secondary | ICD-10-CM

## 2021-01-06 ENCOUNTER — Encounter: Payer: Self-pay | Admitting: Nurse Practitioner

## 2021-01-06 ENCOUNTER — Ambulatory Visit (INDEPENDENT_AMBULATORY_CARE_PROVIDER_SITE_OTHER): Payer: Medicare Other | Admitting: Nurse Practitioner

## 2021-01-06 VITALS — BP 124/70 | HR 77 | Ht 65.0 in | Wt 207.0 lb

## 2021-01-06 DIAGNOSIS — K7290 Hepatic failure, unspecified without coma: Secondary | ICD-10-CM

## 2021-01-06 DIAGNOSIS — K625 Hemorrhage of anus and rectum: Secondary | ICD-10-CM | POA: Diagnosis not present

## 2021-01-06 DIAGNOSIS — K7460 Unspecified cirrhosis of liver: Secondary | ICD-10-CM | POA: Diagnosis not present

## 2021-01-06 DIAGNOSIS — Z86010 Personal history of colonic polyps: Secondary | ICD-10-CM

## 2021-01-06 MED ORDER — FUROSEMIDE 20 MG PO TABS: 60.0000 mg | ORAL_TABLET | Freq: Every day | ORAL | 1 refills | Status: DC

## 2021-01-06 MED ORDER — SPIRONOLACTONE 50 MG PO TABS
100.0000 mg | ORAL_TABLET | Freq: Every day | ORAL | 1 refills | Status: DC
Start: 2021-01-06 — End: 2021-02-17

## 2021-01-06 NOTE — Patient Instructions (Addendum)
You have been scheduled for a colonoscopy. Please follow written instructions given to you at your visit today.  Please pick up your prep supplies at the pharmacy within the next 1-3 days. If you use inhalers (even only as needed), please bring them with you on the day of your procedure.  Please come in a week to get blood work. No appointment needed. The lab is open Mon-Friday 8-5. You don't need to be fasting.  Increase your furosemide to 46m daily and increase your spironolactone to 1056mdaily. Refills have been sent in for these both.  If you get uncomfortable with abdominal distention call and ask for [facility] about getting a paracentesis.   Due to recent changes in healthcare laws, you may see the results of your imaging and laboratory studies on MyChart before your provider has had a chance to review them.  We understand that in some cases there may be results that are confusing or concerning to you. Not all laboratory results come back in the same time frame and the provider may be waiting for multiple results in order to interpret others.  Please give usKorea8 hours in order for your provider to thoroughly review all the results before contacting the office for clarification of your results.   CoCarl Bestill be in touch with outstanding results.  I appreciate the opportunity to care for you. PaTye SavoyNP

## 2021-01-06 NOTE — Progress Notes (Signed)
ASSESSMENT AND PLAN     # 70 yo female recently seen in ED for painless rectal bleeding, now resolved. EDP found external hemorrhoids on exam. Bleeding probably was hemorrhoidal Due for 5 year polyp surveillance  colonoscopy in November 2022, will go ahead an get this scheduled.  --Colonoscopy in about 6 weeks, would like to get some of this excess fluid off from her first. The risks and benefits of colonoscopy with possible polypectomy / biopsies were discussed and the patient agrees to proceed.    # Decompensated cirrhosis, possibly NASH but evaluation in progress. She is significanly volume overloaded with bilateral lower extremity edema, abdominal wall edema and likely ascites.  --2.6 L paracentesis on 12/09/20. No SBP, cytology negative for malignancy --Discussed importance of strict adherence to 2 gram Na+ diet.  --She is on Lasix  40 mg and Aldactone 36m daily. Renal function is normal. BP 120/70.  Increase dose to 60 mg  / 100 mg respectively.  --She takes K+ supplements so will need to monitor for hyperkalemia as well as monitor renal function >> BMET in a week.  --Holding off on paracentesis for now. Call if becomes uncomfortable / hard time breathing and we can arrange for another paracentesis.  Hopefully renal function remains okay and we can continue to escalate diuretics.  --CCarl Best PA will contact her with results of outstanding studies and further recommendations --Follow up in clinic with Dr. GCarlean Purla few weeks after colonoscopy.     HISTORY OF PRESENT ILLNESS    Chief Complaint : follow up on ED visit for rectal bleeding  Selena FALKis a 70y.o. female known to Dr. GCarlean Purl with a past medical history significant for cirrhosis, breast cancer ( 2020) s/p lumpectomy and radiation, hypothyroidism, adenomatous colon polyps . See PMH below for any additional medical problems.    **Patient followed years ago by Dr. KDeatra Inafor cirrhosis complicated by  portal hypertension but she was lost to follow up. In 2017 she had screening colonoscopy with Dr. NSilverio Decampwith findings of a couple of small tubular adenomas. She wasn't seen again until late June of this year when she established care with CCarl Best PA and Dr. GCarlean Purlfor evaluation and treatment of cirrhosis following a hospital admission in May 2022 for fluid overload. Patient had been evaluated by Cardiology ( in hospital) who did not feel heart failure was contributing to the edema. Echocardiogram was unremarkable.  She was discharged home from the hospital 5/20 on Lasix 40 mg and spironolactone 25 mg daily . When she was seen here on 6/28 we ordered a complete hepatic serologic work-up, scheduled EGD for varices screening , started 2 g sodium diet , and planned for abdominal imaging .  EGD >>> Grade II, small (< 5 mm) varices in the mid esophagus and in the distal esophagus.she is on the recall list for a 1 year surveillance EGD.  Since her visit here late June patient has undergone a large paracentesis.  No SBP and cytology negative.  Iron studies were elevated so Colleen ordered hemochromatosis studies. She is scheduled to have an MRI to further evaluate liver for iron overload. Based on labs her aldactone was increased to 50 mg qd on 12/04/20.     INTERVAL HISTORY :   Patient worked in for evaluation of recent painless rectal bleeding for which she was seen in the ED on 7/22.the bleeding had started earlier in the day when she  passed a lot  of red blood with clots with urination / flatulence.  In the ED patient was hemodynamically stable, hemoglobin 12.7 , platelets 96, T bili 2.8. E, issues she is mass upset whenever to get a score of her few words on iron she is xternal hemorrhoids found on EDPs examination.  Pa educate Selena Johnson denies constipation/straining prior to the onset of bleeding .  The ED felt admission was appropriate but patient's husband was in hospice with expected  survival time of only a couple of days so patient preferred not to be admitted.  She was discharged home with strict return precautions.  While in ED patient was diagnosed with RLE cellulitis and given a Rx for keflex. She hasn't had any further bleeding.   Patient mentions that she has been unable to wear suggested stockings on her lower legs due to the edema.  She admits to abdominal distention but does not feel uncomfortable.  She only gets short of breath when exerting herself.  She is trying to follow a low-salt diet but admits that there is hidden salt in a lot of things.  She is taking prescribed diuretics.  Her husband recently passed away, she lives alone.     PREVIOUS ENDOSCOPIC EVALUATIONS / PERTINENT STUDIES:   Colonoscopy 04/15/2016: - Erythematous mucosa in the cecum. Biopsied. - The examined portion of the ileum was normal. Biopsied. - Biopsies were taken with a cold forceps for histology in the rectum. - One 2 mm polyp in the cecum, removed with a cold biopsy forceps. Resected and retrieved - One 4 mm polyp in the ascending colon, removed with a cold snare. Resected and retrieved - 5 year recall colonoscopy  1. Surgical [P], cecum - BENIGN COLONIC MUCOSA WITH VASCULAR CONGESTION. - NO ACTIVE INFLAMMATION. - NO DYSPLASIA OR MALIGNANCY. 2. Surgical [P], cecum, ascending, polyp (2) - TUBULAR ADENOMA (X2 FRAGMENTS). - NO HIGH GRADE DYSPLASIA OR MALIGNANCY. 3. Surgical [P], terminal ileum - BENIGN SMALL BOWEL MUCOSA. - NO ACTIVE INFLAMMATION. - NO DYSPLASIA OR MALIGNANCY. 4. Surgical [P], rectum - BENIGN COLONIC MUCOSA. - NO ACTIVE INFLAMMATION. - NO DYSPLASIA OR MALIGNANCY.  12/08/20 EGD Grade II, small (< 5 mm) varices were found in the mid esophagus and in the distal esophagus. They were 4 mm in largest diameter. Findings: - Mild portal hypertensive gastropathy was found in the entire examined stomach. - The exam was otherwise without abnormality. - The cardia and  gastric fundus were normal on retroflexion.    May 2022 Echocardiogram  1. Left ventricular ejection fraction, by estimation, is 55 to 60%. The left ventricle has normal function. The left ventricle has no regional wall motion abnormalities. Left ventricular diastolic parameters were normal. 2. Right ventricular systolic function is normal. The right ventricular size is normal. 3. Left atrial size was moderately dilated. 4. The mitral valve is normal in structure. Trivial mitral valve regurgitation. No evidence of mitral stenosis. 5. The aortic valve is tricuspid. Aortic valve regurgitation is not visualized. No aortic stenosis is present.   Past Medical History:  Diagnosis Date   Allergy    Cancer ([place])    basal cell skin CA   Chronic kidney disease    hx of kidney stones   Conjunctivitis    chronic   Fatty liver 2015   GERD (gastroesophageal reflux disease)    Glaucoma    Hearing loss in left ear    History of kidney stones    Hyperlipidemia    no per pt   Hypertension  Menopausal syndrome    NASH (nonalcoholic steatohepatitis)    with cirrhosis and mild esoph varicies   Primary hypothyroidism     Current Medications, Allergies, Past Surgical History, Family History and Social History were reviewed in [employer] record.   Current Outpatient Medications  Medication Sig Dispense Refill   anastrozole (ARIMIDEX) 1 MG tablet Take 1 tablet by mouth daily (Patient taking differently: Take 1 mg by mouth daily.) 90 tablet 3   Biotin 2500 MCG CAPS Take 5,000 mcg by mouth daily.     Cholecalciferol (VITAMIN D3) 2000 units TABS Take 2,000 Units by mouth daily.     furosemide (LASIX) 40 MG tablet Take 1 tablet (40 mg total) by mouth daily. 90 tablet 1   levothyroxine (SYNTHROID) 200 MCG tablet Take 1 tablet (200 mcg total) by mouth daily before breakfast. 15 tablet 0   loratadine (CLARITIN) 10 MG tablet Take 10 mg by mouth daily.     metoprolol succinate  (TOPROL-XL) 100 MG 24 hr tablet Take 1 by mouth daily with or after a meal (Patient taking differently: Take 100 mg by mouth daily.) 90 tablet 3   Polyvinyl Alcohol-Povidone PF (REFRESH) 1.4-0.6 % SOLN Place 1 drop into both eyes daily as needed (dry eyes).     potassium chloride SA (KLOR-CON) 20 MEQ tablet Take 1 tablet by mouth every day (Patient taking differently: Take 20 mEq by mouth daily.) 90 tablet 3   spironolactone (ALDACTONE) 25 MG tablet Take 1 tablet (25 mg total) by mouth 2 (two) times daily. (Patient taking differently: Take 25 mg by mouth daily.) 180 tablet 1   No current facility-administered medications for this visit.    Review of Systems: No chest pain. No shortness of breath. No urinary complaints.   PHYSICAL EXAM :    Wt Readings from Last 3 Encounters:  01/06/21 207 lb (93.9 kg)  12/28/20 206 lb 6.4 oz (93.6 kg)  12/25/20 211 lb 9.6 oz (96 kg)    BP 124/70   Pulse 77   Ht 5' 5"  (1.651 m)   Wt 207 lb (93.9 kg)   SpO2 99%   BMI 34.45 kg/m  Constitutional:  Pleasant female in no acute distress. Psychiatric: Normal mood and affect. Behavior is normal. EENT: Pupils normal.  Conjunctivae are normal. No scleral icterus. Neck supple.  Cardiovascular: Normal rate, regular rhythm.  Pitting edema of BLE .  Abdominal wall edema  pulmonary/chest: Effort normal and breath sounds normal. No wheezing, rales or rhonchi. Abdominal: Soft, mod-largely distended, nontender. Bowel sounds active throughout. Marland Kitchen Neurological: Alert and oriented to person place and time. Skin: Skin is warm and dry. No rashes noted.  Tye Savoy, NP  01/06/2021, 1:54 PM  Cc:   Tower, [name], MD

## 2021-01-08 ENCOUNTER — Ambulatory Visit (HOSPITAL_COMMUNITY)
Admission: RE | Admit: 2021-01-08 | Discharge: 2021-01-08 | Disposition: A | Discharge status: Home / Self Care | Payer: Medicare Other | Source: Ambulatory Visit | Attending: Nurse Practitioner | Admitting: Nurse Practitioner

## 2021-01-08 ENCOUNTER — Other Ambulatory Visit: Payer: Self-pay

## 2021-01-08 DIAGNOSIS — K766 Portal hypertension: Secondary | ICD-10-CM | POA: Diagnosis not present

## 2021-01-08 DIAGNOSIS — K7460 Unspecified cirrhosis of liver: Secondary | ICD-10-CM | POA: Insufficient documentation

## 2021-01-08 DIAGNOSIS — R7989 Other specified abnormal findings of blood chemistry: Secondary | ICD-10-CM

## 2021-01-08 DIAGNOSIS — K7689 Other specified diseases of liver: Secondary | ICD-10-CM | POA: Diagnosis not present

## 2021-01-08 MED ORDER — GADOBUTROL 1 MMOL/ML IV SOLN
9.0000 mL | Freq: Once | INTRAVENOUS | Status: AC | PRN
  Administered 2021-01-08: 9 mL via INTRAVENOUS

## 2021-01-13 ENCOUNTER — Other Ambulatory Visit: Payer: Self-pay | Admitting: *Deleted

## 2021-01-13 ENCOUNTER — Telehealth: Payer: Self-pay | Admitting: Nurse Practitioner

## 2021-01-13 DIAGNOSIS — K7460 Unspecified cirrhosis of liver: Secondary | ICD-10-CM

## 2021-01-13 DIAGNOSIS — R601 Generalized edema: Secondary | ICD-10-CM

## 2021-01-13 DIAGNOSIS — R188 Other ascites: Secondary | ICD-10-CM

## 2021-01-13 NOTE — Telephone Encounter (Signed)
Inbound call from patient requesting a call from a nurse please.  States she had been experiencing rectal bleeding and cramping since last night.

## 2021-01-13 NOTE — Telephone Encounter (Signed)
Patient ate spinach and salmon yesterday. 6 to 7 hours later she developed stomach pain in the upper abdomen. She felt like she needed to have a bowel movement but was afraid to. She was afraid she would cause bleeding if she did. (Not clear why she thought this)  Her stomach cramped all night and she considered going to the ER. She began moving her bowels this morning and has gone x2. She denies diarrhea or constipation but thinks she had blood after the bowel movement because of how much she had to empty. Her abdomen is less painful now. Denies nausea. No further bleeding.  The paracentesis is 01/18/21 at [facility] as requested. Do you want her to get the labs now?

## 2021-01-14 ENCOUNTER — Other Ambulatory Visit (INDEPENDENT_AMBULATORY_CARE_PROVIDER_SITE_OTHER): Payer: Medicare Other

## 2021-01-14 DIAGNOSIS — U071 COVID-19: Secondary | ICD-10-CM | POA: Diagnosis not present

## 2021-01-14 DIAGNOSIS — K7460 Unspecified cirrhosis of liver: Secondary | ICD-10-CM | POA: Diagnosis not present

## 2021-01-14 DIAGNOSIS — R188 Other ascites: Secondary | ICD-10-CM | POA: Diagnosis not present

## 2021-01-14 LAB — BASIC METABOLIC PANEL
BUN: 16 mg/dL (ref 6–23)
CO2: 29 mEq/L (ref 19–32)
Calcium: 9.1 mg/dL (ref 8.4–10.5)
Chloride: 97 mEq/L (ref 96–112)
Creatinine, Ser: 0.74 mg/dL (ref 0.40–1.20)
GFR: 80.91 mL/min (ref >60.00)
Glucose, Bld: 121 mg/dL — ABNORMAL HIGH (ref 70–99)
Potassium: 3.6 mEq/L (ref 3.5–5.1)
Sodium: 132 mEq/L — ABNORMAL LOW (ref 135–145)

## 2021-01-14 LAB — CBC WITH DIFFERENTIAL/PLATELET
Basophils Absolute: 0 10*3/uL (ref 0.0–0.1)
Basophils Relative: 0 % (ref 0.0–3.0)
Eosinophils Absolute: 0 10*3/uL (ref 0.0–0.7)
Eosinophils Relative: 0.3 % (ref 0.0–5.0)
HCT: 36.8 % (ref 36.0–46.0)
Hemoglobin: 12.7 g/dL (ref 12.0–15.0)
Lymphocytes Relative: 6.8 % — ABNORMAL LOW (ref 12.0–46.0)
Lymphs Abs: 0.6 10*3/uL — ABNORMAL LOW (ref 0.7–4.0)
MCHC: 34.4 g/dL (ref 30.0–36.0)
MCV: 106.9 fl — ABNORMAL HIGH (ref 78.0–100.0)
Monocytes Absolute: 0.6 10*3/uL (ref 0.1–1.0)
Monocytes Relative: 6.8 % (ref 3.0–12.0)
Neutro Abs: 7.7 10*3/uL (ref 1.4–7.7)
Neutrophils Relative %: 86.1 % — ABNORMAL HIGH (ref 43.0–77.0)
Platelets: 96 10*3/uL — ABNORMAL LOW (ref 150.0–400.0)
RBC: 3.44 Mil/uL — ABNORMAL LOW (ref 3.87–5.11)
RDW: 15.1 % (ref 11.5–15.5)
WBC: 9 10*3/uL (ref 4.0–10.5)

## 2021-01-14 LAB — PROTIME-INR
INR: 1.7 ratio — ABNORMAL HIGH (ref 0.8–1.0)
Prothrombin Time: 18.6 s — ABNORMAL HIGH (ref 9.6–13.1)

## 2021-01-14 NOTE — Telephone Encounter (Signed)
Patient is going to get her labs today.

## 2021-01-18 ENCOUNTER — Ambulatory Visit (HOSPITAL_COMMUNITY)
Admission: RE | Admit: 2021-01-18 | Discharge: 2021-01-18 | Disposition: A | Discharge status: Home / Self Care | Payer: Medicare Other | Source: Ambulatory Visit | Attending: Internal Medicine | Admitting: Internal Medicine

## 2021-01-18 ENCOUNTER — Other Ambulatory Visit: Payer: Self-pay

## 2021-01-18 DIAGNOSIS — K7460 Unspecified cirrhosis of liver: Secondary | ICD-10-CM | POA: Diagnosis not present

## 2021-01-18 DIAGNOSIS — C50919 Malignant neoplasm of unspecified site of unspecified female breast: Secondary | ICD-10-CM | POA: Diagnosis not present

## 2021-01-18 DIAGNOSIS — R188 Other ascites: Secondary | ICD-10-CM | POA: Diagnosis not present

## 2021-01-18 LAB — BODY FLUID CELL COUNT WITH DIFFERENTIAL
Eos, Fluid: 0 %
Lymphs, Fluid: 63 %
Monocyte-Macrophage-Serous Fluid: 12 % — ABNORMAL LOW (ref 50–90)
Neutrophil Count, Fluid: 25 % (ref 0–25)
Total Nucleated Cell Count, Fluid: 107 cu mm (ref 0–1000)

## 2021-01-18 MED ORDER — LIDOCAINE HCL 1 % IJ SOLN
INTRAMUSCULAR | Status: AC
  Filled 2021-01-18: qty 20

## 2021-01-18 NOTE — Procedures (Signed)
Ultrasound-guided diagnostic and therapeutic paracentesis performed yielding 5 liters (maximum ordered) of hazy,amber  fluid. No immediate complications.  A portion of the fluid was submitted to the lab for preordered studies.EBL none.

## 2021-01-19 LAB — PATHOLOGIST SMEAR REVIEW

## 2021-01-22 LAB — BODY FLUID CULTURE W GRAM STAIN
Culture: NO GROWTH
Gram Stain: NONE SEEN

## 2021-01-27 DIAGNOSIS — H40003 Preglaucoma, unspecified, bilateral: Secondary | ICD-10-CM | POA: Diagnosis not present

## 2021-02-16 ENCOUNTER — Encounter: Payer: Self-pay | Admitting: Internal Medicine

## 2021-02-16 ENCOUNTER — Ambulatory Visit (AMBULATORY_SURGERY_CENTER): Payer: Self-pay | Admitting: Internal Medicine

## 2021-02-16 ENCOUNTER — Other Ambulatory Visit: Payer: Self-pay

## 2021-02-16 ENCOUNTER — Other Ambulatory Visit (INDEPENDENT_AMBULATORY_CARE_PROVIDER_SITE_OTHER): Payer: Medicare Other

## 2021-02-16 VITALS — BP 146/69 | HR 78 | Temp 98.0°F | Ht 65.0 in | Wt 207.0 lb

## 2021-02-16 DIAGNOSIS — K625 Hemorrhage of anus and rectum: Secondary | ICD-10-CM

## 2021-02-16 DIAGNOSIS — R188 Other ascites: Secondary | ICD-10-CM

## 2021-02-16 DIAGNOSIS — K7460 Unspecified cirrhosis of liver: Secondary | ICD-10-CM

## 2021-02-16 DIAGNOSIS — K7290 Hepatic failure, unspecified without coma: Secondary | ICD-10-CM

## 2021-02-16 DIAGNOSIS — Z86010 Personal history of colonic polyps: Secondary | ICD-10-CM

## 2021-02-16 LAB — COMPREHENSIVE METABOLIC PANEL
ALT: 22 U/L (ref 0–35)
AST: 42 U/L — ABNORMAL HIGH (ref 0–37)
Albumin: 3.2 g/dL — ABNORMAL LOW (ref 3.5–5.2)
Alkaline Phosphatase: 98 U/L (ref 39–117)
BUN: 10 mg/dL (ref 6–23)
CO2: 27 mEq/L (ref 19–32)
Calcium: 9.3 mg/dL (ref 8.4–10.5)
Chloride: 95 mEq/L — ABNORMAL LOW (ref 96–112)
Creatinine, Ser: 0.62 mg/dL (ref 0.40–1.20)
GFR: 89 mL/min (ref >60.00)
Glucose, Bld: 87 mg/dL (ref 70–99)
Potassium: 4 mEq/L (ref 3.5–5.1)
Sodium: 131 mEq/L — ABNORMAL LOW (ref 135–145)
Total Bilirubin: 4.5 mg/dL — ABNORMAL HIGH (ref 0.2–1.2)
Total Protein: 6.6 g/dL (ref 6.0–8.3)

## 2021-02-16 LAB — BASIC METABOLIC PANEL
BUN: 10 mg/dL (ref 6–23)
CO2: 27 mEq/L (ref 19–32)
Calcium: 9.3 mg/dL (ref 8.4–10.5)
Chloride: 95 mEq/L — ABNORMAL LOW (ref 96–112)
Creatinine, Ser: 0.62 mg/dL (ref 0.40–1.20)
GFR: 89 mL/min (ref >60.00)
Glucose, Bld: 87 mg/dL (ref 70–99)
Potassium: 4 mEq/L (ref 3.5–5.1)
Sodium: 131 mEq/L — ABNORMAL LOW (ref 135–145)

## 2021-02-16 LAB — CBC
HCT: 39.1 % (ref 36.0–46.0)
Hemoglobin: 13.5 g/dL (ref 12.0–15.0)
MCHC: 34.4 g/dL (ref 30.0–36.0)
MCV: 107 fl — ABNORMAL HIGH (ref 78.0–100.0)
Platelets: 131 10*3/uL — ABNORMAL LOW (ref 150.0–400.0)
RBC: 3.66 Mil/uL — ABNORMAL LOW (ref 3.87–5.11)
RDW: 13.8 % (ref 11.5–15.5)
WBC: 6.3 10*3/uL (ref 4.0–10.5)

## 2021-02-16 LAB — MAGNESIUM: Magnesium: 1.6 mg/dL (ref 1.5–2.5)

## 2021-02-16 MED ORDER — SODIUM CHLORIDE 0.9 % IV SOLN: 500.0000 mL | Freq: Once | INTRAVENOUS | Status: DC

## 2021-02-16 NOTE — Progress Notes (Signed)
[facility] History and Physical   Primary Care Physician:  Tower, [name], MD   Reason for Procedure:   Rectal bleeding and hx colon polyps  Plan:    Colonoscopy - CANCELLED DUE TO VOLUME OVERLOAD AND ASCITES     HPI: Selena Johnson is a 70 y.o. female w/ cirrhosis and ascites/edema scheduled for colonoscopy due to recent bleeding and hx polyps.    Wt Readings from Last 3 Encounters:  02/16/21 207 lb (93.9 kg)  01/06/21 207 lb (93.9 kg)  12/28/20 206 lb 6.4 oz (93.6 kg)     Past Medical History:  Diagnosis Date   Allergy    Cancer ([place])    basal cell skin CA   Chronic kidney disease    hx of kidney stones   Conjunctivitis    chronic   Fatty liver 2015   GERD (gastroesophageal reflux disease)    Glaucoma    Hearing loss in left ear    History of kidney stones    Hyperlipidemia    no per pt   Hypertension    Menopausal syndrome    NASH (nonalcoholic steatohepatitis)    with cirrhosis and mild esoph varicies   Primary hypothyroidism     Past Surgical History:  Procedure Laterality Date   ABDOMINAL HYSTERECTOMY     partial ? prolapse   BREAST LUMPECTOMY WITH RADIOACTIVE SEED AND SENTINEL LYMPH NODE BIOPSY Left 07/12/2018   Procedure: LEFT BREAST LUMPECTOMY WITH RADIOACTIVE SEED AND LEFT AXILLARY SENTINEL LYMPH NODE BIOPSY;  Surgeon: Rolm Bookbinder, MD;  Location: [facility];  Service: General;  Laterality: Left;   CHOLECYSTECTOMY     COLONOSCOPY     ESOPHAGOGASTRODUODENOSCOPY     IR PARACENTESIS  12/09/2020   TONSILLECTOMY     UPPER GASTROINTESTINAL ENDOSCOPY      Prior to Admission medications   Medication Sig Start Date End Date Taking? Authorizing Provider  anastrozole (ARIMIDEX) 1 MG tablet Take 1 tablet by mouth daily Patient taking differently: Take 1 mg by mouth daily. 09/08/20  Yes Magrinat, Virgie Dad, MD  Biotin 2500 MCG CAPS Take 5,000 mcg by mouth daily.   Yes [provider]  Cholecalciferol (VITAMIN D3) 2000 units TABS Take  2,000 Units by mouth daily.   Yes [provider]  furosemide (LASIX) 20 MG tablet Take 3 tablets (60 mg total) by mouth daily. 01/06/21  Yes Willia Craze, NP  levothyroxine (SYNTHROID) 200 MCG tablet Take 1 tablet (200 mcg total) by mouth daily before breakfast. 10/08/20  Yes Tower, [name], MD  loratadine (CLARITIN) 10 MG tablet Take 10 mg by mouth daily.   Yes [provider]  metoprolol succinate (TOPROL-XL) 100 MG 24 hr tablet Take 1 by mouth daily with or after a meal Patient taking differently: Take 100 mg by mouth daily. 09/07/20  Yes Tower, [name], MD  Polyvinyl Alcohol-Povidone PF (REFRESH) 1.4-0.6 % SOLN Place 1 drop into both eyes daily as needed (dry eyes).   Yes [provider]  potassium chloride SA (KLOR-CON) 20 MEQ tablet Take 1 tablet by mouth every day Patient taking differently: Take 20 mEq by mouth daily. 09/07/20  Yes Tower, [name], MD  spironolactone (ALDACTONE) 50 MG tablet Take 2 tablets (100 mg total) by mouth daily. 01/06/21  Yes Willia Craze, NP    Current Outpatient Medications  Medication Sig Dispense Refill   anastrozole (ARIMIDEX) 1 MG tablet Take 1 tablet by mouth daily (Patient taking differently: Take 1 mg by  mouth daily.) 90 tablet 3   Biotin 2500 MCG CAPS Take 5,000 mcg by mouth daily.     Cholecalciferol (VITAMIN D3) 2000 units TABS Take 2,000 Units by mouth daily.     furosemide (LASIX) 20 MG tablet Take 3 tablets (60 mg total) by mouth daily. 90 tablet 1   levothyroxine (SYNTHROID) 200 MCG tablet Take 1 tablet (200 mcg total) by mouth daily before breakfast. 15 tablet 0   loratadine (CLARITIN) 10 MG tablet Take 10 mg by mouth daily.     metoprolol succinate (TOPROL-XL) 100 MG 24 hr tablet Take 1 by mouth daily with or after a meal (Patient taking differently: Take 100 mg by mouth daily.) 90 tablet 3   Polyvinyl Alcohol-Povidone PF (REFRESH) 1.4-0.6 % SOLN Place 1 drop into both eyes daily as needed (dry eyes).     potassium  chloride SA (KLOR-CON) 20 MEQ tablet Take 1 tablet by mouth every day (Patient taking differently: Take 20 mEq by mouth daily.) 90 tablet 3   spironolactone (ALDACTONE) 50 MG tablet Take 2 tablets (100 mg total) by mouth daily. 60 tablet 1   Current Facility-Administered Medications  Medication Dose Route Frequency Provider Last Rate Last Admin   0.9 %  sodium chloride infusion  500 mL Intravenous Once Gatha Mayer, MD        Allergies as of 02/16/2021 - Review Complete 02/16/2021  Allergen Reaction Noted   Codeine  10/11/2006   Ivp dye [iodinated diagnostic agents] Swelling 11/10/2010    Family History  Problem Relation Age of Onset   Heart disease Mother        CAD   Hypertension Mother    Glaucoma Mother    Heart disease Father        CAD   Diabetes Father    Hypertension Father    Cancer Father        ? CA - squamous cell carcinoma   Colon cancer Neg Hx    Esophageal cancer Neg Hx    Rectal cancer Neg Hx    Stomach cancer Neg Hx        Review of Systems:  All other review of systems negative except as mentioned in the HPI.  Physical Exam: Vital signs BP (!) 146/69   Pulse 78   Temp 98 F (36.7 C) (Temporal)   Ht 5' 5"  (1.651 m)   Wt 207 lb (93.9 kg)   SpO2 100%   BMI 34.45 kg/m   General:   Alert,  Well-developed,  pleasant and cooperative in NAD Lungs:  Clear throughout to auscultation.   Cor NL S1S2 no rmg Abd - large ascites and umbilical hernia - nontender Ext 2 + edema throughout - anasarca   @[name]  [name], MD, [facility] [phone] (pager) 02/16/2021 3:59 PM@

## 2021-02-16 NOTE — Patient Instructions (Signed)
AS YOU KNOW I CANCELLED THE COLONOSCOPY DUE TO THE FLUID RETENTION AND ASSOCIATED RISKS WITH SEDATION.  TO LAB TODAY AND WE WILL CALL AND CHANGE MEDICATIONS AND ARRANGE FLUID DRAINAGE ALSO     Gatha Mayer, MD, [facility]

## 2021-02-16 NOTE — Progress Notes (Signed)
VS completed by DT.    Medical history reviewed and updated.  

## 2021-02-16 NOTE — Progress Notes (Signed)
Pt. Arrived to recovery un-sedated procedure canceled,see physician progress note. Pt. Ambulatory to obtain lab work.

## 2021-02-17 ENCOUNTER — Other Ambulatory Visit: Payer: Self-pay

## 2021-02-17 DIAGNOSIS — K7460 Unspecified cirrhosis of liver: Secondary | ICD-10-CM

## 2021-02-17 DIAGNOSIS — R188 Other ascites: Secondary | ICD-10-CM

## 2021-02-17 LAB — PROTIME-INR
INR: 1.5 ratio — ABNORMAL HIGH (ref 0.8–1.0)
Prothrombin Time: 16.6 s — ABNORMAL HIGH (ref 9.6–13.1)

## 2021-02-17 MED ORDER — ALBUMIN HUMAN 25 % IV SOLN
50.0000 g | Freq: Once | INTRAVENOUS | Status: AC
  Administered 2021-02-22: 50 g via INTRAVENOUS

## 2021-02-17 MED ORDER — ALBUMIN HUMAN 25 % IV SOLN: 50.0000 g | Freq: Once | INTRAVENOUS | Status: DC

## 2021-02-17 MED ORDER — ALBUMIN HUMAN 25 % IV SOLN: 50.0000 g | Freq: Once | INTRAVENOUS | Status: AC

## 2021-02-17 MED ORDER — SPIRONOLACTONE 50 MG PO TABS: 150.0000 mg | ORAL_TABLET | Freq: Every day | ORAL | 3 refills | Status: DC

## 2021-02-17 NOTE — Addendum Note (Signed)
Addended by: Hardie Pulley, Tiera Mensinger J on: 02/17/2021 03:39 PM   Modules accepted: Orders

## 2021-02-22 ENCOUNTER — Other Ambulatory Visit: Payer: Self-pay

## 2021-02-22 ENCOUNTER — Ambulatory Visit (HOSPITAL_COMMUNITY)
Admission: RE | Admit: 2021-02-22 | Discharge: 2021-02-22 | Disposition: A | Discharge status: Home / Self Care | Payer: Medicare Other | Source: Ambulatory Visit | Attending: Internal Medicine | Admitting: Internal Medicine

## 2021-02-22 DIAGNOSIS — K7460 Unspecified cirrhosis of liver: Secondary | ICD-10-CM

## 2021-02-22 DIAGNOSIS — K7469 Other cirrhosis of liver: Secondary | ICD-10-CM | POA: Diagnosis not present

## 2021-02-22 DIAGNOSIS — R188 Other ascites: Secondary | ICD-10-CM | POA: Insufficient documentation

## 2021-02-22 HISTORY — PX: IR PARACENTESIS: IMG2679

## 2021-02-22 MED ORDER — ALBUMIN HUMAN 25 % IV SOLN
INTRAVENOUS | Status: AC
  Filled 2021-02-22: qty 200

## 2021-02-22 MED ORDER — ALBUMIN HUMAN 25 % IV SOLN
50.0000 g | Freq: Once | INTRAVENOUS | Status: DC
  Filled 2021-02-22: qty 200

## 2021-02-22 MED ORDER — LIDOCAINE HCL 1 % IJ SOLN
INTRAMUSCULAR | Status: AC
  Administered 2021-02-22: 10 mL via INTRADERMAL
  Filled 2021-02-22: qty 20

## 2021-02-22 NOTE — Procedures (Signed)
Ultrasound-guided herapeutic paracentesis performed yielding 6.7 liters of cloudy yellow colored fluid.  No immediate complications. EBL is none.

## 2021-03-01 ENCOUNTER — Ambulatory Visit (HOSPITAL_COMMUNITY)
Admission: RE | Admit: 2021-03-01 | Discharge: 2021-03-01 | Disposition: A | Discharge status: Home / Self Care | Payer: Medicare Other | Source: Ambulatory Visit | Attending: Internal Medicine | Admitting: Internal Medicine

## 2021-03-01 ENCOUNTER — Telehealth: Payer: Self-pay | Admitting: Family Medicine

## 2021-03-01 ENCOUNTER — Other Ambulatory Visit: Payer: Self-pay

## 2021-03-01 DIAGNOSIS — R188 Other ascites: Secondary | ICD-10-CM | POA: Diagnosis not present

## 2021-03-01 DIAGNOSIS — K7460 Unspecified cirrhosis of liver: Secondary | ICD-10-CM | POA: Diagnosis not present

## 2021-03-01 HISTORY — PX: IR PARACENTESIS: IMG2679

## 2021-03-01 MED ORDER — LIDOCAINE HCL (PF) 1 % IJ SOLN
INTRAMUSCULAR | Status: DC | PRN
  Administered 2021-03-01: 10 mL

## 2021-03-01 MED ORDER — ALBUMIN HUMAN 25 % IV SOLN
50.0000 g | Freq: Once | INTRAVENOUS | Status: AC
  Filled 2021-03-01: qty 200

## 2021-03-01 MED ORDER — LIDOCAINE HCL 1 % IJ SOLN
INTRAMUSCULAR | Status: AC
  Filled 2021-03-01: qty 20

## 2021-03-01 MED ORDER — ALBUMIN HUMAN 25 % IV SOLN
INTRAVENOUS | Status: AC
  Administered 2021-03-01: 50 g via INTRAVENOUS
  Filled 2021-03-01: qty 200

## 2021-03-01 NOTE — Procedures (Signed)
PROCEDURE SUMMARY:  Successful US guided paracentesis from LLQ.  Yielded 8L of yellow fluid.  No immediate complications.  Pt tolerated well.   Specimen not sent for labs.  EBL < 73m  Fronia Depass PA-C 03/01/2021 10:53 AM

## 2021-03-01 NOTE — Telephone Encounter (Signed)
Pt dropped off Disability Parking placard at [facility] location to be filled out. Will send to [place] via interoffice.

## 2021-03-02 ENCOUNTER — Other Ambulatory Visit: Payer: Self-pay | Admitting: Internal Medicine

## 2021-03-02 DIAGNOSIS — K7460 Unspecified cirrhosis of liver: Secondary | ICD-10-CM

## 2021-03-03 NOTE — Telephone Encounter (Signed)
Never received forms, will route back to Raquel Sarna so she is aware

## 2021-03-04 ENCOUNTER — Ambulatory Visit: Payer: Medicare Other | Admitting: Vascular Surgery

## 2021-03-04 NOTE — Telephone Encounter (Signed)
I have not seen it yet, thanks

## 2021-03-05 ENCOUNTER — Other Ambulatory Visit (INDEPENDENT_AMBULATORY_CARE_PROVIDER_SITE_OTHER): Payer: Medicare Other

## 2021-03-05 DIAGNOSIS — K7460 Unspecified cirrhosis of liver: Secondary | ICD-10-CM | POA: Diagnosis not present

## 2021-03-05 DIAGNOSIS — R188 Other ascites: Secondary | ICD-10-CM | POA: Diagnosis not present

## 2021-03-05 LAB — BASIC METABOLIC PANEL
BUN: 30 mg/dL — ABNORMAL HIGH (ref 6–23)
CO2: 24 mEq/L (ref 19–32)
Calcium: 9.4 mg/dL (ref 8.4–10.5)
Chloride: 94 mEq/L — ABNORMAL LOW (ref 96–112)
Creatinine, Ser: 0.96 mg/dL (ref 0.40–1.20)
GFR: 59.15 mL/min — ABNORMAL LOW (ref >60.00)
Glucose, Bld: 79 mg/dL (ref 70–99)
Potassium: 4.5 mEq/L (ref 3.5–5.1)
Sodium: 128 mEq/L — ABNORMAL LOW (ref 135–145)

## 2021-03-08 ENCOUNTER — Other Ambulatory Visit: Payer: Self-pay

## 2021-03-08 ENCOUNTER — Ambulatory Visit (HOSPITAL_COMMUNITY)
Admission: RE | Admit: 2021-03-08 | Discharge: 2021-03-08 | Disposition: A | Discharge status: Home / Self Care | Payer: Medicare Other | Source: Ambulatory Visit | Attending: Internal Medicine | Admitting: Internal Medicine

## 2021-03-08 DIAGNOSIS — R188 Other ascites: Secondary | ICD-10-CM | POA: Diagnosis not present

## 2021-03-08 DIAGNOSIS — K7460 Unspecified cirrhosis of liver: Secondary | ICD-10-CM | POA: Insufficient documentation

## 2021-03-08 HISTORY — PX: IR PARACENTESIS: IMG2679

## 2021-03-08 MED ORDER — ALBUMIN HUMAN 25 % IV SOLN
INTRAVENOUS | Status: AC
  Filled 2021-03-08: qty 200

## 2021-03-08 MED ORDER — LIDOCAINE HCL (PF) 1 % IJ SOLN
INTRAMUSCULAR | Status: DC | PRN
  Administered 2021-03-08: 10 mL

## 2021-03-08 MED ORDER — ALBUMIN HUMAN 25 % IV SOLN
50.0000 g | Freq: Once | INTRAVENOUS | Status: AC
  Administered 2021-03-08: 50 g via INTRAVENOUS
  Filled 2021-03-08: qty 200

## 2021-03-08 NOTE — Procedures (Signed)
PROCEDURE SUMMARY:  Successful image-guided paracentesis from the left lower abdomen.  Yielded 8.0 liters of hazy yellow fluid.  No immediate complications.  EBL < 1 mL Patient tolerated well.   Specimen was not sent for labs.  Please see imaging section of Epic for full dictation.  Joaquim Nam PA-C 03/08/2021 9:21 AM

## 2021-03-12 ENCOUNTER — Other Ambulatory Visit: Payer: Self-pay | Admitting: Family Medicine

## 2021-03-15 NOTE — Telephone Encounter (Signed)
Pt called in wanting to know the status of Parking Placard form .  Would like a call back [phone]

## 2021-03-16 ENCOUNTER — Encounter: Payer: Self-pay | Admitting: Oncology

## 2021-03-16 DIAGNOSIS — Z853 Personal history of malignant neoplasm of breast: Secondary | ICD-10-CM | POA: Diagnosis not present

## 2021-03-16 DIAGNOSIS — R922 Inconclusive mammogram: Secondary | ICD-10-CM | POA: Diagnosis not present

## 2021-03-16 NOTE — Telephone Encounter (Signed)
Pt called back and requested form to be mailed. done

## 2021-03-16 NOTE — Telephone Encounter (Signed)
Dr. Glori Bickers filled out form and it's ready for pick up, not sure if pt wants to pick it up for [place] or have [place] mail it.   Called both #'s on file and no answer so left VM requesting pt to call back on both #s

## 2021-03-17 ENCOUNTER — Inpatient Hospital Stay: Payer: Medicare Other

## 2021-03-17 ENCOUNTER — Telehealth: Payer: Self-pay | Admitting: *Deleted

## 2021-03-17 ENCOUNTER — Inpatient Hospital Stay: Payer: Medicare Other | Attending: Oncology | Admitting: Oncology

## 2021-03-17 ENCOUNTER — Other Ambulatory Visit: Payer: Self-pay

## 2021-03-17 VITALS — BP 150/64 | HR 69 | Temp 97.6°F | Resp 18 | Ht 65.0 in | Wt 180.1 lb

## 2021-03-17 DIAGNOSIS — C50912 Malignant neoplasm of unspecified site of left female breast: Secondary | ICD-10-CM | POA: Insufficient documentation

## 2021-03-17 DIAGNOSIS — C50212 Malignant neoplasm of upper-inner quadrant of left female breast: Secondary | ICD-10-CM | POA: Diagnosis not present

## 2021-03-17 DIAGNOSIS — Z170 Estrogen receptor positive status [ER+]: Secondary | ICD-10-CM

## 2021-03-17 DIAGNOSIS — Z79811 Long term (current) use of aromatase inhibitors: Secondary | ICD-10-CM | POA: Insufficient documentation

## 2021-03-17 DIAGNOSIS — K7460 Unspecified cirrhosis of liver: Secondary | ICD-10-CM

## 2021-03-17 DIAGNOSIS — R188 Other ascites: Secondary | ICD-10-CM

## 2021-03-17 LAB — COMPREHENSIVE METABOLIC PANEL
ALT: 28 U/L (ref 0–44)
AST: 46 U/L — ABNORMAL HIGH (ref 15–41)
Albumin: 2.8 g/dL — ABNORMAL LOW (ref 3.5–5.0)
Alkaline Phosphatase: 164 U/L — ABNORMAL HIGH (ref 38–126)
Anion gap: 8 (ref 5–15)
BUN: 44 mg/dL — ABNORMAL HIGH (ref 8–23)
CO2: 26 mmol/L (ref 22–32)
Calcium: 9.2 mg/dL (ref 8.9–10.3)
Chloride: 95 mmol/L — ABNORMAL LOW (ref 98–111)
Creatinine, Ser: 1.12 mg/dL — ABNORMAL HIGH (ref 0.44–1.00)
GFR, Estimated: 52 mL/min — ABNORMAL LOW (ref >60)
Glucose, Bld: 105 mg/dL — ABNORMAL HIGH (ref 70–99)
Potassium: 5.4 mmol/L — ABNORMAL HIGH (ref 3.5–5.1)
Sodium: 129 mmol/L — ABNORMAL LOW (ref 135–145)
Total Bilirubin: 5.7 mg/dL (ref 0.3–1.2)
Total Protein: 6.5 g/dL (ref 6.5–8.1)

## 2021-03-17 LAB — CBC WITH DIFFERENTIAL/PLATELET
Abs Immature Granulocytes: 0.27 10*3/uL — ABNORMAL HIGH (ref 0.00–0.07)
Basophils Absolute: 0 10*3/uL (ref 0.0–0.1)
Basophils Relative: 0 %
Eosinophils Absolute: 0.1 10*3/uL (ref 0.0–0.5)
Eosinophils Relative: 1 %
HCT: 33.5 % — ABNORMAL LOW (ref 36.0–46.0)
Hemoglobin: 11.9 g/dL — ABNORMAL LOW (ref 12.0–15.0)
Immature Granulocytes: 2 %
Lymphocytes Relative: 5 %
Lymphs Abs: 0.8 10*3/uL (ref 0.7–4.0)
MCH: 36.5 pg — ABNORMAL HIGH (ref 26.0–34.0)
MCHC: 35.5 g/dL (ref 30.0–36.0)
MCV: 102.8 fL — ABNORMAL HIGH (ref 80.0–100.0)
Monocytes Absolute: 1.5 10*3/uL — ABNORMAL HIGH (ref 0.1–1.0)
Monocytes Relative: 9 %
Neutro Abs: 13.6 10*3/uL — ABNORMAL HIGH (ref 1.7–7.7)
Neutrophils Relative %: 83 %
Platelets: 175 10*3/uL (ref 150–400)
RBC: 3.26 MIL/uL — ABNORMAL LOW (ref 3.87–5.11)
RDW: 15.3 % (ref 11.5–15.5)
WBC: 16.2 10*3/uL — ABNORMAL HIGH (ref 4.0–10.5)
nRBC: 0 % (ref 0.0–0.2)

## 2021-03-17 MED ORDER — FUROSEMIDE 20 MG PO TABS: 40.0000 mg | ORAL_TABLET | Freq: Every day | ORAL | 1 refills | Status: DC

## 2021-03-17 NOTE — Telephone Encounter (Signed)
Critical value received T bili 5.7.  Message given to Marlon Pel, RN with Dr Jana Hakim at [phone].

## 2021-03-17 NOTE — Telephone Encounter (Signed)
This RN received abnormal labs including T bili of 5.7 - informed MD who is seeing pt at scheduled visit today.

## 2021-03-17 NOTE — Progress Notes (Signed)
[facility]  [phone] [phone]     ID: Selena Johnson DOB: 23-Apr-1951  MR#: [id number]  XIP#:[id number]  Patient Care Team: Abner Greenspan, MD as PCP - General Vin-Parikh, Deirdre Peer, MD as Referring Physician (Ophthalmology) Glennie Isle, PA-C as Physician Assistant (Physician Assistant) Olie Dibert, Virgie Dad, MD as Consulting Physician (Oncology) Rolm Bookbinder, MD as Consulting Physician (General Surgery) Kyung Rudd, MD as Consulting Physician (Radiation Oncology) Druscilla Brownie, MD as Referring Physician (Dermatology) Mauro Kaufmann, RN as Oncology Nurse Navigator Rockwell Germany, RN as Oncology Nurse Navigator OTHER MD:    CHIEF COMPLAINT: Estrogen receptor positive breast cancer  CURRENT TREATMENT: Anastrozole   INTERVAL HISTORY: Selena Johnson returns today for follow-up of her estrogen receptor positive breast cancer.  She continues on anastrozole.  She tolerates this well with no unusual side effects.  She had her yearly mammogram at [facility] 03/16/2021.  This showed breast density category C.  There was no evidence of malignancy.  Selena Johnson's last bone density screening on 07/30/2015, showed a T-score of -0.3, which is considered normal.  Repeat 03/11/2020 showed a T score of -0.1, which is normal.   REVIEW OF SYSTEMS: Selena Johnson is going through a rough patch.  Her husband died in 12/23/22.  She is still trying to settle his estate and work out some [employer] issues.  In addition she has been diagnosed with cirrhosis secondary to [place].  She has significant fluid issues which are being managed by Dr. Arelia Longest.  She just underwent paracentesis and is generally very uncomfortable.  She really does not have anyone to help at home and she has been unable to get such simple things done as getting a handicap sticker.   COVID 19 VACCINATION STATUS: Pfizer x4, most recently 09/2020   HISTORY OF CURRENT ILLNESS: From the original intake  note:  Selena Johnson underwent routine screening mammography at [facility] on 02/28/2018 showing an irregularity in the left breast. On 03/02/2018, she followed up with a unilateral left diagnostic mammogram and unilateral left ultrasound where a biopsy was recommended. A biopsy was performed on 03/07/2018. Pathology from the procedure ([id number]) showed benign breast tissue with no specific histopathologic changes, negative for carcinoma. A three month follow up was recommended.  At her follow up, she underwent bilateral diagnostic mammography with tomography and left breast ultrasonography at [facility] on 06/12/2018 showing: Breast Density Category C. There os a 0.7 cm oval mass in the left breast at 9 o'clock posterior depth 9 cm from the nipple. There is a questioned mild architectural distortion associated with the mass. Biopsy clip is noted 1 cm medial and 1.7 cm inferior to the mass. No other significant masses or calcifications are seen in the breast. On ultrasound, there is a 0.9 x 0.7 cm oval mass with an angular margin in the left breast at 9:30 o'clock 8 cm from the nipple. Color flow imaging demonstrates that there is vascularity present. Elastography imaging assessment is soft. No definite biopsy clip is associated with this mass. This mass corresponds to the mammographic mass. No significant abnormalities were seen sonographically in the left axilla.   Accordingly on 06/20/2018 she proceeded to biopsy of the left breast area in question. The pathology from this procedure showed ([id number]): invasive ductal carcinoma, grade I. Prognostic indicators significant for: estrogen receptor, 100% positive and progesterone receptor, 20% positive, both with strong staining intensity. Proliferation marker Ki67 at 3%. HER2 negative (1+) by immunohistochemistry.  The patient's subsequent history is as detailed below.  PAST MEDICAL HISTORY: Past Medical History:  Diagnosis Date   Allergy    Cancer ([place])    basal  cell skin CA   Chronic kidney disease    hx of kidney stones   Conjunctivitis    chronic   Fatty liver 2015   GERD (gastroesophageal reflux disease)    Glaucoma    Hearing loss in left ear    History of kidney stones    Hyperlipidemia    no per pt   Hypertension    Menopausal syndrome    NASH (nonalcoholic steatohepatitis)    with cirrhosis and mild esoph varicies   Primary hypothyroidism     PAST SURGICAL HISTORY: Past Surgical History:  Procedure Laterality Date   ABDOMINAL HYSTERECTOMY     partial ? prolapse   BREAST LUMPECTOMY WITH RADIOACTIVE SEED AND SENTINEL LYMPH NODE BIOPSY Left 07/12/2018   Procedure: LEFT BREAST LUMPECTOMY WITH RADIOACTIVE SEED AND LEFT AXILLARY SENTINEL LYMPH NODE BIOPSY;  Surgeon: Rolm Bookbinder, MD;  Location: [facility];  Service: General;  Laterality: Left;   CHOLECYSTECTOMY     COLONOSCOPY     ESOPHAGOGASTRODUODENOSCOPY     IR PARACENTESIS  12/09/2020   IR PARACENTESIS  02/22/2021   IR PARACENTESIS  03/01/2021   IR PARACENTESIS  03/08/2021   TONSILLECTOMY     UPPER GASTROINTESTINAL ENDOSCOPY      FAMILY HISTORY: Family History  Problem Relation Age of Onset   Heart disease Mother        CAD   Hypertension Mother    Glaucoma Mother    Heart disease Father        CAD   Diabetes Father    Hypertension Father    Cancer Father        ? CA - squamous cell carcinoma   Colon cancer Neg Hx    Esophageal cancer Neg Hx    Rectal cancer Neg Hx    Stomach cancer Neg Hx   Selena Johnson's father died from squamous cell cancer of the lung at age 71. Patients' mother died from stroke and sepsis at age 15. The patient has 1 brother and 1 sister. Her brother had advanced renal cell CA and passed away in 2020/01/31. Patient denies anyone in her family having breast, ovarian, prostate, or pancreatic cancer.    GYNECOLOGIC HISTORY:  No LMP recorded. Patient has had a hysterectomy. Menarche: 70 years old GXP: 0 LMP:  Contraceptive: yes; 1610-9604 HRT: yes;  20 years, stopped in 1994  Hysterectomy?: yes BSO?: no   SOCIAL HISTORY: (Updated October 2022). 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  He was a little older than she and she wass primary caregiver.  He died from cancer in November 30, 2020.  Elaina has no birth children and one deceased step-child. Sybrina has no grandchildren. She attends a [employer].   ADVANCED DIRECTIVES: The patient's friend Lowry Bowl is her healthcare power of attorney.  Malachy Mood can be reached at [phone]   HEALTH MAINTENANCE: Social History   Tobacco Use   Smoking status: Former    Types: Cigarettes    Quit date: 06/06/1982    Years since quitting: 38.8   Smokeless tobacco: Never  Vaping Use   Vaping Use: Never used  Substance Use Topics   Alcohol use: Yes    Alcohol/week: 0.0 standard drinks    Comment: wine- rare   Drug use: No    Colonoscopy: yes, Nandigam  PAP:  Bone density: Oct 2021, T = -0.1   Allergies  Allergen Reactions   Codeine     REACTION: Throat swelling   Ivp Dye [Iodinated Diagnostic Agents] Swelling    Current Outpatient Medications  Medication Sig Dispense Refill   anastrozole (ARIMIDEX) 1 MG tablet Take 1 tablet by mouth daily (Patient taking differently: Take 1 mg by mouth daily.) 90 tablet 3   Biotin 2500 MCG CAPS Take 5,000 mcg by mouth daily.     Cholecalciferol (VITAMIN D3) 2000 units TABS Take 2,000 Units by mouth daily.     furosemide (LASIX) 20 MG tablet Take 2 tablets (40 mg total) by mouth daily. 90 tablet 1   levothyroxine (SYNTHROID) 200 MCG tablet Take 1 tablet (200 mcg total) by mouth daily before breakfast. 15 tablet 0   loratadine (CLARITIN) 10 MG tablet Take 10 mg by mouth daily.     metoprolol succinate (TOPROL-XL) 100 MG 24 hr tablet Take 1 by mouth daily with or after a meal (Patient taking differently: Take 100 mg by mouth daily.) 90 tablet 3   Polyvinyl Alcohol-Povidone PF  (REFRESH) 1.4-0.6 % SOLN Place 1 drop into both eyes daily as needed (dry eyes).     potassium chloride SA (KLOR-CON) 20 MEQ tablet Take 1 tablet by mouth every day (Patient taking differently: Take 20 mEq by mouth daily.) 90 tablet 3   spironolactone (ALDACTONE) 50 MG tablet Take 2 tablets (100 mg total) by mouth daily. 90 tablet 3   Current Facility-Administered Medications  Medication Dose Route Frequency Provider Last Rate Last Admin   albumin human 25 % solution 50 g  50 g Intravenous Once Gatha Mayer, MD       albumin human 25 % solution 50 g  50 g Intravenous Once Gatha Mayer, MD        OBJECTIVE: White woman who appears older than stated age  18:   03/17/21 1419  BP: (!) 150/64  Pulse: 69  Resp: 18  Temp: 97.6 F (36.4 C)  SpO2: 100%    Wt Readings from Last 3 Encounters:  03/17/21 180 lb 1.6 oz (81.7 kg)  02/16/21 207 lb (93.9 kg)  01/06/21 207 lb (93.9 kg)   Body mass index is 29.97 kg/m.    ECOG FS:1 - Symptomatic but completely ambulatory  No obvious scleral icterus despite the elevated bilirubin Wearing a mask No cervical or supraclavicular adenopathy Lungs no rales or rhonchi Heart regular rate and rhythm Abd distended, nontender, positive bowel sounds MSK no focal spinal tenderness, grade 2/3 lower extremity lymphedema Neuro: nonfocal, well oriented, appropriate affect Breasts: Right breast is unremarkable.  The left breast is status postlumpectomy and radiation.  There is no evidence of local recurrence.  Both axillae are benign   LAB RESULTS:  CMP     Component Value Date/Time   NA 129 (L) 03/17/2021 1344   K 5.4 (H) 03/17/2021 1344   CL 95 (L) 03/17/2021 1344   CO2 26 03/17/2021 1344   GLUCOSE 105 (H) 03/17/2021 1344   BUN 44 (H) 03/17/2021 1344   CREATININE 1.12 (H) 03/17/2021 1344   CREATININE 0.73 07/04/2018 0823   CALCIUM 9.2 03/17/2021 1344   PROT 6.5 03/17/2021 1344   ALBUMIN 2.8 (L) 03/17/2021 1344   AST 46 (H) 03/17/2021  1344   AST 53 (H) 07/04/2018 0823   ALT 28 03/17/2021 1344   ALT 36 07/04/2018 0823   ALKPHOS 164 (H) 03/17/2021 1344   BILITOT 5.7 ([place]) 03/17/2021 1344  BILITOT 2.2 (H) 07/04/2018 0823   GFRNONAA 52 (L) 03/17/2021 1344   GFRNONAA >60 07/04/2018 0823   GFRAA >60 02/04/2019 1330   GFRAA >60 07/04/2018 0823    No results found for: TOTALPROTELP, ALBUMINELP, A1GS, A2GS, BETS, BETA2SER, GAMS, MSPIKE, SPEI  No results found for: KPAFRELGTCHN, LAMBDASER, KAPLAMBRATIO  Lab Results  Component Value Date   WBC 16.2 (H) 03/17/2021   NEUTROABS 13.6 (H) 03/17/2021   HGB 11.9 (L) 03/17/2021   HCT 33.5 (L) 03/17/2021   MCV 102.8 (H) 03/17/2021   PLT 175 03/17/2021   No results found for: LABCA2  No components found for: IPJASN053  No results for input(s): INR in the last 168 hours.  No results found for: LABCA2  No results found for: ZJQ734  No results found for: LPF790  No results found for: WIO973  No results found for: CA2729  No components found for: HGQUANT  No results found for: CEA1 / No results found for: CEA1   No results found for: AFPTUMOR  No results found for: CHROMOGRNA  No results found for: HGBA, HGBA2QUANT, HGBFQUANT, HGBSQUAN (Hemoglobinopathy evaluation)   No results found for: LDH  Lab Results  Component Value Date   IRON 181 (H) 12/01/2020   (Iron and TIBC)  Lab Results  Component Value Date   FERRITIN 481.5 (H) 12/01/2020    Urinalysis    Component Value Date/Time   COLORURINE YELLOW 12/08/2020 1158   APPEARANCEUR CLEAR 12/08/2020 1158   LABSPEC 1.010 12/08/2020 1158   PHURINE 6.0 12/08/2020 1158   GLUCOSEU NEGATIVE 12/08/2020 1158   HGBUR NEGATIVE 12/08/2020 [place] 12/08/2020 1158   BILIRUBINUR neg. 05/21/2013 1623   KETONESUR NEGATIVE 12/08/2020 1158   PROTEINUR neg. 05/21/2013 1623   UROBILINOGEN 1.0 12/08/2020 1158   NITRITE NEGATIVE 12/08/2020 1158   LEUKOCYTESUR TRACE (A) 12/08/2020 1158     STUDIES:  IR Paracentesis  Result Date: 03/08/2021 INDICATION: History of NASH cirrhosis, recurrent ascites. Request to IR for therapeutic paracentesis EXAM: ULTRASOUND GUIDED THERAPEUTIC PARACENTESIS MEDICATIONS: 10 mL 1% lidocaine COMPLICATIONS: None immediate. PROCEDURE: Informed written consent was obtained from the patient after a discussion of the risks, benefits and alternatives to treatment. A timeout was performed prior to the initiation of the procedure. Initial ultrasound scanning demonstrates a large amount of ascites within the left lower abdominal quadrant. The left lower abdomen was prepped and draped in the usual sterile fashion. 1% lidocaine was used for local anesthesia. Following this, a 19 gauge, 7-cm, Yueh catheter was introduced. An ultrasound image was saved for documentation purposes. The paracentesis was performed. The catheter was removed and a dressing was applied. The patient tolerated the procedure well without immediate post procedural complication. Patient received post-procedure intravenous albumin; see nursing notes for details. FINDINGS: A total of approximately 8.0 L of hazy yellow fluid was removed. IMPRESSION: Successful ultrasound-guided paracentesis yielding 8.0 liters of peritoneal fluid. Read by Candiss Norse, PA-C Electronically Signed   By: Ruthann Cancer M.D.   On: 03/08/2021 10:38   IR Paracentesis  Result Date: 03/01/2021 INDICATION: Paracentesis EXAM: ULTRASOUND GUIDED THERAPEUTIC PARACENTESIS MEDICATIONS: Albumin COMPLICATIONS: None immediate. PROCEDURE: Informed written consent was obtained from the patient after a discussion of the risks, benefits and alternatives to treatment. A timeout was performed prior to the initiation of the procedure. Initial ultrasound scanning demonstrates a large amount of ascites within the right lower abdominal quadrant. The right lower abdomen was prepped and draped in the usual sterile fashion. 1% lidocaine was used  for  local anesthesia. Following this, a 19 gauge, 7-cm, Yueh catheter was introduced. An ultrasound image was saved for documentation purposes. The paracentesis was performed. The catheter was removed and a dressing was applied. The patient tolerated the procedure well without immediate post procedural complication. Patient received post-procedure intravenous albumin; see nursing notes for details. FINDINGS: A total of approximately 8L of yellow fluid was removed. IMPRESSION: Successful ultrasound-guided paracentesis yielding 8L liters of peritoneal fluid. Electronically Signed   By: Sandi Mariscal M.D.   On: 03/01/2021 10:50   IR Paracentesis  Result Date: 02/22/2021 INDICATION: Patient with history of NASH cirrhosis with recurrent ascites. Request is for therapeutic paracentesis. EXAM: ULTRASOUND GUIDED THERAPEUTIC PARACENTESIS MEDICATIONS: Lidocaine 1% 10 mL COMPLICATIONS: None immediate. PROCEDURE: Informed written consent was obtained from the patient after a discussion of the risks, benefits and alternatives to treatment. A timeout was performed prior to the initiation of the procedure. Initial ultrasound scanning demonstrates a large amount of ascites within the right lower abdominal quadrant. The right lower abdomen was prepped and draped in the usual sterile fashion. 1% lidocaine was used for local anesthesia. Following this, a 19 gauge, 7-cm, Yueh catheter was introduced. An ultrasound image was saved for documentation purposes. The paracentesis was performed. The catheter was removed and a dressing was applied. The patient tolerated the procedure well without immediate post procedural complication. Patient received post-procedure intravenous albumin; see nursing notes for details. FINDINGS: A total of approximately 6.7 L of cloudy yellow fluid was removed. IMPRESSION: Successful ultrasound-guided therapeutic paracentesis yielding 6.7 liters of peritoneal fluid. Read by: Rushie Nyhan, NP Electronically  Signed   By: Ruthann Cancer M.D.   On: 02/22/2021 11:51      ELIGIBLE FOR AVAILABLE RESEARCH PROTOCOL: no   ASSESSMENT: 70 y.o. Selena Johnson, [place] woman s/p Left breast upper inner quadrant biopsy 06/20/2018 for a clinical T1b No, stage IA invasive ducatal carcinoma, grade 1, estrogen and progesterone receptor positive, HER-2 not amplified, with an Mib-1 of 5%  (1) status post left lumpectomy and sentinel lymph node sampling 07/12/2018 for a pT1b pN0, stage IA invasive ductal carcinoma, grade 1, with close but negative margins.  (a) a total of 2 sentinel lymph nodes were removed  (2) adjuvant radiation03/02/2019 - 09/07/2018  Site/dose:   The patient initially received a dose of 42.56 Gy in 16 fractions to the left breast using whole-breast tangent fields. This was delivered using a 3-D conformal technique. The patient then received a boost to the seroma. This delivered an additional 8 Gy in 4 fractions using a 3 field photon technique due to the depth of the seroma. The total dose was 50.56 Gy.   (3) anastrozole started 10/19/2018  (a) [facility] density scan at [facility] 07/30/2015 shows a T score of -0.3  (b) bone density 03/11/2020 shows a T score of -0.1.  (4) cirrhosis secondary to NASH  (A) liver MRI 01/08/2021 does not find evidence of hepatoma.  There is cirrhosis, splenomegaly, and extensive paraesophageal venous collateralization as well as large volume ascites.  (B) on 02/16/2021 PT/INR was 1.5, total bilirubin 4.5, albumin 3.2  PLAN: Selena Johnson is now 2-1/2 years out from definitive surgery for her breast cancer with no evidence of disease recurrence.  This is very favorable.  She is tolerating anastrozole well and the plan will be to continue that for total of 5 years.  She has had normal bone densities in the past and repeat can be considered October 2023.  More importantly she has developed cirrhosis.  This is being managed by GI.  At this point she is very burdened by the multiple  symptoms associated with that but is mentally very clear.  I suggested she start planning whether she can stay where she is living currently, or whether she should seek an independent living situation within a retirement or senior community where she can move on to assisted living if and when that becomes necessary.  I encouraged her to avoid sodium as she has been advised by nutrition.  I signed a handicap sticker for her.  She will return to see [place] in a year for routine breast cancer follow-up.  She knows to call for any other issues that may develop before then.  Total encounter time 25 minutes.*   Canyon Willow, Virgie Dad, MD  03/17/21 3:46 PM Medical Oncology and Hematology [facility] [place], [place] [address] Tel. [phone]    Fax. [phone]    I, Wilburn Mylar, am acting as scribe for Dr. Virgie Dad. Garlene Apperson.  I, Lurline Del MD, have reviewed the above documentation for accuracy and completeness, and I agree with the above.   *Total Encounter Time as defined by the Centers for Medicare and Medicaid Services includes, in addition to the face-to-face time of a patient visit (documented in the note above) non-face-to-face time: obtaining and reviewing outside history, ordering and reviewing medications, tests or procedures, care coordination (communications with other health care professionals or caregivers) and documentation in the medical record.

## 2021-03-19 ENCOUNTER — Ambulatory Visit (INDEPENDENT_AMBULATORY_CARE_PROVIDER_SITE_OTHER): Payer: Medicare Other | Admitting: Internal Medicine

## 2021-03-19 ENCOUNTER — Encounter: Payer: Self-pay | Admitting: Internal Medicine

## 2021-03-19 VITALS — BP 126/66 | HR 68 | Ht 65.0 in | Wt 181.1 lb

## 2021-03-19 DIAGNOSIS — K7460 Unspecified cirrhosis of liver: Secondary | ICD-10-CM | POA: Diagnosis not present

## 2021-03-19 DIAGNOSIS — K7290 Hepatic failure, unspecified without coma: Secondary | ICD-10-CM

## 2021-03-19 DIAGNOSIS — R188 Other ascites: Secondary | ICD-10-CM | POA: Diagnosis not present

## 2021-03-19 DIAGNOSIS — R601 Generalized edema: Secondary | ICD-10-CM | POA: Diagnosis not present

## 2021-03-19 MED ORDER — FUROSEMIDE 20 MG PO TABS: 40.0000 mg | ORAL_TABLET | Freq: Every day | ORAL | 1 refills | Status: DC

## 2021-03-19 MED ORDER — FUROSEMIDE 20 MG PO TABS: 40.0000 mg | ORAL_TABLET | Freq: Every day | ORAL | 1 refills | Status: AC

## 2021-03-19 MED ORDER — ONDANSETRON HCL 4 MG PO TABS: 4.0000 mg | ORAL_TABLET | Freq: Three times a day (TID) | ORAL | 1 refills | Status: AC | PRN

## 2021-03-19 MED ORDER — SPIRONOLACTONE 50 MG PO TABS: 100.0000 mg | ORAL_TABLET | Freq: Every day | ORAL | 1 refills | Status: AC

## 2021-03-19 NOTE — Patient Instructions (Addendum)
Your provider has requested that you go to the basement level for lab work next Tuesday October 18th. Press "B" on the elevator. The lab is located at the first door on the left as you exit the elevator.  No appointment is needed. They are open 8-5pm.  Weight daily.  We have sent the following medications to your pharmacy for you to pick up at your convenience: Zofran  Continue frequent small high protein meals.  We have placed a referral for TIPS evaluation.  Follow up with [place] 05/18/21 at 9:50am   I appreciate the opportunity to care for you. Silvano Rusk, MD, [facility]

## 2021-03-19 NOTE — Progress Notes (Signed)
Selena Johnson 70 y.o. [birth year] [id number]  Assessment & Plan:   Encounter Diagnoses  Name Primary?   Decompensated hepatic cirrhosis (HCC) Yes   Other ascites    Anasarca    Cirrhosis of liver with ascites, unspecified hepatic cirrhosis type ([place])     This is a difficult situation, she has persistent problems with ascites and anasarca.  Her social situation is a problem and that she is basically alone and still dealing with her husband's estate.  I want her to go back to weighing herself daily and following her weights and letting me know if there is a progressive significant increase. And additional challenges that her primary care provider is temporarily in a different location and that is making it essentially impossible for her to get there she says.  Palliative care consult was discussed and we will make a referral I think that that would be appropriate in her.  I.e. to really set goals of care.  She is a marginal candidate for TIPS it seems but I think we need to consider it so she will be referred to interventional radiology.  I have had some preliminary communication regarding this with IR physician. I have encouraged her to take frequent small high-protein meals.  The ascites compresses the stomach and makes it difficult for people in this situation to eat well enough.  Keep in mind that she is likely still grieving from her husband's death from cirrhosis.  Continue current diuretic therapy at this point with lab follow-up as below next week. Meds ordered this encounter  Medications   ondansetron (ZOFRAN) 4 MG tablet    Sig: Take 1 tablet (4 mg total) by mouth every 8 (eight) hours as needed for nausea or vomiting.    Dispense:  30 tablet    Refill:  1   DISCONTD: furosemide (LASIX) 20 MG tablet    Sig: Take 2 tablets (40 mg total) by mouth daily.    Dispense:  90 tablet    Refill:  1   furosemide (LASIX) 20 MG tablet    Sig: Take 2 tablets (40 mg total) by mouth  daily.    Dispense:  90 tablet    Refill:  1   spironolactone (ALDACTONE) 50 MG tablet    Sig: Take 2 tablets (100 mg total) by mouth daily.    Dispense:  90 tablet    Refill:  1   Orders Placed This Encounter  Procedures   CBC with Differential/Platelet   Comprehensive metabolic panel   Protime-INR   Magnesium   Ambulatory referral to Interventional Radiology    Subjective:   Chief Complaint: Follow-up of ascites and cirrhosis  HPI Selena Johnson presents complaining of edema with some open sores on her legs and persistent ascites.  She has had multiple paracenteses.  It has been challenging to titrate diuretics due to electrolyte disturbances.  Though she has persistent ascites and has some difficulty eating with that she had some pain issues after her last paracentesis and is not ready to pursue another 1.  She tells me that it is going to be difficult if not impossible for her to make it to Dr. Glori Johnson since Dr. Glori Johnson is temporarily a grand over.  She was weighing herself but stopped doing so as I did not make it clear for her to continue doing that.  She was seen in oncology 2 days ago with routine labs and follow-up.  She is still busy trying to settle her husband's estate.  Recent Labs  Lab 03/17/21 1344  NA 129*  K 5.4*  CL 95*  CO2 26  GLUCOSE 105*  BUN 44*  CREATININE 1.12*  CALCIUM 9.2     Wt Readings from Last 3 Encounters:  03/19/21 181 lb 2 oz (82.2 kg)  03/17/21 180 lb 1.6 oz (81.7 kg)  02/16/21 207 lb (93.9 kg)     Allergies  Allergen Reactions   Codeine     REACTION: Throat swelling   Ivp Dye [Iodinated Diagnostic Agents] Swelling   Current Meds  Medication Sig   anastrozole (ARIMIDEX) 1 MG tablet Take 1 tablet by mouth daily   Biotin 2500 MCG CAPS Take 5,000 mcg by mouth daily.   Cholecalciferol (VITAMIN D3) 2000 units TABS Take 2,000 Units by mouth daily.   levothyroxine (SYNTHROID) 200 MCG tablet Take 1 tablet (200 mcg total) by mouth daily before  breakfast.   loratadine (CLARITIN) 10 MG tablet Take 10 mg by mouth daily.   metoprolol succinate (TOPROL-XL) 100 MG 24 hr tablet Take 1 by mouth daily with or after a meal   ondansetron (ZOFRAN) 4 MG tablet Take 1 tablet (4 mg total) by mouth every 8 (eight) hours as needed for nausea or vomiting.   Polyvinyl Alcohol-Povidone PF (REFRESH) 1.4-0.6 % SOLN Place 1 drop into both eyes daily as needed (dry eyes).   potassium chloride SA (KLOR-CON) 20 MEQ tablet Take 1 tablet by mouth every day   [DISCONTINUED] furosemide (LASIX) 20 MG tablet Take 2 tablets (40 mg total) by mouth daily.   [DISCONTINUED] spironolactone (ALDACTONE) 50 MG tablet Take 2 tablets (100 mg total) by mouth daily.   Current Facility-Administered Medications for the 03/19/21 encounter (Office Visit) with Selena Mayer, MD  Medication   albumin human 25 % solution 50 g   albumin human 25 % solution 50 g   Past Medical History:  Diagnosis Date   Allergy    Cancer ([place])    basal cell skin CA   Chronic kidney disease    hx of kidney stones   Conjunctivitis    chronic   Fatty liver 2015   GERD (gastroesophageal reflux disease)    Glaucoma    Hearing loss in left ear    History of kidney stones    Hyperlipidemia    no per pt   Hypertension    Menopausal syndrome    NASH (nonalcoholic steatohepatitis)    with cirrhosis and mild esoph varicies   Primary hypothyroidism    Past Surgical History:  Procedure Laterality Date   ABDOMINAL HYSTERECTOMY     partial ? prolapse   BREAST LUMPECTOMY WITH RADIOACTIVE SEED AND SENTINEL LYMPH NODE BIOPSY Left 07/12/2018   Procedure: LEFT BREAST LUMPECTOMY WITH RADIOACTIVE SEED AND LEFT AXILLARY SENTINEL LYMPH NODE BIOPSY;  Surgeon: Rolm Bookbinder, MD;  Location: [facility];  Service: General;  Laterality: Left;   CHOLECYSTECTOMY     COLONOSCOPY     ESOPHAGOGASTRODUODENOSCOPY     IR PARACENTESIS  12/09/2020   IR PARACENTESIS  02/22/2021   IR PARACENTESIS  03/01/2021   IR  PARACENTESIS  03/08/2021   TONSILLECTOMY     UPPER GASTROINTESTINAL ENDOSCOPY     Social History   Social History Narrative   Patient is widowed in 2022 she lives alone   family history includes Cancer in her father; Diabetes in her father; Glaucoma in her mother; Heart disease in her father and mother; Hypertension in her father and mother.   Review of Systems As above  Objective:   Physical Exam BP 126/66   Pulse 68   Ht 5' 5"  (1.651 m)   Wt 181 lb 2 oz (82.2 kg)   SpO2 100%   BMI 30.14 kg/m  Chronically ill, frail needs assist to table hip flexors weak Icteric Lungs cta Cor NL Abd ascites Ext edema w/ small ulcers A and o x 3 w/ o asterixis

## 2021-03-20 DIAGNOSIS — Z23 Encounter for immunization: Secondary | ICD-10-CM | POA: Diagnosis not present

## 2021-03-22 ENCOUNTER — Encounter: Payer: Self-pay | Admitting: Internal Medicine

## 2021-03-23 ENCOUNTER — Other Ambulatory Visit (INDEPENDENT_AMBULATORY_CARE_PROVIDER_SITE_OTHER): Payer: Medicare Other

## 2021-03-23 DIAGNOSIS — R188 Other ascites: Secondary | ICD-10-CM

## 2021-03-23 DIAGNOSIS — K7460 Unspecified cirrhosis of liver: Secondary | ICD-10-CM

## 2021-03-23 DIAGNOSIS — R601 Generalized edema: Secondary | ICD-10-CM

## 2021-03-23 DIAGNOSIS — K7290 Hepatic failure, unspecified without coma: Secondary | ICD-10-CM | POA: Diagnosis not present

## 2021-03-23 LAB — CBC WITH DIFFERENTIAL/PLATELET
Basophils Absolute: 0 10*3/uL (ref 0.0–0.1)
Basophils Relative: 0.2 % (ref 0.0–3.0)
Eosinophils Absolute: 0.1 10*3/uL (ref 0.0–0.7)
Eosinophils Relative: 0.8 % (ref 0.0–5.0)
HCT: 33.6 % — ABNORMAL LOW (ref 36.0–46.0)
Hemoglobin: 11.7 g/dL — ABNORMAL LOW (ref 12.0–15.0)
Lymphocytes Relative: 4.5 % — ABNORMAL LOW (ref 12.0–46.0)
Lymphs Abs: 0.5 10*3/uL — ABNORMAL LOW (ref 0.7–4.0)
MCHC: 34.7 g/dL (ref 30.0–36.0)
MCV: 107 fl — ABNORMAL HIGH (ref 78.0–100.0)
Monocytes Absolute: 0.9 10*3/uL (ref 0.1–1.0)
Monocytes Relative: 8.9 % (ref 3.0–12.0)
Neutro Abs: 9.1 10*3/uL — ABNORMAL HIGH (ref 1.4–7.7)
Neutrophils Relative %: 85.6 % — ABNORMAL HIGH (ref 43.0–77.0)
Platelets: 152 10*3/uL (ref 150.0–400.0)
RBC: 3.14 Mil/uL — ABNORMAL LOW (ref 3.87–5.11)
RDW: 16.1 % — ABNORMAL HIGH (ref 11.5–15.5)
WBC: 10.6 10*3/uL — ABNORMAL HIGH (ref 4.0–10.5)

## 2021-03-23 LAB — COMPREHENSIVE METABOLIC PANEL
ALT: 27 U/L (ref 0–35)
AST: 45 U/L — ABNORMAL HIGH (ref 0–37)
Albumin: 2.9 g/dL — ABNORMAL LOW (ref 3.5–5.2)
Alkaline Phosphatase: 188 U/L — ABNORMAL HIGH (ref 39–117)
BUN: 51 mg/dL — ABNORMAL HIGH (ref 6–23)
CO2: 24 mEq/L (ref 19–32)
Calcium: 9.2 mg/dL (ref 8.4–10.5)
Chloride: 91 mEq/L — ABNORMAL LOW (ref 96–112)
Creatinine, Ser: 1.54 mg/dL — ABNORMAL HIGH (ref 0.40–1.20)
GFR: 33.53 mL/min — ABNORMAL LOW (ref >60.00)
Glucose, Bld: 106 mg/dL — ABNORMAL HIGH (ref 70–99)
Potassium: 5.8 mEq/L — ABNORMAL HIGH (ref 3.5–5.1)
Sodium: 122 mEq/L — ABNORMAL LOW (ref 135–145)
Total Bilirubin: 6.7 mg/dL — ABNORMAL HIGH (ref 0.2–1.2)
Total Protein: 6.3 g/dL (ref 6.0–8.3)

## 2021-03-23 LAB — PROTIME-INR
INR: 1.6 ratio — ABNORMAL HIGH (ref 0.8–1.0)
Prothrombin Time: 17.6 s — ABNORMAL HIGH (ref 9.6–13.1)

## 2021-03-23 LAB — MAGNESIUM: Magnesium: 2.1 mg/dL (ref 1.5–2.5)

## 2021-03-24 ENCOUNTER — Other Ambulatory Visit: Payer: Self-pay

## 2021-03-24 DIAGNOSIS — E871 Hypo-osmolality and hyponatremia: Secondary | ICD-10-CM

## 2021-03-25 ENCOUNTER — Telehealth: Payer: Self-pay | Admitting: Nurse Practitioner

## 2021-03-25 ENCOUNTER — Other Ambulatory Visit (INDEPENDENT_AMBULATORY_CARE_PROVIDER_SITE_OTHER): Payer: Medicare Other

## 2021-03-25 ENCOUNTER — Other Ambulatory Visit: Payer: Self-pay | Admitting: Internal Medicine

## 2021-03-25 ENCOUNTER — Telehealth: Payer: Self-pay | Admitting: Family Medicine

## 2021-03-25 DIAGNOSIS — E871 Hypo-osmolality and hyponatremia: Secondary | ICD-10-CM

## 2021-03-25 DIAGNOSIS — K7460 Unspecified cirrhosis of liver: Secondary | ICD-10-CM

## 2021-03-25 DIAGNOSIS — K7581 Nonalcoholic steatohepatitis (NASH): Secondary | ICD-10-CM

## 2021-03-25 LAB — BASIC METABOLIC PANEL
BUN: 46 mg/dL — ABNORMAL HIGH (ref 6–23)
CO2: 26 mEq/L (ref 19–32)
Calcium: 9.1 mg/dL (ref 8.4–10.5)
Chloride: 93 mEq/L — ABNORMAL LOW (ref 96–112)
Creatinine, Ser: 1.47 mg/dL — ABNORMAL HIGH (ref 0.40–1.20)
GFR: 35.46 mL/min — ABNORMAL LOW (ref >60.00)
Glucose, Bld: 110 mg/dL — ABNORMAL HIGH (ref 70–99)
Potassium: 5.5 mEq/L — ABNORMAL HIGH (ref 3.5–5.1)
Sodium: 125 mEq/L — ABNORMAL LOW (ref 135–145)

## 2021-03-25 NOTE — Telephone Encounter (Signed)
Referral for [facility] care coordination  Pt has end stage liver dz with anasarca and ascites   Any help is appreciated   Please let her know I put this through and she will get a call  Cc to Dr Carlean Purl

## 2021-03-25 NOTE — Telephone Encounter (Signed)
Attempted to contact patient to offer to schedule a Palliative Consult, no answer - left message with reason for call along with my name and call back number requesting a return call to schedule visit and also to answer any questions regarding Palliative services.

## 2021-03-26 ENCOUNTER — Other Ambulatory Visit: Payer: Self-pay

## 2021-03-26 ENCOUNTER — Telehealth: Payer: Self-pay | Admitting: *Deleted

## 2021-03-26 DIAGNOSIS — N289 Disorder of kidney and ureter, unspecified: Secondary | ICD-10-CM

## 2021-03-26 NOTE — Telephone Encounter (Signed)
Ret'd call to call and discussed the Palliative referral/services, patient wanted to know what we did and how much this was going to cost her.  I had a very lengthy discussion with patient trying to get her to understand how Medicare pays for Palliative services.  Patient was afraid that she was going to have a big expense to pay for copays etc., which I explained to her again that if she had difficulty paying for the copays that we would be willing to work with her.  She did not schedule a visit with [place] yet but she did agree for my supervisor to call her to discuss this further.  Patient requested a call from the Palliative Supervisor latter part of next week.  Notified Palliative Supervisor of above and she will f/u with patient at that time.

## 2021-03-26 NOTE — Chronic Care Management (AMB) (Signed)
  Chronic Care Management   Note  03/26/2021 Name: CHRISTINEA BRIZUELA MRN: [id number] DOB: 08-31-1950  Terese Door is a 70 y.o. year old female who is a primary care patient of Tower, [name], MD. I reached out to Terese Door by phone today in response to a referral sent by Ms. Hanley Ben Mones's PCP.  Ms. Courtois was given information about Chronic Care Management services today including:  CCM service includes personalized support from designated clinical staff supervised by her physician, including individualized plan of care and coordination with other care providers 24/7 contact phone numbers for assistance for urgent and routine care needs. Service will only be billed when office clinical staff spend 20 minutes or more in a month to coordinate care. Only one practitioner may furnish and bill the service in a calendar month. The patient may stop CCM services at any time (effective at the end of the month) by phone call to the office staff. The patient is responsible for co-pay (up to 20% after annual deductible is met) if co-pay is required by the individual health plan.   Patient agreed to services and verbal consent obtained.   Follow up plan: Telephone appointment with care management team member scheduled for: 03/29/2021  Julian Hy, [place] Management  Direct Dial: [phone]

## 2021-03-29 ENCOUNTER — Telehealth: Payer: Self-pay

## 2021-03-29 ENCOUNTER — Telehealth: Payer: Medicare Other

## 2021-03-29 ENCOUNTER — Other Ambulatory Visit (INDEPENDENT_AMBULATORY_CARE_PROVIDER_SITE_OTHER): Payer: Medicare Other

## 2021-03-29 ENCOUNTER — Telehealth: Payer: Self-pay | Admitting: Family Medicine

## 2021-03-29 DIAGNOSIS — E871 Hypo-osmolality and hyponatremia: Secondary | ICD-10-CM

## 2021-03-29 DIAGNOSIS — Z23 Encounter for immunization: Secondary | ICD-10-CM | POA: Diagnosis not present

## 2021-03-29 LAB — BASIC METABOLIC PANEL
BUN: 43 mg/dL — ABNORMAL HIGH (ref 6–23)
CO2: 25 mEq/L (ref 19–32)
Calcium: 9.2 mg/dL (ref 8.4–10.5)
Chloride: 93 mEq/L — ABNORMAL LOW (ref 96–112)
Creatinine, Ser: 1.14 mg/dL (ref 0.40–1.20)
GFR: 48.1 mL/min — ABNORMAL LOW (ref >60.00)
Glucose, Bld: 97 mg/dL (ref 70–99)
Potassium: 5.6 mEq/L — ABNORMAL HIGH (ref 3.5–5.1)
Sodium: 125 mEq/L — ABNORMAL LOW (ref 135–145)

## 2021-03-29 NOTE — Telephone Encounter (Signed)
  Care Management   Follow Up Note   03/29/2021 Name: Selena Johnson MRN: [id number] DOB: 1951-04-16   Referred by: Tower, [name], MD Reason for referral : Chronic Care Management (Initial assessment)   An unsuccessful telephone outreach was attempted today. The patient was referred to the case management team for assistance with care management and care coordination.   Follow Up Plan: A HIPPA compliant phone message was left for the patient providing contact information and requesting a return call.   Quinn Plowman RN,BSN,CCM RN Case Manager [place]  [phone]

## 2021-03-29 NOTE — Telephone Encounter (Signed)
  Encourage patient to contact the pharmacy for refills or they can request refills through [place]:  Please schedule appointment if longer than 1 year  NEXT APPOINTMENT DATE:  MEDICATION:levothyroxine (SYNTHROID) 200 MCG tablet  Is the patient out of medication? NO  PHARMACY:[id number] [place], [place] - [address] Let patient know to contact pharmacy at the end of the day to make sure medication is ready.  Please notify patient to allow 48-72 hours to process  CLINICAL FILLS OUT ALL BELOW:   LAST REFILL:  QTY:  REFILL DATE:    OTHER COMMENTS:    Okay for refill?  Please advise

## 2021-03-30 MED ORDER — LEVOTHYROXINE SODIUM 200 MCG PO TABS: 200.0000 ug | ORAL_TABLET | Freq: Every day | ORAL | 1 refills | Status: AC

## 2021-03-30 NOTE — Addendum Note (Signed)
Addended by: Tammi Sou on: 03/30/2021 03:32 PM   Modules accepted: Orders

## 2021-03-30 NOTE — Telephone Encounter (Signed)
Pt had TSH lab in May, will refill

## 2021-03-30 NOTE — Telephone Encounter (Signed)
Called pt. Pt didn't want to make an appt. Pt states she should be able to get it refilled.

## 2021-04-01 ENCOUNTER — Telehealth: Payer: Self-pay

## 2021-04-01 NOTE — Telephone Encounter (Signed)
RN call to schedule initial palliative care consult.  Scheduled for 11/29 at 9 am with patient

## 2021-04-03 ENCOUNTER — Inpatient Hospital Stay (HOSPITAL_COMMUNITY)
Admission: EM | Admit: 2021-04-03 | Discharge: 2021-05-06 | DRG: 871 | Disposition: E | Payer: Medicare Other | Attending: Internal Medicine | Admitting: Internal Medicine

## 2021-04-03 ENCOUNTER — Emergency Department (HOSPITAL_COMMUNITY): Payer: Medicare Other

## 2021-04-03 ENCOUNTER — Other Ambulatory Visit: Payer: Self-pay

## 2021-04-03 ENCOUNTER — Encounter (HOSPITAL_COMMUNITY): Payer: Self-pay | Admitting: Emergency Medicine

## 2021-04-03 DIAGNOSIS — Z79899 Other long term (current) drug therapy: Secondary | ICD-10-CM

## 2021-04-03 DIAGNOSIS — Z885 Allergy status to narcotic agent status: Secondary | ICD-10-CM

## 2021-04-03 DIAGNOSIS — R188 Other ascites: Secondary | ICD-10-CM | POA: Diagnosis present

## 2021-04-03 DIAGNOSIS — E872 Acidosis, unspecified: Secondary | ICD-10-CM | POA: Diagnosis present

## 2021-04-03 DIAGNOSIS — Z515 Encounter for palliative care: Secondary | ICD-10-CM | POA: Diagnosis not present

## 2021-04-03 DIAGNOSIS — K7200 Acute and subacute hepatic failure without coma: Secondary | ICD-10-CM | POA: Diagnosis present

## 2021-04-03 DIAGNOSIS — K652 Spontaneous bacterial peritonitis: Secondary | ICD-10-CM | POA: Diagnosis present

## 2021-04-03 DIAGNOSIS — G9341 Metabolic encephalopathy: Secondary | ICD-10-CM | POA: Diagnosis present

## 2021-04-03 DIAGNOSIS — Z8249 Family history of ischemic heart disease and other diseases of the circulatory system: Secondary | ICD-10-CM

## 2021-04-03 DIAGNOSIS — I10 Essential (primary) hypertension: Secondary | ICD-10-CM | POA: Diagnosis present

## 2021-04-03 DIAGNOSIS — E039 Hypothyroidism, unspecified: Secondary | ICD-10-CM | POA: Diagnosis present

## 2021-04-03 DIAGNOSIS — R571 Hypovolemic shock: Secondary | ICD-10-CM | POA: Diagnosis present

## 2021-04-03 DIAGNOSIS — R6521 Severe sepsis with septic shock: Secondary | ICD-10-CM | POA: Diagnosis present

## 2021-04-03 DIAGNOSIS — K7201 Acute and subacute hepatic failure with coma: Secondary | ICD-10-CM

## 2021-04-03 DIAGNOSIS — K7682 Hepatic encephalopathy: Secondary | ICD-10-CM | POA: Diagnosis present

## 2021-04-03 DIAGNOSIS — E871 Hypo-osmolality and hyponatremia: Secondary | ICD-10-CM | POA: Diagnosis present

## 2021-04-03 DIAGNOSIS — Z7189 Other specified counseling: Secondary | ICD-10-CM

## 2021-04-03 DIAGNOSIS — E875 Hyperkalemia: Secondary | ICD-10-CM | POA: Diagnosis present

## 2021-04-03 DIAGNOSIS — Z20822 Contact with and (suspected) exposure to covid-19: Secondary | ICD-10-CM | POA: Diagnosis present

## 2021-04-03 DIAGNOSIS — Z91041 Radiographic dye allergy status: Secondary | ICD-10-CM

## 2021-04-03 DIAGNOSIS — K7460 Unspecified cirrhosis of liver: Secondary | ICD-10-CM | POA: Diagnosis present

## 2021-04-03 DIAGNOSIS — E43 Unspecified severe protein-calorie malnutrition: Secondary | ICD-10-CM | POA: Diagnosis present

## 2021-04-03 DIAGNOSIS — R7881 Bacteremia: Secondary | ICD-10-CM | POA: Diagnosis not present

## 2021-04-03 DIAGNOSIS — R627 Adult failure to thrive: Secondary | ICD-10-CM | POA: Diagnosis present

## 2021-04-03 DIAGNOSIS — N170 Acute kidney failure with tubular necrosis: Secondary | ICD-10-CM | POA: Diagnosis present

## 2021-04-03 DIAGNOSIS — H9192 Unspecified hearing loss, left ear: Secondary | ICD-10-CM | POA: Diagnosis present

## 2021-04-03 DIAGNOSIS — E785 Hyperlipidemia, unspecified: Secondary | ICD-10-CM | POA: Diagnosis present

## 2021-04-03 DIAGNOSIS — K7581 Nonalcoholic steatohepatitis (NASH): Secondary | ICD-10-CM | POA: Diagnosis present

## 2021-04-03 DIAGNOSIS — H409 Unspecified glaucoma: Secondary | ICD-10-CM | POA: Diagnosis present

## 2021-04-03 DIAGNOSIS — Z789 Other specified health status: Secondary | ICD-10-CM | POA: Diagnosis not present

## 2021-04-03 DIAGNOSIS — Z79890 Hormone replacement therapy (postmenopausal): Secondary | ICD-10-CM

## 2021-04-03 DIAGNOSIS — B9689 Other specified bacterial agents as the cause of diseases classified elsewhere: Secondary | ICD-10-CM | POA: Diagnosis present

## 2021-04-03 DIAGNOSIS — Z66 Do not resuscitate: Secondary | ICD-10-CM | POA: Diagnosis not present

## 2021-04-03 DIAGNOSIS — K7290 Hepatic failure, unspecified without coma: Secondary | ICD-10-CM | POA: Diagnosis not present

## 2021-04-03 DIAGNOSIS — Z853 Personal history of malignant neoplasm of breast: Secondary | ICD-10-CM | POA: Diagnosis not present

## 2021-04-03 DIAGNOSIS — D6489 Other specified anemias: Secondary | ICD-10-CM | POA: Diagnosis present

## 2021-04-03 DIAGNOSIS — K219 Gastro-esophageal reflux disease without esophagitis: Secondary | ICD-10-CM | POA: Diagnosis present

## 2021-04-03 DIAGNOSIS — Z85828 Personal history of other malignant neoplasm of skin: Secondary | ICD-10-CM

## 2021-04-03 DIAGNOSIS — Z6831 Body mass index (BMI) 31.0-31.9, adult: Secondary | ICD-10-CM

## 2021-04-03 DIAGNOSIS — Z87891 Personal history of nicotine dependence: Secondary | ICD-10-CM

## 2021-04-03 DIAGNOSIS — Z79811 Long term (current) use of aromatase inhibitors: Secondary | ICD-10-CM

## 2021-04-03 DIAGNOSIS — R0989 Other specified symptoms and signs involving the circulatory and respiratory systems: Secondary | ICD-10-CM | POA: Diagnosis not present

## 2021-04-03 DIAGNOSIS — A419 Sepsis, unspecified organism: Secondary | ICD-10-CM | POA: Diagnosis present

## 2021-04-03 DIAGNOSIS — D6959 Other secondary thrombocytopenia: Secondary | ICD-10-CM | POA: Diagnosis present

## 2021-04-03 DIAGNOSIS — R079 Chest pain, unspecified: Secondary | ICD-10-CM | POA: Diagnosis not present

## 2021-04-03 LAB — PROTIME-INR
INR: 1.9 — ABNORMAL HIGH (ref 0.8–1.2)
Prothrombin Time: 21.4 seconds — ABNORMAL HIGH (ref 11.4–15.2)

## 2021-04-03 LAB — COMPREHENSIVE METABOLIC PANEL
ALT: 43 U/L (ref 0–44)
AST: 66 U/L — ABNORMAL HIGH (ref 15–41)
Albumin: 2.2 g/dL — ABNORMAL LOW (ref 3.5–5.0)
Alkaline Phosphatase: 170 U/L — ABNORMAL HIGH (ref 38–126)
Anion gap: 11 (ref 5–15)
BUN: 52 mg/dL — ABNORMAL HIGH (ref 8–23)
CO2: 21 mmol/L — ABNORMAL LOW (ref 22–32)
Calcium: 9 mg/dL (ref 8.9–10.3)
Chloride: 93 mmol/L — ABNORMAL LOW (ref 98–111)
Creatinine, Ser: 1.54 mg/dL — ABNORMAL HIGH (ref 0.44–1.00)
GFR, Estimated: 36 mL/min — ABNORMAL LOW (ref >60)
Glucose, Bld: 83 mg/dL (ref 70–99)
Potassium: 5.7 mmol/L — ABNORMAL HIGH (ref 3.5–5.1)
Sodium: 125 mmol/L — ABNORMAL LOW (ref 135–145)
Total Bilirubin: 7.8 mg/dL — ABNORMAL HIGH (ref 0.3–1.2)
Total Protein: 6.2 g/dL — ABNORMAL LOW (ref 6.5–8.1)

## 2021-04-03 LAB — GLUCOSE, CAPILLARY
Glucose-Capillary: 61 mg/dL — ABNORMAL LOW (ref 70–99)
Glucose-Capillary: 100 mg/dL — ABNORMAL HIGH (ref 70–99)
Glucose-Capillary: 88 mg/dL (ref 70–99)

## 2021-04-03 LAB — MAGNESIUM: Magnesium: 2.3 mg/dL (ref 1.7–2.4)

## 2021-04-03 LAB — CBC WITH DIFFERENTIAL/PLATELET
Abs Immature Granulocytes: 0.08 10*3/uL — ABNORMAL HIGH (ref 0.00–0.07)
Basophils Absolute: 0 10*3/uL (ref 0.0–0.1)
Basophils Relative: 0 %
Eosinophils Absolute: 0 10*3/uL (ref 0.0–0.5)
Eosinophils Relative: 0 %
HCT: 34.1 % — ABNORMAL LOW (ref 36.0–46.0)
Hemoglobin: 12.4 g/dL (ref 12.0–15.0)
Immature Granulocytes: 1 %
Lymphocytes Relative: 3 %
Lymphs Abs: 0.5 10*3/uL — ABNORMAL LOW (ref 0.7–4.0)
MCH: 37.6 pg — ABNORMAL HIGH (ref 26.0–34.0)
MCHC: 36.4 g/dL — ABNORMAL HIGH (ref 30.0–36.0)
MCV: 103.3 fL — ABNORMAL HIGH (ref 80.0–100.0)
Monocytes Absolute: 0.5 10*3/uL (ref 0.1–1.0)
Monocytes Relative: 3 %
Neutro Abs: 14.3 10*3/uL — ABNORMAL HIGH (ref 1.7–7.7)
Neutrophils Relative %: 93 %
Platelets: DECREASED 10*3/uL (ref 150–400)
RBC: 3.3 MIL/uL — ABNORMAL LOW (ref 3.87–5.11)
RDW: 16.8 % — ABNORMAL HIGH (ref 11.5–15.5)
WBC: 15.4 10*3/uL — ABNORMAL HIGH (ref 4.0–10.5)
nRBC: 0 % (ref 0.0–0.2)

## 2021-04-03 LAB — LIPASE, BLOOD: Lipase: 48 U/L (ref 11–51)

## 2021-04-03 LAB — BODY FLUID CELL COUNT WITH DIFFERENTIAL
Eos, Fluid: 0 %
Lymphs, Fluid: 1 %
Monocyte-Macrophage-Serous Fluid: 11 % — ABNORMAL LOW (ref 50–90)
Neutrophil Count, Fluid: 88 % — ABNORMAL HIGH (ref 0–25)
Total Nucleated Cell Count, Fluid: 2323 cu mm — ABNORMAL HIGH (ref 0–1000)

## 2021-04-03 LAB — AMMONIA: Ammonia: 83 umol/L — ABNORMAL HIGH (ref 9–35)

## 2021-04-03 LAB — LACTIC ACID, PLASMA
Lactic Acid, Venous: 3.4 mmol/L (ref 0.5–1.9)
Lactic Acid, Venous: 3.3 mmol/L (ref 0.5–1.9)

## 2021-04-03 LAB — RESP PANEL BY RT-PCR (FLU A&B, COVID) ARPGX2
Influenza A by PCR: NEGATIVE
Influenza B by PCR: NEGATIVE
SARS Coronavirus 2 by RT PCR: NEGATIVE

## 2021-04-03 LAB — TSH: TSH: 0.293 u[IU]/mL — ABNORMAL LOW (ref 0.350–4.500)

## 2021-04-03 MED ORDER — FENTANYL CITRATE (PF) 100 MCG/2ML IJ SOLN
25.0000 ug | INTRAMUSCULAR | Status: DC | PRN
Start: 2021-04-03 — End: 2021-04-04
  Administered 2021-04-03 – 2021-04-04 (×6): 25 ug via INTRAVENOUS
  Filled 2021-04-03 (×6): qty 2

## 2021-04-03 MED ORDER — NOREPINEPHRINE 4 MG/250ML-% IV SOLN
2.0000 ug/min | INTRAVENOUS | Status: DC
Start: 2021-04-03 — End: 2021-04-04
  Administered 2021-04-03: 2 ug/min via INTRAVENOUS
  Administered 2021-04-04: 6 ug/min via INTRAVENOUS
  Filled 2021-04-03 (×2): qty 250

## 2021-04-03 MED ORDER — LACTULOSE 10 GM/15ML PO SOLN: 20.0000 g | Freq: Three times a day (TID) | ORAL | Status: DC

## 2021-04-03 MED ORDER — ALBUMIN HUMAN 25 % IV SOLN
100.0000 g | Freq: Once | INTRAVENOUS | Status: AC
  Administered 2021-04-03: 100 g via INTRAVENOUS
  Filled 2021-04-03 (×3): qty 400

## 2021-04-03 MED ORDER — SODIUM CHLORIDE 0.9 % IV SOLN
2.0000 g | INTRAVENOUS | Status: DC
  Administered 2021-04-03: 2 g via INTRAVENOUS
  Filled 2021-04-03: qty 20

## 2021-04-03 MED ORDER — DEXTROSE 50 % IV SOLN
12.5000 g | Freq: Once | INTRAVENOUS | Status: AC
  Administered 2021-04-03: 12.5 g via INTRAVENOUS

## 2021-04-03 MED ORDER — DEXTROSE 50 % IV SOLN
INTRAVENOUS | Status: AC
  Filled 2021-04-03: qty 50

## 2021-04-03 MED ORDER — SODIUM CHLORIDE 0.9 % IV SOLN
250.0000 mL | INTRAVENOUS | Status: DC
  Administered 2021-04-03: 250 mL via INTRAVENOUS

## 2021-04-03 MED ORDER — LIP MEDEX EX OINT
TOPICAL_OINTMENT | CUTANEOUS | Status: DC | PRN
  Administered 2021-04-05 – 2021-04-06 (×4): 75 via TOPICAL
  Filled 2021-04-03: qty 7

## 2021-04-03 MED ORDER — LACTATED RINGERS IV BOLUS
500.0000 mL | Freq: Once | INTRAVENOUS | Status: AC
  Administered 2021-04-03: 500 mL via INTRAVENOUS

## 2021-04-03 MED ORDER — SODIUM CHLORIDE 0.9 % IV BOLUS
1000.0000 mL | Freq: Once | INTRAVENOUS | Status: AC
  Administered 2021-04-03: 1000 mL via INTRAVENOUS

## 2021-04-03 MED ORDER — SODIUM CHLORIDE 0.9 % IV SOLN
1.0000 g | Freq: Once | INTRAVENOUS | Status: AC
  Administered 2021-04-03: 1 g via INTRAVENOUS
  Filled 2021-04-03: qty 10

## 2021-04-03 MED ORDER — POLYETHYLENE GLYCOL 3350 17 G PO PACK: 17.0000 g | PACK | Freq: Every day | ORAL | Status: DC | PRN

## 2021-04-03 MED ORDER — ORAL CARE MOUTH RINSE
15.0000 mL | Freq: Two times a day (BID) | OROMUCOSAL | Status: DC
  Administered 2021-04-03 – 2021-04-08 (×9): 15 mL via OROMUCOSAL

## 2021-04-03 MED ORDER — DOCUSATE SODIUM 100 MG PO CAPS: 100.0000 mg | ORAL_CAPSULE | Freq: Two times a day (BID) | ORAL | Status: DC | PRN

## 2021-04-03 MED ORDER — IPRATROPIUM-ALBUTEROL 0.5-2.5 (3) MG/3ML IN SOLN: 3.0000 mL | Freq: Once | RESPIRATORY_TRACT | Status: DC

## 2021-04-03 MED ORDER — SODIUM CHLORIDE 0.9 % IV SOLN
500.0000 mg | Freq: Once | INTRAVENOUS | Status: AC
  Administered 2021-04-03: 500 mg via INTRAVENOUS
  Filled 2021-04-03: qty 500

## 2021-04-03 MED ORDER — CHLORHEXIDINE GLUCONATE CLOTH 2 % EX PADS
6.0000 | MEDICATED_PAD | Freq: Every day | CUTANEOUS | Status: DC
  Administered 2021-04-03 – 2021-04-04 (×2): 6 via TOPICAL

## 2021-04-03 NOTE — Consult Note (Addendum)
Consultation Note Date: 04/05/2021   Patient Name: Selena Johnson  DOB: [birth year]  MRN: [id number]  Age / Sex: 70 y.o., female  PCP: Tower, [name], MD Referring Physician: Lanier Clam, MD  Reason for Consultation: Establishing goals of care "decompensated liver failure, end-stage"  HPI/Patient Profile: 70 y.o. female  with past medical history of decompensated hepatic cirrhosis secondary to NASH, ascites, breast cancer, basal cell carcinoma, HTN, and HLD who presented to the emergency department on 03/15/2021 with complaints of nausea, fatigue, and generalized weakness. On admission to the ED, patient was hypotensive and bradycardic. She was admitted to PCCM with septic shock due to presumed SBP.   Clinical Assessment and Goals of Care: I have reviewed medical records including EPIC notes, labs and imaging.  PCCM discussed [place] earlier this afternoon with patient's friend, Lowry Bowl, who is listed as next of kin in the chart. Decision made to pursue diagnostic paracentesis and peripheral pressors as needed, but no central line or other invasive procedures. Bedside paracentesis was then performed and yielded purulent appearing fluid.  I went to see the patient at bedside. She is currently on levophed at 5 mcg/minute. She is lethargic. She tells me that she is doing "terrible" and that she hurts "everywhere". Unfortunately, she is unable to participate in [place] discussion at this time.   I spoke with her friend Malachy Mood by phone to discuss diagnosis, prognosis, [place], EOL wishes, disposition, and options. I introduced Palliative Medicine as specialized medical care for people living with serious illness. It focuses on providing relief from the symptoms and stress of a serious illness. Malachy Mood tells me that she and [name] have been close friends for 20 years. She tells me she is [name]'s durable POA. Patient does  not have a documented HCPOA.  We discussed a brief life review of the patient. 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 They eventually sold it, purchased a motor home, and traveled all over the [place]. Malachy Mood states they lived a wonderful life together. Sadly, [name]'s husband passed away only a few months ago. They did not have children. Her only family member is a niece, who is not involved Malachy Mood does not even know her last name).   After her husband died, [name] has lived alone in their home in [place]. As far as functional status, she was ambulatory and independent. However, Malachy Mood reports that [name]'s quality of life had been impacted by "all that fluid". She reports Malachy Mood has had multiple paracenteses. I provided education on refractory ascites.   We discussed her current illness and what it means in the larger context of her ongoing co-morbidities.  Natural disease trajectory of end-stage liver disease was discussed, emphasizing that it is a non-curable illness. Discussed that [name] is at high risk of further decompensation and may not survive this hospitalization. [name] verbalizes understanding.   The difference between full scope medical intervention and comfort care was considered. Provided education that comfort care means de-escalating and stopping full  scope medical interventions, allowing a natural course to occur. Discussed that the goal is comfort and dignity rather than cure/prolonging life. [name] verbalizes understanding, and states "I don't want her to suffer".   At this time Malachy Mood would like to continue current supportive interventions with watchful waiting.  Brief discussion was had around goals if patient does not improve or continues to decline - Malachy Mood is open to transition to full comfort care at that time.   Primary decision maker: Lowry Bowl    SUMMARY OF RECOMMENDATIONS   Continue current supportive  care with watchful waiting If patient does not improve or continues to decline, friend/[name] is open to transition to comfort care PMT will continue to follow and support  Code Status/Advance Care Planning: DNR  Palliative Prophylaxis:  Oral Care and Turn Reposition  Additional Recommendations (Limitations, Scope, Preferences): No central line or invasive procedures  Prognosis:  poor  Discharge Planning: To Be Determined      Primary Diagnoses: Present on Admission:  Septic shock ([place])   I have reviewed the medical record, interviewed the patient and family, and examined the patient. The following aspects are pertinent.  Past Medical History:  Diagnosis Date   Allergy    Cancer ([place])    basal cell skin CA   Chronic kidney disease    hx of kidney stones   Conjunctivitis    chronic   Fatty liver 2015   GERD (gastroesophageal reflux disease)    Glaucoma    Hearing loss in left ear    History of kidney stones    Hyperlipidemia    no per pt   Hypertension    Menopausal syndrome    NASH (nonalcoholic steatohepatitis)    with cirrhosis and mild esoph varicies   Primary hypothyroidism      Family History  Problem Relation Age of Onset   Heart disease Mother        CAD   Hypertension Mother    Glaucoma Mother    Heart disease Father        CAD   Diabetes Father    Hypertension Father    Cancer Father        ? CA - squamous cell carcinoma   Colon cancer Neg Hx    Esophageal cancer Neg Hx    Rectal cancer Neg Hx    Stomach cancer Neg Hx    Scheduled Meds:  lactulose  20 g Oral TID   Continuous Infusions:  sodium chloride 250 mL (04/02/2021 1559)   cefTRIAXone (ROCEPHIN)  IV     lactated ringers     norepinephrine (LEVOPHED) Adult infusion 2 mcg/min (04/02/2021 1556)   PRN Meds:.docusate sodium, fentaNYL (SUBLIMAZE) injection, polyethylene glycol   Allergies  Allergen Reactions   Codeine Anaphylaxis, Swelling and Other (See Comments)    Throat  swelling   Ivp Dye [Iodinated Diagnostic Agents] Swelling   Review of Systems  Unable to perform ROS: Mental status change   Physical Exam Vitals reviewed.  Constitutional:      General: She is not in acute distress.    Appearance: She is ill-appearing.  Pulmonary:     Effort: Pulmonary effort is normal.  Abdominal:     General: There is distension.  Skin:    Coloration: Skin is jaundiced.  Neurological:     Mental Status: She is lethargic.     Motor: Weakness present.    Vital Signs: BP 91/60   Pulse (!) 58   Temp 97.6 F (36.4  C) (Oral)   Resp 16   SpO2 99%  Pain Scale: 0-10   Pain Score: 0-No pain   SpO2: SpO2: 99 % O2 Device:SpO2: 99 % O2 Flow Rate: .     Palliative Assessment/Data: PPS 10%     Time In: 1815 Time Out: 1925 Time Total: 70 minutes Greater than 50%  of this time was spent counseling and coordinating care related to the above assessment and plan.  Signed by: Lavena Bullion, NP   Please contact Palliative Medicine Team phone at [phone] for questions and concerns.  For individual provider: See Shea Evans

## 2021-04-03 NOTE — ED Provider Notes (Addendum)
[facility] EMERGENCY DEPARTMENT Provider Note   CSN: [id number] Arrival date & time: 03/25/2021  0430     History Chief Complaint  Patient presents with   Nausea ;Fatigue ;Headache    Selena Johnson is a 70 y.o. female.  Patient with history of breast cancer, [name] cirrhosis, anasarca and ascites --presents the emergency department for pain and weakness.  Much of the history was given from a family friend at bedside.  Over the past 48 hours patient has had multiple (3+) falls and increasing weakness.  She has been able to get up off the ground without assistance from others.  She was complaining of pain all over her abdomen and back this morning and she asked to be taken to the hospital.  Patient states that she just "feels terrible".  She has unable/unwilling to give me more details.  Family friend notes that she has been more confused today and that she is typically more directed descriptive.  He thinks that she just may be very "worn out".  With more directed questioning, she does state that she has had a headache, been very weak, had some diarrhea.  The onset of this condition was acute on chronic. The course is constant. Aggravating factors: none. Alleviating factors: none.        Past Medical History:  Diagnosis Date   Allergy    Cancer ([place])    basal cell skin CA   Chronic kidney disease    hx of kidney stones   Conjunctivitis    chronic   Fatty liver 2015   GERD (gastroesophageal reflux disease)    Glaucoma    Hearing loss in left ear    History of kidney stones    Hyperlipidemia    no per pt   Hypertension    Menopausal syndrome    NASH (nonalcoholic steatohepatitis)    with cirrhosis and mild esoph varicies   Primary hypothyroidism     Patient Active Problem List   Diagnosis Date Noted   Venous insufficiency 10/27/2020   Volume overload 10/21/2020   Pedal edema 10/21/2020   Caregiver stress 09/07/2020   Secondary esophageal varices without  bleeding ([place]) 09/07/2020   Malignant neoplasm of upper-inner quadrant of left breast in female, estrogen receptor positive ([place]) 06/27/2018   Left breast mass 06/17/2018   Elevated random blood glucose level 07/28/2017   Skin cancer screening 05/05/2017   Colon cancer screening 07/21/2015   Estrogen deficiency 07/21/2015   Calcification of right breast 05/06/2015   Encounter for Medicare annual wellness exam 07/20/2014   Unspecified constipation 10/25/2012   Liver cirrhosis secondary to NASH ([place]) 10/23/2012   Hypokalemia 07/13/2012   Thrombocytopenia ([place]) 11/10/2010   Routine general medical examination at a health care facility 11/04/2010   Hyperlipidemia 03/18/2008   Obesity 03/18/2008   TRANSAMINASES, SERUM, ELEVATED 03/18/2008   MENOPAUSE-RELATED VASOMOTOR SYMPTOMS, HOT FLASHES 10/23/2006   Hypothyroidism 10/11/2006   GLAUCOMA 10/11/2006   Essential hypertension 10/11/2006   GERD 10/11/2006   History of kidney stones 10/11/2006    Past Surgical History:  Procedure Laterality Date   ABDOMINAL HYSTERECTOMY     partial ? prolapse   BREAST LUMPECTOMY WITH RADIOACTIVE SEED AND SENTINEL LYMPH NODE BIOPSY Left 07/12/2018   Procedure: LEFT BREAST LUMPECTOMY WITH RADIOACTIVE SEED AND LEFT AXILLARY SENTINEL LYMPH NODE BIOPSY;  Surgeon: Rolm Bookbinder, MD;  Location: [facility] OR;  Service: General;  Laterality: Left;   CHOLECYSTECTOMY     COLONOSCOPY     ESOPHAGOGASTRODUODENOSCOPY  IR PARACENTESIS  12/09/2020   IR PARACENTESIS  02/22/2021   IR PARACENTESIS  03/01/2021   IR PARACENTESIS  03/08/2021   TONSILLECTOMY     UPPER GASTROINTESTINAL ENDOSCOPY       OB History   No obstetric history on file.     Family History  Problem Relation Age of Onset   Heart disease Mother        CAD   Hypertension Mother    Glaucoma Mother    Heart disease Father        CAD   Diabetes Father    Hypertension Father    Cancer Father        ? CA - squamous cell carcinoma   Colon cancer  Neg Hx    Esophageal cancer Neg Hx    Rectal cancer Neg Hx    Stomach cancer Neg Hx     Social History   Tobacco Use   Smoking status: Former    Types: Cigarettes    Quit date: 06/06/1982    Years since quitting: 38.8   Smokeless tobacco: Never  Vaping Use   Vaping Use: Never used  Substance Use Topics   Alcohol use: Yes    Alcohol/week: 0.0 standard drinks    Comment: wine- rare   Drug use: No    Home Medications Prior to Admission medications   Medication Sig Start Date End Date Taking? Authorizing Provider  anastrozole (ARIMIDEX) 1 MG tablet Take 1 tablet by mouth daily 09/08/20   Magrinat, Virgie Dad, MD  Biotin 2500 MCG CAPS Take 5,000 mcg by mouth daily.    [provider]  Cholecalciferol (VITAMIN D3) 2000 units TABS Take 2,000 Units by mouth daily.    [provider]  furosemide (LASIX) 20 MG tablet Take 2 tablets (40 mg total) by mouth daily. 03/19/21   Gatha Mayer, MD  levothyroxine (SYNTHROID) 200 MCG tablet Take 1 tablet (200 mcg total) by mouth daily before breakfast. 03/30/21   Tower, [name], MD  loratadine (CLARITIN) 10 MG tablet Take 10 mg by mouth daily.    [provider]  metoprolol succinate (TOPROL-XL) 100 MG 24 hr tablet Take 1 by mouth daily with or after a meal 09/07/20   Tower, [name], MD  ondansetron (ZOFRAN) 4 MG tablet Take 1 tablet (4 mg total) by mouth every 8 (eight) hours as needed for nausea or vomiting. 03/19/21   Gatha Mayer, MD  Polyvinyl Alcohol-Povidone PF (REFRESH) 1.4-0.6 % SOLN Place 1 drop into both eyes daily as needed (dry eyes).    [provider]  potassium chloride SA (KLOR-CON) 20 MEQ tablet Take 1 tablet by mouth every day 09/07/20   Tower, [name], MD  spironolactone (ALDACTONE) 50 MG tablet Take 2 tablets (100 mg total) by mouth daily. 03/19/21   Gatha Mayer, MD    Allergies    Codeine and Ivp dye [iodinated diagnostic agents]  Review of Systems   Review of Systems  Constitutional:   Positive for appetite change and fatigue. Negative for chills and fever.  HENT:  Negative for rhinorrhea and sore throat.   Eyes:  Negative for redness.  Respiratory:  Negative for cough.   Cardiovascular:  Positive for leg swelling. Negative for chest pain.  Gastrointestinal:  Positive for abdominal pain. Negative for diarrhea, nausea and vomiting.  Genitourinary:  Negative for dysuria, frequency, hematuria and urgency.  Musculoskeletal:  Positive for back pain. Negative for myalgias.  Skin:  Negative for rash.  Neurological:  Positive for weakness (generalized). Negative for headaches.   Physical Exam Updated Vital Signs BP (!) 118/38   Pulse 63   Temp 97.6 F (36.4 C) (Oral)   Resp 17   SpO2 100%   Physical Exam Vitals and nursing note reviewed.  Constitutional:      General: She is not in acute distress.    Appearance: She is well-developed.  HENT:     Head: Normocephalic and atraumatic.     Right Ear: External ear normal.     Left Ear: External ear normal.     Nose: Nose normal.  Eyes:     General: Scleral icterus (mild) present.  Cardiovascular:     Rate and Rhythm: Normal rate and regular rhythm.     Heart sounds: No murmur heard. Pulmonary:     Effort: No respiratory distress.     Breath sounds: No wheezing, rhonchi or rales.  Abdominal:     General: There is distension.     Palpations: Abdomen is soft.     Tenderness: There is no abdominal tenderness. There is no guarding or rebound.     Comments: Distention with fluid wave.  No tenderness to palpation.  Musculoskeletal:     Cervical back: Normal range of motion and neck supple.     Right lower leg: Edema present.     Left lower leg: Edema present.     Comments: Extensive lower extremity edema and anasarca with some bullae noted to the ankles.  No significant cellulitis.  Skin:    General: Skin is warm and dry.     Findings: No rash.  Neurological:     General: No focal deficit present.     Mental Status:  She is alert.     Comments: Patient is slow to answer questions, very sparse in details when answering questions.  Psychiatric:        Mood and Affect: Mood normal.    ED Results / Procedures / Treatments   Labs (all labs ordered are listed, but only abnormal results are displayed) Labs Reviewed  CBC WITH DIFFERENTIAL/PLATELET - Abnormal; Notable for the following components:      Result Value   WBC 15.4 (*)    RBC 3.30 (*)    HCT 34.1 (*)    MCV 103.3 (*)    MCH 37.6 (*)    MCHC 36.4 (*)    RDW 16.8 (*)    Neutro Abs 14.3 (*)    Lymphs Abs 0.5 (*)    Abs Immature Granulocytes 0.08 (*)    All other components within normal limits  COMPREHENSIVE METABOLIC PANEL - Abnormal; Notable for the following components:   Sodium 125 (*)    Potassium 5.7 (*)    Chloride 93 (*)    CO2 21 (*)    BUN 52 (*)    Creatinine, Ser 1.54 (*)    Total Protein 6.2 (*)    Albumin 2.2 (*)    AST 66 (*)    Alkaline Phosphatase 170 (*)    Total Bilirubin 7.8 (*)    GFR, Estimated 36 (*)    All other components within normal limits  RESP PANEL BY RT-PCR (FLU A&B, COVID) ARPGX2  MAGNESIUM  AMMONIA  LACTIC ACID, PLASMA  URINALYSIS, ROUTINE W REFLEX MICROSCOPIC  TSH    EKG None  Radiology DG Chest Port 1 View  Result Date: 03/27/2021 CLINICAL DATA:  Chest pain EXAM: PORTABLE CHEST 1 VIEW COMPARISON:  10/21/2020 FINDINGS: There is  poor inspiration. Transverse diameter of heart is increased. There are no signs of alveolar pulmonary edema. There are new patchy linear densities in left lower lung fields. There is no significant pleural effusion or pneumothorax. IMPRESSION: Poor inspiration. New patchy linear densities seen in the left lower lung fields suggesting subsegmental atelectasis/pneumonitis. There is no pleural effusion or pneumothorax. Reading location: [place], [place]. Electronically Signed   By: Elmer Picker M.D.   On: 04/01/2021 10:19    Procedures Procedures    Medications Ordered in ED Medications  cefTRIAXone (ROCEPHIN) 1 g in sodium chloride 0.9 % 100 mL IVPB (has no administration in time range)  azithromycin (ZITHROMAX) 500 mg in sodium chloride 0.9 % 250 mL IVPB (has no administration in time range)  sodium chloride 0.9 % bolus 1,000 mL (1,000 mLs Intravenous New Bag/Given 03/14/2021 1040)    ED Course  I have reviewed the triage vital signs and the nursing notes.  Pertinent labs & imaging results that were available during my care of the patient were reviewed by me and considered in my medical decision making (see chart for details).  Patient seen and examined.  She appears very chronically ill and deconditioned.  Per tech, she was very wobbly on her feet after standing and pivoting.  I do not think that she is going to be safe to go home at this point.  Additional work-up ordered.  Was hypotensive on arrival which is atypical per past clinic visits.  Vital signs reviewed and are as follows: BP (!) 118/38   Pulse 63   Temp 97.6 F (36.4 C) (Oral)   Resp 17   SpO2 100%   10:43 AM chest x-ray with left lower lung field pneumonitis versus atelectasis.  Blood pressures downtrending, latest 78/39.   10:53 AM patient appears stable.  I did have a brief discussion with the patient regarding CODE STATUS.  I asked her if she needed to have chest compressions or breathing tube placed in her throat, would she want this.  She states several times that she would want to be "put to rest".   1:42 PM Patient has been persistently hypotensive with maps around 60, not responsive to 2 L of IV fluids.  Dr. Francia Greaves has seen patient as well.  She does have some encephalopathy, however is oriented and is very clear that she would not want to be intubated or have CPR.  It is difficult to have a conversation with her regarding more subtle interventions such as vasopressors.   We will recheck second lactate.   Fluids given 2/2 concern for sepsis but 30cc/kg  not given due to anasarca and large volume ascites.   I have spoken with critical care and requested their input regarding this case.  Patient does not appear well and did not have a good prognosis even prior to her recent decline.  She would benefit from palliative care consult in hospital.  Will continue to monitor, awaiting critical care recommendations.  2:11 PM PCCM has seen. Dr. Silas Flood to admit.   CRITICAL CARE Performed by: Carlisle Cater PA-C Total critical care time: 55 minutes Critical care time was exclusive of separately billable procedures and treating other patients. Critical care was necessary to treat or prevent imminent or life-threatening deterioration. Critical care was time spent personally by me on the following activities: development of treatment plan with patient and/or surrogate as well as nursing, discussions with consultants, evaluation of patient's response to treatment, examination of patient, obtaining history from patient or  surrogate, ordering and performing treatments and interventions, ordering and review of laboratory studies, ordering and review of radiographic studies, pulse oximetry and re-evaluation of patient's condition.    MDM Rules/Calculators/A&P                           Admit.   Final Clinical Impression(s) / ED Diagnoses Final diagnoses:  Sepsis with acute liver failure and septic shock, due to unspecified organism, unspecified whether hepatic coma present [facility])  Decompensated hepatic cirrhosis [facility])    Rx / DC Orders ED Discharge Orders     None        Carlisle Cater, PA-C 04/05/2021 1414    Carlisle Cater, PA-C 04/04/2021 1428    Valarie Merino, MD 04/04/21 [id number]

## 2021-04-03 NOTE — ED Notes (Signed)
Unable to get temp on Pt.

## 2021-04-03 NOTE — ED Notes (Signed)
Selena Johnson Friend, [address], [place], [phone]

## 2021-04-03 NOTE — Progress Notes (Signed)
[facility] Progress Note Patient Name: Selena Johnson DOB: [birth year] MRN: [id number]   Date of Service  03/22/2021  HPI/Events of Note  Bedside RN requests clarification of whether to place NG tube for Lactulose administration for ammonia level of 83.  eICU Interventions  Patient's POA Ms. Lowry Bowl wants to defer insertion of NG tube overnight so that she can confer with family.        Kerry Kass Halston Kintz 04/02/2021, 9:05 PM

## 2021-04-03 NOTE — Progress Notes (Signed)
Pt laying in bed yelling "help me." When asked what is wrong pt repeatedly states "let me die." Repositioned pt for comfort and PRN dose of Fentanyl given, due to patient reporting severe generalized pain. After administration, pt resting comfortable in bed with eyes closed.  Called [facility] to clarify Lactulose order and if NG is wanted. Pt unable to safely take PO meds, and MD note from earlier states "NG tube for Lactulose" however, currently no order to insert NG tube. Per day MD note pt is DNR and "No invasive procedure otherwise, no central line etc."

## 2021-04-03 NOTE — ED Triage Notes (Signed)
Patient reports nausea , fatigue , generalized weakness with headache onset this week .

## 2021-04-03 NOTE — ED Notes (Signed)
Pt jaundiced, distended abdomen with edematous bil lower extremities. Pt slow to respond, unable to give detailed history.

## 2021-04-03 NOTE — H&P (Addendum)
NAME:  Selena Johnson, MRN:  [id number], DOB:  [birth year], LOS: 0 ADMISSION DATE:  03/10/2021, CONSULTATION DATE:  03/27/2021 REFERRING MD:  Dr. Francia Greaves, CHIEF COMPLAINT:  Encephalopathy    History of Present Illness:  Selena Johnson is a 70 y.o. female with a PMH significant for decompensated hepatic cirrhosis secondary to NASH, ascites, HTN, basal cell CA and breast cancer, HLD, and allergies who presented to the ED for complaints of nausea, fatigues, and generalized weakness.   On admission to the ED patient was seen hypotensive and bradycardic. Lab work significant for Na 125, K 5.7, Cl 93, Creatinine 1.54, AST 66, Alk phos 170, lactic acid 3.4, ammonia 83 and WBC 15.4. Given sepsis criteria with severe hypotension PCCM consulted for further management and admission.  Of note patient recently followed up with primary GI who recommended palliative are consult in order to help determine goals of care in the setting of decompensation cirrhosis and failure to thrive. PCCM team contacted patient next of kin Selena Johnson (friend) and confirmed that patient would want to be made a DNR but she was comfortable with peripheral pressors, antibiotics, and IV fluids.   Pertinent  Medical History  Decompensated hepatic cirrhosis secondary to NASH, ascites, HTN, basal cell CA and breast cancer, HLD, and allergies  Significant Hospital Events  10/29 admitted and met sepsis criteria on arrival   Interim History / Subjective:  As above   Objective   Blood pressure (!) 86/44, pulse (!) 59, temperature 97.6 F (36.4 C), temperature source Oral, resp. rate 12, SpO2 99 %.       No intake or output data in the 24 hours ending 03/28/2021 1318 There were no vitals filed for this visit.  Examination: General: Acute on chronically ill appearing very deconditioned elderly female lying in bed, in NAD HEENT: [place]/AT, MM pink/moist, PERRL, jaundice  Neuro: Alert and able to state name but with each other  orientation question patient states "I want to die"  CV: s1s2 regular rate and rhythm, no murmur, rubs, or gallops,  PULM:  Clear to ascultation, no increased work of breathing, oxygen saturations appropriate on RA  GI: soft, bowel sounds hypoactive in all 4 quadrants, non-tender, very distended. Diagnostic paracentesis preformed in ED see image of fluid below   Extremities: warm/dry, 2-3 + generalized edema, skin wheeping  Skin: no rashes or lesions  Resolved Hospital Problem list     Assessment & Plan:  Decompensated  hepatic cirrhosis secondary to NASH Ascites  -Follow with Dr.Gessner -MELD score 31 Concern for SBP P: Antibiotics as below NG tube for Lactulose  Weight based albumin  Consider GI consult  Hold home lasix  Sever sepsis -Criteria includes bradycardia, hypotension, leukocytosis with WBC 15.4, lactici acid of 3.4, and acute concern for infection P: Admit ICU Supplemental oxygen as needed for MAP greater than 65 Follow cultures  IV ceftriaxone Peripheral Levo, no escalation for central line  Trend lactic acid Monitor urine output Capillary refill:  Hx of HTN/HLD  -Home medications include lasix, Toprol XL, and aldactone  P: Hold home antihypertensives  Continuous telemetry   Failure to thrive  Patient recently followed up with primary GI who recommended palliative care consult in order to help determine goals of care in the setting of decompensation cirrhosis and failure to thrive. Per chart patient was scheduled to meet with palliative care but was hospitalized before meeting occurred.  PCCM team contacted patients next of kin Selena Johnson (friend) and confirmed that patient would  want to be made a DNR but she was comfortable with peripheral pressors, antibiotics, and IV fluids.   Best Practice (right click and "Reselect all SmartList Selections" daily)   Diet/type: NPO DVT prophylaxis: SCD GI prophylaxis: PPI Lines: N/A Foley:  N/A Code Status:   DNR Last date of multidisciplinary goals of care discussion:  PCCM team contacted patient next of kin Selena Johnson (friend) and confirmed that patient would want to be made a DNR but she was comfortable with peripheral pressors, antibiotics, and IV fluids. Depending on patients response to therapy may need to consider palliative care consult   Labs   CBC: Recent Labs  Lab 03/24/2021 0453  WBC 15.4*  NEUTROABS 14.3*  HGB 12.4  HCT 34.1*  MCV 103.3*  PLT PLATELET CLUMPS NOTED ON SMEAR, COUNT APPEARS DECREASED    Basic Metabolic Panel: Recent Labs  Lab 03/29/21 0916 03/28/2021 0453 03/25/2021 0926  NA 125* 125*  --   K 5.6 No hemolysis seen* 5.7*  --   CL 93* 93*  --   CO2 25 21*  --   GLUCOSE 97 83  --   BUN 43* 52*  --   CREATININE 1.14 1.54*  --   CALCIUM 9.2 9.0  --   MG  --   --  2.3   GFR: CrCl cannot be calculated (Unknown ideal weight.). Recent Labs  Lab 03/12/2021 0453 03/17/2021 0926  WBC 15.4*  --   LATICACIDVEN  --  3.4*    Liver Function Tests: Recent Labs  Lab 04/05/2021 0453  AST 66*  ALT 43  ALKPHOS 170*  BILITOT 7.8*  PROT 6.2*  ALBUMIN 2.2*   Recent Labs  Lab 03/18/2021 0926  LIPASE 48   Recent Labs  Lab 03/24/2021 0926  AMMONIA 83*    ABG No results found for: PHART, PCO2ART, PO2ART, HCO3, TCO2, ACIDBASEDEF, O2SAT   Coagulation Profile: No results for input(s): INR, PROTIME in the last 168 hours.  Cardiac Enzymes: No results for input(s): CKTOTAL, CKMB, CKMBINDEX, TROPONINI in the last 168 hours.  HbA1C: Hgb A1c MFr Bld  Date/Time Value Ref Range Status  08/24/2020 08:52 AM 4.5 (L) 4.6 - 6.5 % Final    Comment:    Glycemic Control Guidelines for People with Diabetes:Non Diabetic:  <6%Goal of Therapy: <7%Additional Action Suggested:  >8%   08/08/2019 10:07 AM 4.3 (L) 4.6 - 6.5 % Final    Comment:    Glycemic Control Guidelines for People with Diabetes:Non Diabetic:  <6%Goal of Therapy: <7%Additional Action Suggested:  >8%      CBG: No results for input(s): GLUCAP in the last 168 hours.  Review of Systems:   Unable to assess   Past Medical History:  She,  has a past medical history of Allergy, Cancer ([place]), Chronic kidney disease, Conjunctivitis, Fatty liver (2015), GERD (gastroesophageal reflux disease), Glaucoma, Hearing loss in left ear, History of kidney stones, Hyperlipidemia, Hypertension, Menopausal syndrome, NASH (nonalcoholic steatohepatitis), and Primary hypothyroidism.   Surgical History:   Past Surgical History:  Procedure Laterality Date   ABDOMINAL HYSTERECTOMY     partial ? prolapse   BREAST LUMPECTOMY WITH RADIOACTIVE SEED AND SENTINEL LYMPH NODE BIOPSY Left 07/12/2018   Procedure: LEFT BREAST LUMPECTOMY WITH RADIOACTIVE SEED AND LEFT AXILLARY SENTINEL LYMPH NODE BIOPSY;  Surgeon: Rolm Bookbinder, MD;  Location: [facility];  Service: General;  Laterality: Left;   CHOLECYSTECTOMY     COLONOSCOPY     ESOPHAGOGASTRODUODENOSCOPY     IR PARACENTESIS  12/09/2020  IR PARACENTESIS  02/22/2021   IR PARACENTESIS  03/01/2021   IR PARACENTESIS  03/08/2021   TONSILLECTOMY     UPPER GASTROINTESTINAL ENDOSCOPY       Social History:   reports that she quit smoking about 38 years ago. Her smoking use included cigarettes. She has never used smokeless tobacco. She reports current alcohol use. She reports that she does not use drugs.   Family History:  Her family history includes Cancer in her father; Diabetes in her father; Glaucoma in her mother; Heart disease in her father and mother; Hypertension in her father and mother. There is no history of Colon cancer, Esophageal cancer, Rectal cancer, or Stomach cancer.   Allergies Allergies  Allergen Reactions   Codeine     REACTION: Throat swelling   Ivp Dye [Iodinated Diagnostic Agents] Swelling     Home Medications  Prior to Admission medications   Medication Sig Start Date End Date Taking? Authorizing Provider  anastrozole (ARIMIDEX) 1 MG tablet  Take 1 tablet by mouth daily 09/08/20   Magrinat, Virgie Dad, MD  Biotin 2500 MCG CAPS Take 5,000 mcg by mouth daily.    [provider]  Cholecalciferol (VITAMIN D3) 2000 units TABS Take 2,000 Units by mouth daily.    [provider]  furosemide (LASIX) 20 MG tablet Take 2 tablets (40 mg total) by mouth daily. 03/19/21   Gatha Mayer, MD  levothyroxine (SYNTHROID) 200 MCG tablet Take 1 tablet (200 mcg total) by mouth daily before breakfast. 03/30/21   Tower, [name], MD  loratadine (CLARITIN) 10 MG tablet Take 10 mg by mouth daily.    [provider]  metoprolol succinate (TOPROL-XL) 100 MG 24 hr tablet Take 1 by mouth daily with or after a meal 09/07/20   Tower, [name], MD  ondansetron (ZOFRAN) 4 MG tablet Take 1 tablet (4 mg total) by mouth every 8 (eight) hours as needed for nausea or vomiting. 03/19/21   Gatha Mayer, MD  Polyvinyl Alcohol-Povidone PF (REFRESH) 1.4-0.6 % SOLN Place 1 drop into both eyes daily as needed (dry eyes).    [provider]  potassium chloride SA (KLOR-CON) 20 MEQ tablet Take 1 tablet by mouth every day 09/07/20   Tower, [name], MD  spironolactone (ALDACTONE) 50 MG tablet Take 2 tablets (100 mg total) by mouth daily. 03/19/21   Gatha Mayer, MD     Critical care time:    Performed by: Jaevian Shean D. Harris  Total critical care time: 45 minutes  Critical care time was exclusive of separately billable procedures and treating other patients.  Critical care was necessary to treat or prevent imminent or life-threatening deterioration.  Critical care was time spent personally by me on the following activities: development of treatment plan with patient and/or surrogate as well as nursing, discussions with consultants, evaluation of patient's response to treatment, examination of patient, obtaining history from patient or surrogate, ordering and performing treatments and interventions, ordering and review of laboratory studies,  ordering and review of radiographic studies, pulse oximetry and re-evaluation of patient's condition.  Marsean Elkhatib D. Kenton Kingfisher, NP-C [facility] Personal contact information can be found on Amion  03/30/2021, 2:56 PM

## 2021-04-03 NOTE — ED Notes (Signed)
ED PA made aware of pt BP

## 2021-04-03 NOTE — ED Notes (Signed)
When assessing pt vitals, pt stated "I feel like I'm bout to die. I got all my papers in order, how long does it take to die" this tech stated to the pt "Not until God's ready for ya"

## 2021-04-04 DIAGNOSIS — K7290 Hepatic failure, unspecified without coma: Secondary | ICD-10-CM | POA: Diagnosis not present

## 2021-04-04 DIAGNOSIS — R7881 Bacteremia: Secondary | ICD-10-CM

## 2021-04-04 DIAGNOSIS — A419 Sepsis, unspecified organism: Secondary | ICD-10-CM | POA: Diagnosis not present

## 2021-04-04 DIAGNOSIS — K652 Spontaneous bacterial peritonitis: Secondary | ICD-10-CM

## 2021-04-04 DIAGNOSIS — Z66 Do not resuscitate: Secondary | ICD-10-CM | POA: Diagnosis not present

## 2021-04-04 DIAGNOSIS — K7581 Nonalcoholic steatohepatitis (NASH): Secondary | ICD-10-CM

## 2021-04-04 DIAGNOSIS — K7460 Unspecified cirrhosis of liver: Secondary | ICD-10-CM

## 2021-04-04 DIAGNOSIS — R188 Other ascites: Secondary | ICD-10-CM

## 2021-04-04 DIAGNOSIS — R6521 Severe sepsis with septic shock: Secondary | ICD-10-CM

## 2021-04-04 DIAGNOSIS — Z515 Encounter for palliative care: Secondary | ICD-10-CM | POA: Diagnosis not present

## 2021-04-04 LAB — COMPREHENSIVE METABOLIC PANEL
ALT: 28 U/L (ref 0–44)
AST: 71 U/L — ABNORMAL HIGH (ref 15–41)
Albumin: 3.3 g/dL — ABNORMAL LOW (ref 3.5–5.0)
Alkaline Phosphatase: 102 U/L (ref 38–126)
Anion gap: 12 (ref 5–15)
BUN: 61 mg/dL — ABNORMAL HIGH (ref 8–23)
CO2: 17 mmol/L — ABNORMAL LOW (ref 22–32)
Calcium: 9 mg/dL (ref 8.9–10.3)
Chloride: 98 mmol/L (ref 98–111)
Creatinine, Ser: 1.74 mg/dL — ABNORMAL HIGH (ref 0.44–1.00)
GFR, Estimated: 31 mL/min — ABNORMAL LOW (ref >60)
Glucose, Bld: 85 mg/dL (ref 70–99)
Potassium: 6 mmol/L — ABNORMAL HIGH (ref 3.5–5.1)
Sodium: 127 mmol/L — ABNORMAL LOW (ref 135–145)
Total Bilirubin: 6.3 mg/dL — ABNORMAL HIGH (ref 0.3–1.2)
Total Protein: 5.6 g/dL — ABNORMAL LOW (ref 6.5–8.1)

## 2021-04-04 LAB — URINALYSIS, ROUTINE W REFLEX MICROSCOPIC
Bilirubin Urine: NEGATIVE
Glucose, UA: NEGATIVE mg/dL
Ketones, ur: NEGATIVE mg/dL
Nitrite: NEGATIVE
Protein, ur: NEGATIVE mg/dL
Specific Gravity, Urine: 1.014 (ref 1.005–1.030)
pH: 5 (ref 5.0–8.0)

## 2021-04-04 LAB — BLOOD CULTURE ID PANEL (REFLEXED) - BCID2

## 2021-04-04 LAB — CBC
HCT: 28.7 % — ABNORMAL LOW (ref 36.0–46.0)
Hemoglobin: 9.9 g/dL — ABNORMAL LOW (ref 12.0–15.0)
MCH: 37.1 pg — ABNORMAL HIGH (ref 26.0–34.0)
MCHC: 34.5 g/dL (ref 30.0–36.0)
MCV: 107.5 fL — ABNORMAL HIGH (ref 80.0–100.0)
Platelets: 84 10*3/uL — ABNORMAL LOW (ref 150–400)
RBC: 2.67 MIL/uL — ABNORMAL LOW (ref 3.87–5.11)
RDW: 17.2 % — ABNORMAL HIGH (ref 11.5–15.5)
WBC: 13.5 10*3/uL — ABNORMAL HIGH (ref 4.0–10.5)
nRBC: 0 % (ref 0.0–0.2)

## 2021-04-04 LAB — POTASSIUM: Potassium: 5.6 mmol/L — ABNORMAL HIGH (ref 3.5–5.1)

## 2021-04-04 LAB — GLUCOSE, CAPILLARY
Glucose-Capillary: 89 mg/dL (ref 70–99)
Glucose-Capillary: 140 mg/dL — ABNORMAL HIGH (ref 70–99)
Glucose-Capillary: 90 mg/dL (ref 70–99)

## 2021-04-04 LAB — MRSA NEXT GEN BY PCR, NASAL: MRSA by PCR Next Gen: NOT DETECTED

## 2021-04-04 MED ORDER — LORAZEPAM 2 MG/ML IJ SOLN
1.0000 mg | INTRAMUSCULAR | Status: DC | PRN
  Administered 2021-04-04: 1 mg via INTRAVENOUS
  Filled 2021-04-04: qty 1

## 2021-04-04 MED ORDER — FENTANYL CITRATE (PF) 100 MCG/2ML IJ SOLN
50.0000 ug | INTRAMUSCULAR | Status: DC | PRN
  Administered 2021-04-04 (×2): 50 ug via INTRAVENOUS
  Filled 2021-04-04: qty 2

## 2021-04-04 MED ORDER — GLYCOPYRROLATE 1 MG PO TABS
1.0000 mg | ORAL_TABLET | ORAL | Status: DC | PRN
  Filled 2021-04-04: qty 1

## 2021-04-04 MED ORDER — INSULIN ASPART 100 UNIT/ML IV SOLN
5.0000 [IU] | Freq: Once | INTRAVENOUS | Status: AC
  Administered 2021-04-04: 5 [IU] via INTRAVENOUS

## 2021-04-04 MED ORDER — GLYCOPYRROLATE 0.2 MG/ML IJ SOLN: 0.2000 mg | INTRAMUSCULAR | Status: DC | PRN

## 2021-04-04 MED ORDER — DEXTROSE 50 % IV SOLN
1.0000 | Freq: Once | INTRAVENOUS | Status: AC
  Administered 2021-04-04: 50 mL via INTRAVENOUS
  Filled 2021-04-04: qty 50

## 2021-04-04 MED ORDER — DIPHENHYDRAMINE HCL 50 MG/ML IJ SOLN: 25.0000 mg | INTRAMUSCULAR | Status: DC | PRN

## 2021-04-04 MED ORDER — ACETAMINOPHEN 325 MG PO TABS: 650.0000 mg | ORAL_TABLET | Freq: Four times a day (QID) | ORAL | Status: DC | PRN

## 2021-04-04 MED ORDER — LORAZEPAM 2 MG/ML IJ SOLN
2.0000 mg | INTRAMUSCULAR | Status: DC | PRN
  Administered 2021-04-08: 2 mg via INTRAVENOUS
  Filled 2021-04-04: qty 1

## 2021-04-04 MED ORDER — POLYVINYL ALCOHOL 1.4 % OP SOLN
1.0000 [drp] | Freq: Four times a day (QID) | OPHTHALMIC | Status: DC | PRN
  Administered 2021-04-05 – 2021-04-06 (×4): 1 [drp] via OPHTHALMIC
  Filled 2021-04-04: qty 15

## 2021-04-04 MED ORDER — DEXTROSE 5 % IV SOLN: INTRAVENOUS | Status: DC

## 2021-04-04 MED ORDER — ACETAMINOPHEN 650 MG RE SUPP: 650.0000 mg | Freq: Four times a day (QID) | RECTAL | Status: DC | PRN

## 2021-04-04 MED ORDER — FENTANYL CITRATE (PF) 100 MCG/2ML IJ SOLN
25.0000 ug | INTRAMUSCULAR | Status: DC | PRN
  Administered 2021-04-04 – 2021-04-08 (×10): 100 ug via INTRAVENOUS
  Filled 2021-04-04 (×9): qty 2

## 2021-04-04 NOTE — Progress Notes (Signed)
[facility] Progress Note Patient Name: Selena Johnson DOB: 10/10/1950 MRN: [id number]   Date of Service  04/04/2021  HPI/Events of Note  Patient with 999 ml of urine retained in the bladder on bladder scan.  eICU Interventions  Foley catheter ordered.        [name] 04/04/2021, 2:31 AM

## 2021-04-04 NOTE — Progress Notes (Signed)
This chaplain responded to the MD consult for EOL spiritual care.  The Pt. is sleeping comfortably at the time of the visit and [name] is no longer at the bedside.  The chaplain is appreciative of RN-Morgan's update.   The chaplain understands Selena Johnson may return to the hospital today.  The chaplain will attempt to revisit at the best time for everyone. The chaplain understands a phone call is possible.  Chaplain Sallyanne Kuster [phone]

## 2021-04-04 NOTE — Progress Notes (Signed)
Met with pt's POA, [name].  Discussed current status and concern for meaningful recover.  It is clear that Ms. Ferran wouldn't want to continue to suffer from her health conditions.  As such decision made to transition to comfort measures.  Anticipate that she will pass away in the hospital.  Vineet Sood, MD [facility] Pager - [phone] 04/04/2021, 11:20 AM  

## 2021-04-04 NOTE — H&P (Signed)
NAME:  Selena Johnson, MRN:  [id number], DOB:  [birth year], LOS: 1 ADMISSION DATE:  03/23/2021, CONSULTATION DATE:  03/21/2021 REFERRING MD:  Dr. Francia Greaves, CHIEF COMPLAINT:  Encephalopathy    History of Present Illness:  70 yo female with hx of cirrhosis with ascites from NASH presented with nausea, fatigue, and weakness.  Found to have SBP with GNR bacteremia.  Outpt GI recently recommended palliative/hospice care, and patient had appointment with palliative care in November to discuss.  POA asked to see if she could improve with course of antibiotics.  Opted against central line placement.  DNR.  Pertinent  Medical History  Decompensated hepatic cirrhosis secondary to NASH, ascites, HTN, basal cell CA and breast cancer, HLD, and allergies  Significant Hospital Events  10/29 admit, pressors through peripheral IV, DNR  Interim History / Subjective:  Needing frequent doses of fentanyl overnight.  C/o abdominal pain.  Objective   Blood pressure (!) 87/50, pulse 62, temperature (!) 95.9 F (35.5 C), temperature source Axillary, resp. rate (!) 9, weight 85.4 kg, SpO2 97 %.        Intake/Output Summary (Last 24 hours) at 04/04/2021 0811 Last data filed at 04/04/2021 0700 Gross per 24 hour  Intake 1111.82 ml  Output 365 ml  Net 746.82 ml   Filed Weights   03/26/2021 8184 04/04/21 0430  Weight: 87.4 kg 85.4 kg    Examination:  General - ill appearing Eyes - jaundice ENT - dry mucosa Cardiac - bradycardic Chest - decreased breath sounds Abdomen - distended, diffusely tender, dull to percussion Extremities - decreased muscle bulk, 2+ edema Skin - multiple areas of ecchymosis Neuro - moans intermittently, when asked what her name is says it's "baa"  Resolved Hospital Problem list     Assessment & Plan:   Septic shock from SBP with GNR bacteremia. - pressors through peripheral IV - continue ABx  Cirrhosis with ascites 2nd to NASH. - poor prognosis, comfort care likely best  option at this time  Hyponatremia, hyperkalemia, non gap metabolic acidosis, AKI from ATN 2nd to sepsis. - defer additional lab draws  Anemia of critical illness and chronic disease. Thrombocytopenia in setting of sepsis and cirrhosis. - defer additional lab draws  Severe protein calorie malnutrition. - unable to take oral feedings  Acute metabolic encephalopathy with hepatic encephalopathy and sepsis. - POA opted against NG tube placement for lactulose  Goals of care. - DNR - will need to d/w POA about likely transitioning to comfort measures  Best Practice (right click and "Reselect all SmartList Selections" daily)   Diet/type: NPO DVT prophylaxis: SCD GI prophylaxis: PPI Lines: N/A Foley:  N/A Code Status:  DNR  Labs    CMP Latest Ref Rng & Units 04/04/2021 04/05/2021 03/29/2021  Glucose 70 - 99 mg/dL 85 83 97  BUN 8 - 23 mg/dL 61(H) 52(H) 43(H)  Creatinine 0.44 - 1.00 mg/dL 1.74(H) 1.54(H) 1.14  Sodium 135 - 145 mmol/L 127(L) 125(L) 125(L)  Potassium 3.5 - 5.1 mmol/L 6.0(H) 5.7(H) 5.6 No hemolysis seen(H)  Chloride 98 - 111 mmol/L 98 93(L) 93(L)  CO2 22 - 32 mmol/L 17(L) 21(L) 25  Calcium 8.9 - 10.3 mg/dL 9.0 9.0 9.2  Total Protein 6.5 - 8.1 g/dL 5.6(L) 6.2(L) -  Total Bilirubin 0.3 - 1.2 mg/dL 6.3(H) 7.8(H) -  Alkaline Phos 38 - 126 U/L 102 170(H) -  AST 15 - 41 U/L 71(H) 66(H) -  ALT 0 - 44 U/L 28 43 -    CBC Latest Ref  Rng & Units 04/04/2021 03/24/2021 03/23/2021  WBC 4.0 - 10.5 K/uL 13.5(H) 15.4(H) 10.6(H)  Hemoglobin 12.0 - 15.0 g/dL 9.9(L) 12.4 11.7(L)  Hematocrit 36.0 - 46.0 % 28.7(L) 34.1(L) 33.6(L)  Platelets 150 - 400 K/uL 84(L) PLATELET CLUMPS NOTED ON SMEAR, COUNT APPEARS DECREASED 152.0   Critical care time: 33 minutes  Chesley Mires, MD [facility] Pager - [phone] 04/04/2021, 8:22 AM

## 2021-04-04 NOTE — Progress Notes (Addendum)
[facility] Progress Note Patient Name: Selena Johnson DOB: 09/21/1950 MRN: [id number]   Date of Service  04/04/2021  HPI/Events of Note  K+ 6.0, patient without oral access (no NG tube due to POA not wanting to put her through the discomfort, also rectal access unavailable for the same reason.)  eICU Interventions  D  50 % water + 5 units of insulin ordered per iv hyperkalemia treatment protocol. Will repeat K+ per protocol.        Kerry Kass [name] 04/04/2021, 4:45 AM

## 2021-04-04 NOTE — Progress Notes (Signed)
Daily Progress Note   Patient Name: Selena Johnson       Date: 04/04/2021 DOB: 06-15-1950  Age: 70 y.o. MRN#: [id number] Attending Physician: Chesley Mires, MD Primary Care Physician: Tower, [name], MD Admit Date: 03/13/2021  Reason for Consultation/Follow-up: end of life care  Subjective: Chart reviewed. Note that patient was transitioned to comfort care earlier today per [place] discussion with PCCM.   Patient appears comfortable. Unresponsive to voice and light touch. No non-verbal signs of pain or discomfort noted. Respirations are even and unlabored. No excessive respiratory secretions noted.  Patient's friend Malachy Mood and her husband Iona Beard are present at bedside. Education and counseling provided on expectations at EOL. Emotional support provided.     Length of Stay: 1  Current Medications: Scheduled Meds:   mouth rinse  15 mL Mouth Rinse BID    Continuous Infusions:  sodium chloride 10 mL/hr at 04/04/21 1300    PRN Meds: acetaminophen **OR** acetaminophen, diphenhydrAMINE, fentaNYL (SUBLIMAZE) injection, glycopyrrolate **OR** glycopyrrolate **OR** glycopyrrolate, lip balm, LORazepam, polyvinyl alcohol  Physical Exam Vitals reviewed.  Constitutional:      Appearance: She is ill-appearing.  Pulmonary:     Effort: Pulmonary effort is normal.  Abdominal:     General: There is distension.  Skin:    Coloration: Skin is jaundiced.  Neurological:     Mental Status: She is unresponsive.            Vital Signs: BP (!) 74/53   Pulse 60   Temp (!) 95.9 F (35.5 C) (Axillary)   Resp (!) 9   Wt 85.4 kg   SpO2 95%   BMI 31.33 kg/m  SpO2: SpO2: 95 % O2 Device: O2 Device: Room Air O2 Flow Rate:        Palliative Assessment/Data: PPS 10%     Palliative Care Assessment &  Plan   HPI/Patient Profile: 70 y.o. female  with past medical history of decompensated hepatic cirrhosis secondary to NASH, ascites, breast cancer, basal cell carcinoma, HTN, and HLD who presented to the emergency department on 03/12/2021 with complaints of nausea, fatigue, and generalized weakness. On admission to the ED, patient was hypotensive and bradycardic. She was admitted to PCCM with septic shock due to presumed SBP.     Assessment: - septic shock secondary to SBP with gram negative rod  bacteremia - cirrhosis with ascites secondary to NASH - AKI secondary to sepsis - end of life care  Recommendations/Plan: Continue full comfort care PRN medications are available for symptom management to ensure EOL comfort PMT will continue to support  Symptom Management:  Fentanyl prn for pain or dyspnea Lorazepam (ATIVAN) prn for anxiety Glycopyrrolate (ROBINUL) for excessive secretions Ondansetron (ZOFRAN) prn for nausea Polyvinyl alcohol (LIQUIFILM TEARS) prn for dry eyes   Code Status: DNR/DNI  Prognosis:  Hours - Days  Discharge Planning: Anticipated Hospital Death    Thank you for allowing the Palliative Medicine Team to assist in the care of this patient.   Total Time 25 minutes Prolonged Time Billed  no       Greater than 50%  of this time was spent counseling and coordinating care related to the above assessment and plan.  Lavena Bullion, NP  Please contact Palliative Medicine Team phone at [phone] for questions and concerns.

## 2021-04-05 DIAGNOSIS — A419 Sepsis, unspecified organism: Secondary | ICD-10-CM | POA: Diagnosis not present

## 2021-04-05 DIAGNOSIS — R6521 Severe sepsis with septic shock: Secondary | ICD-10-CM | POA: Diagnosis not present

## 2021-04-05 LAB — PATHOLOGIST SMEAR REVIEW

## 2021-04-05 NOTE — Progress Notes (Signed)
NAME:  Selena Johnson, MRN:  [id number], DOB:  May 17, 1951, LOS: 2 ADMISSION DATE:  03/21/2021, CONSULTATION DATE:  03/27/2021 REFERRING MD:  Dr. Francia Greaves, CHIEF COMPLAINT:  Encephalopathy    History of Present Illness:  70 yo female with hx of cirrhosis with ascites from NASH presented with nausea, fatigue, and weakness.  Found to have SBP with GNR bacteremia.  Outpt GI recently recommended palliative/hospice care, and patient had appointment with palliative care in November to discuss.  POA asked to see if she could improve with course of antibiotics.  Opted against central line placement.  DNR.  Pertinent  Medical History  Decompensated hepatic cirrhosis secondary to NASH, ascites, HTN, basal cell CA and breast cancer, HLD, and allergies  Significant Hospital Events  10/29 admit, pressors through peripheral IV, DNR  Interim History / Subjective:  Appears comfortable   Objective   Blood pressure (!) 77/52, pulse (!) 54, temperature (!) 95.9 F (35.5 C), temperature source Axillary, resp. rate 20, weight 85.4 kg, SpO2 99 %.        Intake/Output Summary (Last 24 hours) at 04/05/2021 0919 Last data filed at 04/05/2021 0000 Gross per 24 hour  Intake 90.49 ml  Output 15 ml  Net 75.49 ml   Filed Weights   03/22/2021 3734 04/04/21 0430  Weight: 87.4 kg 85.4 kg    Examination:  General - ill appearing Eyes - jaundice ENT - dry mucosa Cardiac - s1s2 brady Chest - shallow, decreased breath sounds Abdomen - distended Extremities - decreased muscle bulk, 2+ edema Skin - multiple areas of ecchymosis Neuro - minimally responsive, moans   Resolved Hospital Problem list     Assessment & Plan:   Septic shock from SBP with GNR bacteremia. Cirrhosis with ascites 2nd to NASH. Hyponatremia, hyperkalemia, non gap metabolic acidosis, AKI from ATN 2nd to sepsis. Anemia of critical illness and chronic disease. Thrombocytopenia in setting of sepsis and cirrhosis. Severe protein calorie  malnutrition. Acute metabolic encephalopathy with hepatic encephalopathy and sepsis.  DNR  Continue comfort measures - PRN fentanyl  Can tx out of ICU, will ask TR[facility] to assume care 11/1  Best Practice (right click and "Reselect all SmartList Selections" daily)   Diet/type: NPO DVT prophylaxis: SCD GI prophylaxis: PPI Lines: N/A Foley:  N/A Code Status:  DNR  Labs    CMP Latest Ref Rng & Units 04/04/2021 04/04/2021 03/08/2021  Glucose 70 - 99 mg/dL - 85 83  BUN 8 - 23 mg/dL - 61(H) 52(H)  Creatinine 0.44 - 1.00 mg/dL - 1.74(H) 1.54(H)  Sodium 135 - 145 mmol/L - 127(L) 125(L)  Potassium 3.5 - 5.1 mmol/L 5.6(H) 6.0(H) 5.7(H)  Chloride 98 - 111 mmol/L - 98 93(L)  CO2 22 - 32 mmol/L - 17(L) 21(L)  Calcium 8.9 - 10.3 mg/dL - 9.0 9.0  Total Protein 6.5 - 8.1 g/dL - 5.6(L) 6.2(L)  Total Bilirubin 0.3 - 1.2 mg/dL - 6.3(H) 7.8(H)  Alkaline Phos 38 - 126 U/L - 102 170(H)  AST 15 - 41 U/L - 71(H) 66(H)  ALT 0 - 44 U/L - 28 43    CBC Latest Ref Rng & Units 04/04/2021 03/07/2021 03/23/2021  WBC 4.0 - 10.5 K/uL 13.5(H) 15.4(H) 10.6(H)  Hemoglobin 12.0 - 15.0 g/dL 9.9(L) 12.4 11.7(L)  Hematocrit 36.0 - 46.0 % 28.7(L) 34.1(L) 33.6(L)  Platelets 150 - 400 K/uL 84(L) PLATELET CLUMPS NOTED ON SMEAR, COUNT APPEARS DECREASED 152.0   Nickolas Madrid, NP Pulmonary/Critical Care Medicine  04/05/2021  9:19 AM

## 2021-04-06 DIAGNOSIS — G9341 Metabolic encephalopathy: Secondary | ICD-10-CM

## 2021-04-06 LAB — CULTURE, BLOOD (ROUTINE X 2): Special Requests: ADEQUATE

## 2021-04-06 MED ORDER — CHLORHEXIDINE GLUCONATE CLOTH 2 % EX PADS: 6.0000 | MEDICATED_PAD | Freq: Every day | CUTANEOUS | Status: DC

## 2021-04-06 MED ORDER — FENTANYL 2500MCG IN NS 250ML (10MCG/ML) PREMIX INFUSION
50.0000 ug/h | INTRAVENOUS | Status: DC
  Administered 2021-04-06: 50 ug/h via INTRAVENOUS
  Administered 2021-04-08: 150 ug/h via INTRAVENOUS
  Filled 2021-04-06 (×2): qty 250

## 2021-04-06 MED ORDER — FENTANYL BOLUS VIA INFUSION
25.0000 ug | INTRAVENOUS | Status: DC | PRN
  Administered 2021-04-08: 50 ug via INTRAVENOUS
  Filled 2021-04-06: qty 50

## 2021-04-06 NOTE — Progress Notes (Signed)
Daily Progress Note   Patient Name: Selena Johnson       Date: 04/06/2021 DOB: 12/03/1950  Age: 70 y.o. MRN#: [id number] Attending Physician: Flora Lipps, MD Primary Care Physician: Tower, [name], MD Admit Date: 03/27/2021  Reason for Consultation/Follow-up: end of life care, symptom management  Subjective: Patient appears uncomfortable. She is intermittently moaning and crying out, but not able to respond to any questions. Bedside RN reports she has been requiring frequent doses of prn IV fentanyl.   Length of Stay: 3   Physical Exam Constitutional:      Appearance: She is ill-appearing.  Pulmonary:     Effort: Pulmonary effort is normal.  Abdominal:     General: There is distension.  Skin:    Coloration: Skin is jaundiced.  Psychiatric:        Speech: She is noncommunicative.            Vital Signs: BP 111/74   Pulse 67   Temp (!) 94 F (34.4 C) (Axillary)   Resp (!) 4   Wt 85.4 kg   SpO2 96%   BMI 31.33 kg/m  SpO2: SpO2: 96 % O2 Device: O2 Device: Room Air O2 Flow Rate:          Palliative Assessment/Data: PPS 10%      Palliative Care Assessment & Plan   HPI/Patient Profile: 69 y.o. female  with past medical history of decompensated hepatic cirrhosis secondary to NASH, ascites, breast cancer, basal cell carcinoma, HTN, and HLD who presented to the emergency department on 03/13/2021 with complaints of nausea, fatigue, and generalized weakness. On admission to the ED, patient was hypotensive and bradycardic. She was admitted to PCCM with septic shock due to presumed SBP.      Assessment: - septic shock secondary to SBP with gram negative rod bacteremia - cirrhosis with ascites secondary to NASH - AKI secondary to sepsis - end of life  care  Recommendations/Plan: Continue comfort care Start fentanyl infusion  Titrate up by 25 mcg every 15 minutes as needed for uncontrolled pain or dyspnea.  Prior to any rate increase, please administer a bolus dose of 25-50 mcg over 2 minutes.  Goals of Care and Additional Recommendations: Limitations on Scope of Treatment: Full Comfort Care  Code Status: DNR/DNI   Prognosis:  Hours - Days  Discharge Planning: Anticipated Hospital Death    Thank you for allowing the Palliative Medicine Team to assist in the care of this patient.   Total Time 20 minutes Prolonged Time Billed  no       Greater than 50%  of this time was spent counseling and coordinating care related to the above assessment and plan.  Selena Bullion, NP  Please contact Palliative Medicine Team phone at [phone] for questions and concerns.

## 2021-04-06 NOTE — Progress Notes (Signed)
PROGRESS NOTE  LATYSHA THACKSTON KPQ:[id number] DOB: 11/12/1950 DOA: 03/09/2021 PCP: Abner Greenspan, MD   LOS: 3 days   Brief narrative: Patient is a 70 years old female with history of cirrhosis of liver and ascites from nonalcoholic steatohepatitis presented to the hospital with nausea vomiting fatigue and weakness.  She was noted to have spontaneous bacterial peritonitis with gram-negative bacteremia.  Outpatient GI had recently recommended palliative care and hospice but then patient was admitted hospital for decompensated liver cirrhosis.  Initially, patient received vasopressors through peripheral line.  Subsequently goals of care was discussed and plan was to proceed with comfort care.  Patient was then transferred out of the ICU.  Assessment/Plan:  Active Problems:   Septic shock (HCC)  Decompensated cirrhosis of liver with ascites from nonalcoholic steatohepatitis.  Currently on comfort care.  Spontaneous bacterial peritonitis with gram-negative bacteremia septic shock patient did have a Pasteurella species. Currently on comfort care.    History of breast cancer basal cell cancer.  Anemia of critical illness and chronic disease.  Thrombocytopenia in the setting of cirrhosis of liver and sepsis.  No further work-up planned.  Severe protein calorie calorie malnutrition.  Present on admission.  On comfort care at this time.  Acute metabolic encephalopathy secondary to hepatic encephalopathy and sepsis.  Acute kidney injury-improved.  DVT prophylaxis:   None for comfort   Code Status: DNR  Family Communication:  Not at bedside  Status is: Inpatient  Remains inpatient appropriate because: On comfort care   Consultants: PCCM Palliative care  Procedures: None  Anti-infectives:  None   Subjective: Today, patient was seen and examined at bedside.  Patient appears weak with intermittent moaning.  Moving extremities.  Objective: Vitals:   04/06/21 1003 04/06/21  1004  BP:    Pulse: (!) 59 (!) 57  Resp: (!) 7 (!) 7  Temp:    SpO2: 97% 97%    Intake/Output Summary (Last 24 hours) at 04/06/2021 1200 Last data filed at 04/06/2021 1100 Gross per 24 hour  Intake 213.69 ml  Output 50 ml  Net 163.69 ml   Filed Weights   03/06/2021 0938 04/04/21 0430  Weight: 87.4 kg 85.4 kg   Body mass index is 31.33 kg/m.   Physical Exam: GENERAL: Patient is mildly responsive, deconditioned and weak, moaning, incomprehensible speech, not in obvious distress. HENT: No scleral pallor or icterus.  Mucosa mildly dry. NECK: is supple, no gross swelling noted. CHEST:  Diminished breath sounds bilaterally. CVS: S1 and S2 heard,   ABDOMEN: Soft, non-tender, bowel sounds are present. EXTREMITIES: No edema. CNS: Moaning, moves extremities SKIN: warm and dry without rashes.  Data Review: I have personally reviewed the following laboratory data and studies,  CBC: Recent Labs  Lab 03/17/2021 0453 04/04/21 0245  WBC 15.4* 13.5*  NEUTROABS 14.3*  --   HGB 12.4 9.9*  HCT 34.1* 28.7*  MCV 103.3* 107.5*  PLT PLATELET CLUMPS NOTED ON SMEAR, COUNT APPEARS DECREASED 84*   Basic Metabolic Panel: Recent Labs  Lab 04/05/2021 0453 03/16/2021 0926 04/04/21 0245 04/04/21 0706  NA 125*  --  127*  --   K 5.7*  --  6.0* 5.6*  CL 93*  --  98  --   CO2 21*  --  17*  --   GLUCOSE 83  --  85  --   BUN 52*  --  61*  --   CREATININE 1.54*  --  1.74*  --   CALCIUM 9.0  --  9.0  --  MG  --  2.3  --   --    Liver Function Tests: Recent Labs  Lab 03/13/2021 0453 04/04/21 0245  AST 66* 71*  ALT 43 28  ALKPHOS 170* 102  BILITOT 7.8* 6.3*  PROT 6.2* 5.6*  ALBUMIN 2.2* 3.3*   Recent Labs  Lab 03/11/2021 0926  LIPASE 48   Recent Labs  Lab 03/11/2021 0926  AMMONIA 83*   Cardiac Enzymes: No results for input(s): CKTOTAL, CKMB, CKMBINDEX, TROPONINI in the last 168 hours. BNP (last 3 results) Recent Labs    10/21/20 1130  BNP 195.0*    ProBNP (last 3 results) No  results for input(s): PROBNP in the last 8760 hours.  CBG: Recent Labs  Lab 04/04/2021 1932 03/28/2021 2318 04/04/21 0327 04/04/21 0545 04/04/21 0708  GLUCAP 100* 88 89 140* 90   Recent Results (from the past 240 hour(s))  Resp Panel by RT-PCR (Flu A&B, Covid) Nasopharyngeal Swab     Status: None   Collection Time: 03/19/2021  9:27 AM   Specimen: Nasopharyngeal Swab; Nasopharyngeal(NP) swabs in vial transport medium  Result Value Ref Range Status   SARS Coronavirus 2 by RT PCR NEGATIVE NEGATIVE Final    Comment: (NOTE) SARS-CoV-2 target nucleic acids are NOT DETECTED.  The SARS-CoV-2 RNA is generally detectable in upper respiratory specimens during the acute phase of infection. The lowest concentration of SARS-CoV-2 viral copies this assay can detect is 138 copies/mL. A negative result does not preclude SARS-Cov-2 infection and should not be used as the sole basis for treatment or other patient management decisions. A negative result may occur with  improper specimen collection/handling, submission of specimen other than nasopharyngeal swab, presence of viral mutation(s) within the areas targeted by this assay, and inadequate number of viral copies(<138 copies/mL). A negative result must be combined with clinical observations, patient history, and epidemiological information. The expected result is Negative.  Fact Sheet for Patients:  EntrepreneurPulse.com.au  Fact Sheet for Healthcare Providers:  IncredibleEmployment.be  This test is no t yet approved or cleared by the [place] FDA and  has been authorized for detection and/or diagnosis of SARS-CoV-2 by FDA under an Emergency Use Authorization (EUA). This EUA will remain  in effect (meaning this test can be used) for the duration of the COVID-19 declaration under Section 564(b)(1) of the Act, 21 U.S.C.section 360bbb-3(b)(1), unless the authorization is terminated  or revoked sooner.        Influenza A by PCR NEGATIVE NEGATIVE Final   Influenza B by PCR NEGATIVE NEGATIVE Final    Comment: (NOTE) The Xpert Xpress SARS-CoV-2/FLU/RSV plus assay is intended as an aid in the diagnosis of influenza from Nasopharyngeal swab specimens and should not be used as a sole basis for treatment. Nasal washings and aspirates are unacceptable for Xpert Xpress SARS-CoV-2/FLU/RSV testing.  Fact Sheet for Patients: EntrepreneurPulse.com.au  Fact Sheet for Healthcare Providers: IncredibleEmployment.be  This test is not yet approved or cleared by the [place] FDA and has been authorized for detection and/or diagnosis of SARS-CoV-2 by FDA under an Emergency Use Authorization (EUA). This EUA will remain in effect (meaning this test can be used) for the duration of the COVID-19 declaration under Section 564(b)(1) of the Act, 21 U.S.C. section 360bbb-3(b)(1), unless the authorization is terminated or revoked.  Performed at [facility], [place] [address], [place], [place] [address]   Blood culture (routine x 2)     Status: Abnormal   Collection Time: 03/10/2021 12:13 PM   Specimen: BLOOD  Result Value Ref Range Status   Specimen Description BLOOD LEFT ANTECUBITAL  Final   Special Requests   Final    BOTTLES DRAWN AEROBIC AND ANAEROBIC Blood Culture adequate volume   Culture  Setup Time   Final    GRAM NEGATIVE RODS AEROBIC BOTTLE ONLY CRITICAL RESULT CALLED TO, READ BACK BY AND VERIFIED WITH: PHARMD J.MILLEN AT 0911 ON 04/04/2021 BY T.SAAD.    Culture (A)  Final    PASTEURELLA SPECIES Usually susceptible to penicillin and other beta lactam agents,quinolones,macrolides and tetracyclines. Performed at [facility], [place] [address] [place], [place] [address]    Report Status 04/06/2021 FINAL  Final  Blood Culture ID Panel (Reflexed)     Status: None   Collection Time: 03/17/2021 12:13 PM  Result Value Ref Range Status    Enterococcus faecalis NOT DETECTED NOT DETECTED Final   Enterococcus Faecium NOT DETECTED NOT DETECTED Final   Listeria monocytogenes NOT DETECTED NOT DETECTED Final   Staphylococcus species NOT DETECTED NOT DETECTED Final   Staphylococcus aureus (BCID) NOT DETECTED NOT DETECTED Final   Staphylococcus epidermidis NOT DETECTED NOT DETECTED Final   Staphylococcus lugdunensis NOT DETECTED NOT DETECTED Final   Streptococcus species NOT DETECTED NOT DETECTED Final   Streptococcus agalactiae NOT DETECTED NOT DETECTED Final   Streptococcus pneumoniae NOT DETECTED NOT DETECTED Final   Streptococcus pyogenes NOT DETECTED NOT DETECTED Final   A.calcoaceticus-baumannii NOT DETECTED NOT DETECTED Final   Bacteroides fragilis NOT DETECTED NOT DETECTED Final   Enterobacterales NOT DETECTED NOT DETECTED Final   Enterobacter cloacae complex NOT DETECTED NOT DETECTED Final   Escherichia coli NOT DETECTED NOT DETECTED Final   Klebsiella aerogenes NOT DETECTED NOT DETECTED Final   Klebsiella oxytoca NOT DETECTED NOT DETECTED Final   Klebsiella pneumoniae NOT DETECTED NOT DETECTED Final   Proteus species NOT DETECTED NOT DETECTED Final   Salmonella species NOT DETECTED NOT DETECTED Final   Serratia marcescens NOT DETECTED NOT DETECTED Final   Haemophilus influenzae NOT DETECTED NOT DETECTED Final   Neisseria meningitidis NOT DETECTED NOT DETECTED Final   Pseudomonas aeruginosa NOT DETECTED NOT DETECTED Final   Stenotrophomonas maltophilia NOT DETECTED NOT DETECTED Final   Candida albicans NOT DETECTED NOT DETECTED Final   Candida auris NOT DETECTED NOT DETECTED Final   Candida glabrata NOT DETECTED NOT DETECTED Final   Candida krusei NOT DETECTED NOT DETECTED Final   Candida parapsilosis NOT DETECTED NOT DETECTED Final   Candida tropicalis NOT DETECTED NOT DETECTED Final   Cryptococcus neoformans/gattii NOT DETECTED NOT DETECTED Final    Comment: Performed at [facility], [address],  [place], [place] [address]  Blood culture (routine x 2)     Status: None (Preliminary result)   Collection Time: 03/18/2021 12:15 PM   Specimen: BLOOD  Result Value Ref Range Status   Specimen Description BLOOD RIGHT ANTECUBITAL  Final   Special Requests   Final    BOTTLES DRAWN AEROBIC AND ANAEROBIC Blood Culture adequate volume   Culture   Final    NO GROWTH 3 DAYS Performed at [facility], [place] [address], [place], [place] [address]    Report Status PENDING  Incomplete  Body fluid culture w Gram Stain     Status: None (Preliminary result)   Collection Time: 03/24/2021  2:20 PM   Specimen: Peritoneal Washings; Body Fluid  Result Value Ref Range Status   Specimen Description PERITONEAL  Final   Special Requests NONE  Final  Gram Stain   Final    MODERATE WBC PRESENT,BOTH PMN AND MONONUCLEAR NO ORGANISMS SEEN    Culture   Final    NO GROWTH 3 DAYS Performed at [place] [address] [place], [place] [address]    Report Status PENDING  Incomplete  MRSA Next Gen by PCR, Nasal     Status: None   Collection Time: 03/31/2021  8:25 PM   Specimen: Nasal Mucosa; Nasal Swab  Result Value Ref Range Status   MRSA by PCR Next Gen NOT DETECTED NOT DETECTED Final    Comment: (NOTE) The GeneXpert MRSA Assay (FDA approved for NASAL specimens only), is one component of a comprehensive MRSA colonization surveillance program. It is not intended to diagnose MRSA infection nor to guide or monitor treatment for MRSA infections. Test performance is not FDA approved in patients less than 27 years old. Performed at [facility], [place] [address] [place], [place] [address]      Studies: No results found.    Flora Lipps, MD  [facility] 04/06/2021  If 7PM-7AM, please contact night-coverage

## 2021-04-06 DEATH — deceased

## 2021-04-07 LAB — BODY FLUID CULTURE W GRAM STAIN: Culture: NO GROWTH

## 2021-04-07 NOTE — Progress Notes (Signed)
Pt is comfort care. Monitored for comfort, repositioned, oral care performed per routine.

## 2021-04-07 NOTE — Progress Notes (Signed)
PROGRESS NOTE  Selena Johnson WFU:[id number] DOB: 08-May-1951 DOA: 03/19/2021 PCP: Abner Greenspan, MD   LOS: 4 days   Brief narrative: Patient is a 70 years old female with history of cirrhosis of liver and ascites from nonalcoholic steatohepatitis presented to the hospital with nausea vomiting fatigue and weakness.  She was noted to have spontaneous bacterial peritonitis with gram-negative bacteremia.  Outpatient GI had recently recommended palliative care and hospice but then patient was admitted hospital for decompensated liver cirrhosis.  Initially, patient received vasopressors through peripheral line.  Subsequently goals of care was discussed and plan was to proceed with comfort care.  Patient was then transferred out of the ICU.  Assessment/Plan:  Active Problems:   Septic shock (HCC)  Decompensated cirrhosis of liver with ascites from nonalcoholic steatohepatitis.  Currently on comfort care.  On fentanyl drip as per palliative care.  Spontaneous bacterial peritonitis with gram-negative bacteremia septic shock patient did have a Pasteurella species. Currently on comfort care.    History of breast cancer basal cell cancer.  Anemia of critical illness and chronic disease. On comfort care.  Thrombocytopenia in the setting of cirrhosis of liver and sepsis.  No further work-up planned.  Severe protein calorie calorie malnutrition.  Present on admission.  On comfort care at this time.  Acute metabolic encephalopathy secondary to hepatic encephalopathy and sepsis.  Acute kidney injury-improved.  DVT prophylaxis:   None for comfort   Code Status: DNR  Family Communication:  Not at bedside  Status is: Inpatient  Remains inpatient appropriate because: On comfort care.  Anticipating hospital death  Consultants: PCCM Palliative care  Procedures: None  Anti-infectives:  None   Subjective: Today, patient was seen and examined at bedside.  Appears to be comfortable  moaning when called in  Objective: Vitals:   04/07/21 0500 04/07/21 0600  BP:    Pulse: 65 61  Resp: 10 (!) 9  Temp:    SpO2: 98% 98%    Intake/Output Summary (Last 24 hours) at 04/07/2021 1124 Last data filed at 04/07/2021 0600 Gross per 24 hour  Intake 142.73 ml  Output 60 ml  Net 82.73 ml    Filed Weights   03/12/2021 0938 04/04/21 0430  Weight: 87.4 kg 85.4 kg   Body mass index is 31.33 kg/m.   Physical Exam: GENERAL: Patient is mildly responsive, deconditioned and weak, moaning on verbal command, incomprehensible speech, not in obvious distress. HENT: No scleral pallor or icterus.  Mucosa mildly dry. NECK: is supple, no gross swelling noted. CHEST:  Diminished breath sounds bilaterally. CVS: S1 and S2 heard,   ABDOMEN: Soft, non-tender, bowel sounds are present. EXTREMITIES: No edema. CNS: Moaning, moves extremities SKIN: warm and dry without rashes.  Data Review: I have personally reviewed the following laboratory data and studies,  CBC: Recent Labs  Lab 03/24/2021 0453 04/04/21 0245  WBC 15.4* 13.5*  NEUTROABS 14.3*  --   HGB 12.4 9.9*  HCT 34.1* 28.7*  MCV 103.3* 107.5*  PLT PLATELET CLUMPS NOTED ON SMEAR, COUNT APPEARS DECREASED 84*    Basic Metabolic Panel: Recent Labs  Lab 04/04/2021 0453 03/15/2021 0926 04/04/21 0245 04/04/21 0706  NA 125*  --  127*  --   K 5.7*  --  6.0* 5.6*  CL 93*  --  98  --   CO2 21*  --  17*  --   GLUCOSE 83  --  85  --   BUN 52*  --  61*  --   CREATININE  1.54*  --  1.74*  --   CALCIUM 9.0  --  9.0  --   MG  --  2.3  --   --     Liver Function Tests: Recent Labs  Lab 04/01/2021 0453 04/04/21 0245  AST 66* 71*  ALT 43 28  ALKPHOS 170* 102  BILITOT 7.8* 6.3*  PROT 6.2* 5.6*  ALBUMIN 2.2* 3.3*    Recent Labs  Lab 03/14/2021 0926  LIPASE 48    Recent Labs  Lab 03/29/2021 0926  AMMONIA 83*    Cardiac Enzymes: No results for input(s): CKTOTAL, CKMB, CKMBINDEX, TROPONINI in the last 168 hours. BNP (last 3  results) Recent Labs    10/21/20 1130  BNP 195.0*     ProBNP (last 3 results) No results for input(s): PROBNP in the last 8760 hours.  CBG: Recent Labs  Lab 03/21/2021 1932 04/01/2021 2318 04/04/21 0327 04/04/21 0545 04/04/21 0708  GLUCAP 100* 88 89 140* 90    Recent Results (from the past 240 hour(s))  Resp Panel by RT-PCR (Flu A&B, Covid) Nasopharyngeal Swab     Status: None   Collection Time: 03/11/2021  9:27 AM   Specimen: Nasopharyngeal Swab; Nasopharyngeal(NP) swabs in vial transport medium  Result Value Ref Range Status   SARS Coronavirus 2 by RT PCR NEGATIVE NEGATIVE Final    Comment: (NOTE) SARS-CoV-2 target nucleic acids are NOT DETECTED.  The SARS-CoV-2 RNA is generally detectable in upper respiratory specimens during the acute phase of infection. The lowest concentration of SARS-CoV-2 viral copies this assay can detect is 138 copies/mL. A negative result does not preclude SARS-Cov-2 infection and should not be used as the sole basis for treatment or other patient management decisions. A negative result may occur with  improper specimen collection/handling, submission of specimen other than nasopharyngeal swab, presence of viral mutation(s) within the areas targeted by this assay, and inadequate number of viral copies(<138 copies/mL). A negative result must be combined with clinical observations, patient history, and epidemiological information. The expected result is Negative.  Fact Sheet for Patients:  EntrepreneurPulse.com.au  Fact Sheet for Healthcare Providers:  IncredibleEmployment.be  This test is no t yet approved or cleared by the [place] FDA and  has been authorized for detection and/or diagnosis of SARS-CoV-2 by FDA under an Emergency Use Authorization (EUA). This EUA will remain  in effect (meaning this test can be used) for the duration of the COVID-19 declaration under Section 564(b)(1) of the Act,  21 U.S.C.section 360bbb-3(b)(1), unless the authorization is terminated  or revoked sooner.       Influenza A by PCR NEGATIVE NEGATIVE Final   Influenza B by PCR NEGATIVE NEGATIVE Final    Comment: (NOTE) The Xpert Xpress SARS-CoV-2/FLU/RSV plus assay is intended as an aid in the diagnosis of influenza from Nasopharyngeal swab specimens and should not be used as a sole basis for treatment. Nasal washings and aspirates are unacceptable for Xpert Xpress SARS-CoV-2/FLU/RSV testing.  Fact Sheet for Patients: EntrepreneurPulse.com.au  Fact Sheet for Healthcare Providers: IncredibleEmployment.be  This test is not yet approved or cleared by the [place] FDA and has been authorized for detection and/or diagnosis of SARS-CoV-2 by FDA under an Emergency Use Authorization (EUA). This EUA will remain in effect (meaning this test can be used) for the duration of the COVID-19 declaration under Section 564(b)(1) of the Act, 21 U.S.C. section 360bbb-3(b)(1), unless the authorization is terminated or revoked.  Performed at [facility], [place] [address] [place], [place]  [address]   Blood culture (routine x 2)     Status: Abnormal   Collection Time: 03/13/2021 12:13 PM   Specimen: BLOOD  Result Value Ref Range Status   Specimen Description BLOOD LEFT ANTECUBITAL  Final   Special Requests   Final    BOTTLES DRAWN AEROBIC AND ANAEROBIC Blood Culture adequate volume   Culture  Setup Time   Final    GRAM NEGATIVE RODS AEROBIC BOTTLE ONLY CRITICAL RESULT CALLED TO, READ BACK BY AND VERIFIED WITH: PHARMD J.MILLEN AT 0911 ON 04/04/2021 BY T.SAAD.    Culture (A)  Final    PASTEURELLA SPECIES Usually susceptible to penicillin and other beta lactam agents,quinolones,macrolides and tetracyclines. Performed at [facility], [place] [address] [place], [place] [address]    Report Status 04/06/2021 FINAL  Final  Blood Culture ID Panel (Reflexed)      Status: None   Collection Time: 03/15/2021 12:13 PM  Result Value Ref Range Status   Enterococcus faecalis NOT DETECTED NOT DETECTED Final   Enterococcus Faecium NOT DETECTED NOT DETECTED Final   Listeria monocytogenes NOT DETECTED NOT DETECTED Final   Staphylococcus species NOT DETECTED NOT DETECTED Final   Staphylococcus aureus (BCID) NOT DETECTED NOT DETECTED Final   Staphylococcus epidermidis NOT DETECTED NOT DETECTED Final   Staphylococcus lugdunensis NOT DETECTED NOT DETECTED Final   Streptococcus species NOT DETECTED NOT DETECTED Final   Streptococcus agalactiae NOT DETECTED NOT DETECTED Final   Streptococcus pneumoniae NOT DETECTED NOT DETECTED Final   Streptococcus pyogenes NOT DETECTED NOT DETECTED Final   A.calcoaceticus-baumannii NOT DETECTED NOT DETECTED Final   Bacteroides fragilis NOT DETECTED NOT DETECTED Final   Enterobacterales NOT DETECTED NOT DETECTED Final   Enterobacter cloacae complex NOT DETECTED NOT DETECTED Final   Escherichia coli NOT DETECTED NOT DETECTED Final   Klebsiella aerogenes NOT DETECTED NOT DETECTED Final   Klebsiella oxytoca NOT DETECTED NOT DETECTED Final   Klebsiella pneumoniae NOT DETECTED NOT DETECTED Final   Proteus species NOT DETECTED NOT DETECTED Final   Salmonella species NOT DETECTED NOT DETECTED Final   Serratia marcescens NOT DETECTED NOT DETECTED Final   Haemophilus influenzae NOT DETECTED NOT DETECTED Final   Neisseria meningitidis NOT DETECTED NOT DETECTED Final   Pseudomonas aeruginosa NOT DETECTED NOT DETECTED Final   Stenotrophomonas maltophilia NOT DETECTED NOT DETECTED Final   Candida albicans NOT DETECTED NOT DETECTED Final   Candida auris NOT DETECTED NOT DETECTED Final   Candida glabrata NOT DETECTED NOT DETECTED Final   Candida krusei NOT DETECTED NOT DETECTED Final   Candida parapsilosis NOT DETECTED NOT DETECTED Final   Candida tropicalis NOT DETECTED NOT DETECTED Final   Cryptococcus neoformans/gattii NOT DETECTED NOT  DETECTED Final    Comment: Performed at [facility], [address], [place], [place] [address]  Blood culture (routine x 2)     Status: None (Preliminary result)   Collection Time: 03/26/2021 12:15 PM   Specimen: BLOOD  Result Value Ref Range Status   Specimen Description BLOOD RIGHT ANTECUBITAL  Final   Special Requests   Final    BOTTLES DRAWN AEROBIC AND ANAEROBIC Blood Culture adequate volume   Culture   Final    NO GROWTH 4 DAYS Performed at [facility], [place] [address], [place], [place] [address]    Report Status PENDING  Incomplete  Body fluid culture w Gram Stain     Status: None   Collection Time: 03/15/2021  2:20 PM   Specimen: Peritoneal Washings;  Body Fluid  Result Value Ref Range Status   Specimen Description PERITONEAL  Final   Special Requests NONE  Final   Gram Stain   Final    MODERATE WBC PRESENT,BOTH PMN AND MONONUCLEAR NO ORGANISMS SEEN    Culture   Final    NO GROWTH 3 DAYS Performed at [facility], [address] [place], [place] [address]    Report Status 04/07/2021 FINAL  Final  MRSA Next Gen by PCR, Nasal     Status: None   Collection Time: 03/12/2021  8:25 PM   Specimen: Nasal Mucosa; Nasal Swab  Result Value Ref Range Status   MRSA by PCR Next Gen NOT DETECTED NOT DETECTED Final    Comment: (NOTE) The GeneXpert MRSA Assay (FDA approved for NASAL specimens only), is one component of a comprehensive MRSA colonization surveillance program. It is not intended to diagnose MRSA infection nor to guide or monitor treatment for MRSA infections. Test performance is not FDA approved in patients less than 22 years old. Performed at [facility], [place] [address], [place], [place] [address]       Studies: No results found.    Flora Lipps, MD  [facility] 04/07/2021  If 7PM-7AM, please contact night-coverage

## 2021-04-08 DIAGNOSIS — R0989 Other specified symptoms and signs involving the circulatory and respiratory systems: Secondary | ICD-10-CM

## 2021-04-08 DIAGNOSIS — Z789 Other specified health status: Secondary | ICD-10-CM

## 2021-04-08 LAB — CULTURE, BLOOD (ROUTINE X 2)
Culture: NO GROWTH
Special Requests: ADEQUATE

## 2021-04-08 MED ORDER — LORAZEPAM 2 MG/ML IJ SOLN
2.0000 mg | INTRAMUSCULAR | Status: DC | PRN
  Administered 2021-04-08: 2 mg via INTRAVENOUS
  Filled 2021-04-08: qty 1

## 2021-04-08 MED ORDER — GLYCOPYRROLATE 0.2 MG/ML IJ SOLN: 0.4000 mg | Freq: Once | INTRAMUSCULAR | Status: DC

## 2021-04-08 MED ORDER — FENTANYL BOLUS VIA INFUSION
75.0000 ug | INTRAVENOUS | Status: DC | PRN
Start: 2021-04-08 — End: 2021-04-08
  Filled 2021-04-08: qty 100

## 2021-04-08 MED ORDER — GLYCOPYRROLATE 0.2 MG/ML IJ SOLN
0.4000 mg | Freq: Once | INTRAMUSCULAR | Status: AC
  Administered 2021-04-08: 0.4 mg via INTRAVENOUS
  Filled 2021-04-08: qty 2

## 2021-04-08 MED ORDER — FENTANYL BOLUS VIA INFUSION
50.0000 ug | INTRAVENOUS | Status: DC | PRN
  Filled 2021-04-08: qty 75

## 2021-04-08 MED ORDER — LORAZEPAM 2 MG/ML IJ SOLN: 2.0000 mg | INTRAMUSCULAR | Status: DC | PRN

## 2021-05-04 ENCOUNTER — Other Ambulatory Visit: Payer: Self-pay | Admitting: Nurse Practitioner

## 2021-05-06 NOTE — Progress Notes (Signed)
PROGRESS NOTE  Selena Johnson VZD:[id number] DOB: [birth year] DOA: 03/20/2021 PCP: Abner Greenspan, MD   LOS: 5 days   Brief narrative: Patient is a 70 years old female with history of cirrhosis of liver and ascites from nonalcoholic steatohepatitis presented to the hospital with nausea vomiting fatigue and weakness.  She was noted to have spontaneous bacterial peritonitis with gram-negative bacteremia.  Outpatient GI had recently recommended palliative care and hospice but then patient was admitted hospital for decompensated liver cirrhosis.  Initially, patient received vasopressors through peripheral line.  Subsequently, goals of care was discussed and plan was to proceed with comfort care.  Patient was then transferred out of the ICU.  Assessment/Plan:  Active Problems:   Septic shock (HCC)  Decompensated cirrhosis of liver with ascites from nonalcoholic steatohepatitis.  Currently on comfort care.  On fentanyl drip as per palliative care.  Spontaneous bacterial peritonitis with gram-negative bacteremia septic shock patient did have a Pasteurella species. Currently on comfort care.    History of breast cancer basal cell cancer.  Anemia of critical illness and chronic disease. On comfort care.  Thrombocytopenia in the setting of cirrhosis of liver and sepsis.  No further work-up planned.  Severe protein calorie calorie malnutrition.  Present on admission.  On comfort care at this time.  Acute metabolic encephalopathy secondary to hepatic encephalopathy and sepsis.  Acute kidney injury-improved.  DVT prophylaxis:   None for comfort   Code Status: DNR  Family Communication:  Not at bedside  Status is: Inpatient  Remains inpatient appropriate because: On comfort care.  Anticipating hospital death  Consultants: PCCM Palliative care  Procedures: None  Anti-infectives:  None   Subjective: Today, patient was seen and examined at bedside.  Patient appears to be  comfortable  Objective: Vitals:   Apr 17, 2021 0800 04/17/21 0900  BP:    Pulse: 67 67  Resp: 12 15  Temp:    SpO2: 100% 99%    Intake/Output Summary (Last 24 hours) at Apr 17, 2021 0950 Last data filed at 2021/04/17 0900 Gross per 24 hour  Intake 144.07 ml  Output 25 ml  Net 119.07 ml    Filed Weights   03/29/2021 0938 04/04/21 0430  Weight: 87.4 kg 85.4 kg   Body mass index is 31.33 kg/m.   Physical Exam: GENERAL: Patient is lethargic, appears comfortable, intermittent moaning,  HENT: No scleral pallor or icterus.  Mucosa mildly dry. NECK: is supple, no gross swelling noted. CHEST:  Diminished breath sounds bilaterally. CVS: S1 and S2 heard,   ABDOMEN: Soft, non-tender, bowel sounds are present. EXTREMITIES: No edema. CNS: Lethargic, moaning, SKIN: warm and dry without rashes.  Data Review: I have personally reviewed the following laboratory data and studies,  CBC: Recent Labs  Lab 03/19/2021 0453 04/04/21 0245  WBC 15.4* 13.5*  NEUTROABS 14.3*  --   HGB 12.4 9.9*  HCT 34.1* 28.7*  MCV 103.3* 107.5*  PLT PLATELET CLUMPS NOTED ON SMEAR, COUNT APPEARS DECREASED 84*    Basic Metabolic Panel: Recent Labs  Lab 04/04/2021 0453 04/02/2021 0926 04/04/21 0245 04/04/21 0706  NA 125*  --  127*  --   K 5.7*  --  6.0* 5.6*  CL 93*  --  98  --   CO2 21*  --  17*  --   GLUCOSE 83  --  85  --   BUN 52*  --  61*  --   CREATININE 1.54*  --  1.74*  --   CALCIUM 9.0  --  9.0  --   MG  --  2.3  --   --     Liver Function Tests: Recent Labs  Lab 03/16/2021 0453 04/04/21 0245  AST 66* 71*  ALT 43 28  ALKPHOS 170* 102  BILITOT 7.8* 6.3*  PROT 6.2* 5.6*  ALBUMIN 2.2* 3.3*    Recent Labs  Lab 03/28/2021 0926  LIPASE 48    Recent Labs  Lab 03/07/2021 0926  AMMONIA 83*    Cardiac Enzymes: No results for input(s): CKTOTAL, CKMB, CKMBINDEX, TROPONINI in the last 168 hours. BNP (last 3 results) Recent Labs    10/21/20 1130  BNP 195.0*     ProBNP (last 3  results) No results for input(s): PROBNP in the last 8760 hours.  CBG: Recent Labs  Lab 03/11/2021 1932 03/10/2021 2318 04/04/21 0327 04/04/21 0545 04/04/21 0708  GLUCAP 100* 88 89 140* 90    Recent Results (from the past 240 hour(s))  Resp Panel by RT-PCR (Flu A&B, Covid) Nasopharyngeal Swab     Status: None   Collection Time: 03/30/2021  9:27 AM   Specimen: Nasopharyngeal Swab; Nasopharyngeal(NP) swabs in vial transport medium  Result Value Ref Range Status   SARS Coronavirus 2 by RT PCR NEGATIVE NEGATIVE Final    Comment: (NOTE) SARS-CoV-2 target nucleic acids are NOT DETECTED.  The SARS-CoV-2 RNA is generally detectable in upper respiratory specimens during the acute phase of infection. The lowest concentration of SARS-CoV-2 viral copies this assay can detect is 138 copies/mL. A negative result does not preclude SARS-Cov-2 infection and should not be used as the sole basis for treatment or other patient management decisions. A negative result may occur with  improper specimen collection/handling, submission of specimen other than nasopharyngeal swab, presence of viral mutation(s) within the areas targeted by this assay, and inadequate number of viral copies(<138 copies/mL). A negative result must be combined with clinical observations, patient history, and epidemiological information. The expected result is Negative.  Fact Sheet for Patients:  EntrepreneurPulse.com.au  Fact Sheet for Healthcare Providers:  IncredibleEmployment.be  This test is no t yet approved or cleared by the [place] FDA and  has been authorized for detection and/or diagnosis of SARS-CoV-2 by FDA under an Emergency Use Authorization (EUA). This EUA will remain  in effect (meaning this test can be used) for the duration of the COVID-19 declaration under Section 564(b)(1) of the Act, 21 U.S.C.section 360bbb-3(b)(1), unless the authorization is terminated  or  revoked sooner.       Influenza A by PCR NEGATIVE NEGATIVE Final   Influenza B by PCR NEGATIVE NEGATIVE Final    Comment: (NOTE) The Xpert Xpress SARS-CoV-2/FLU/RSV plus assay is intended as an aid in the diagnosis of influenza from Nasopharyngeal swab specimens and should not be used as a sole basis for treatment. Nasal washings and aspirates are unacceptable for Xpert Xpress SARS-CoV-2/FLU/RSV testing.  Fact Sheet for Patients: EntrepreneurPulse.com.au  Fact Sheet for Healthcare Providers: IncredibleEmployment.be  This test is not yet approved or cleared by the [place] FDA and has been authorized for detection and/or diagnosis of SARS-CoV-2 by FDA under an Emergency Use Authorization (EUA). This EUA will remain in effect (meaning this test can be used) for the duration of the COVID-19 declaration under Section 564(b)(1) of the Act, 21 U.S.C. section 360bbb-3(b)(1), unless the authorization is terminated or revoked.  Performed at [facility], [place] [address], [place], [place] [address]   Blood culture (routine x 2)     Status: Abnormal  Collection Time: 04/02/2021 12:13 PM   Specimen: BLOOD  Result Value Ref Range Status   Specimen Description BLOOD LEFT ANTECUBITAL  Final   Special Requests   Final    BOTTLES DRAWN AEROBIC AND ANAEROBIC Blood Culture adequate volume   Culture  Setup Time   Final    GRAM NEGATIVE RODS AEROBIC BOTTLE ONLY CRITICAL RESULT CALLED TO, READ BACK BY AND VERIFIED WITH: PHARMD J.MILLEN AT 0911 ON 04/04/2021 BY T.SAAD.    Culture (A)  Final    PASTEURELLA SPECIES Usually susceptible to penicillin and other beta lactam agents,quinolones,macrolides and tetracyclines. Performed at [facility], [place] [address], [place], [place] [address]    Report Status 04/06/2021 FINAL  Final  Blood Culture ID Panel (Reflexed)     Status: None   Collection Time: 03/23/2021 12:13 PM  Result Value Ref Range  Status   Enterococcus faecalis NOT DETECTED NOT DETECTED Final   Enterococcus Faecium NOT DETECTED NOT DETECTED Final   Listeria monocytogenes NOT DETECTED NOT DETECTED Final   Staphylococcus species NOT DETECTED NOT DETECTED Final   Staphylococcus aureus (BCID) NOT DETECTED NOT DETECTED Final   Staphylococcus epidermidis NOT DETECTED NOT DETECTED Final   Staphylococcus lugdunensis NOT DETECTED NOT DETECTED Final   Streptococcus species NOT DETECTED NOT DETECTED Final   Streptococcus agalactiae NOT DETECTED NOT DETECTED Final   Streptococcus pneumoniae NOT DETECTED NOT DETECTED Final   Streptococcus pyogenes NOT DETECTED NOT DETECTED Final   A.calcoaceticus-baumannii NOT DETECTED NOT DETECTED Final   Bacteroides fragilis NOT DETECTED NOT DETECTED Final   Enterobacterales NOT DETECTED NOT DETECTED Final   Enterobacter cloacae complex NOT DETECTED NOT DETECTED Final   Escherichia coli NOT DETECTED NOT DETECTED Final   Klebsiella aerogenes NOT DETECTED NOT DETECTED Final   Klebsiella oxytoca NOT DETECTED NOT DETECTED Final   Klebsiella pneumoniae NOT DETECTED NOT DETECTED Final   Proteus species NOT DETECTED NOT DETECTED Final   Salmonella species NOT DETECTED NOT DETECTED Final   Serratia marcescens NOT DETECTED NOT DETECTED Final   Haemophilus influenzae NOT DETECTED NOT DETECTED Final   Neisseria meningitidis NOT DETECTED NOT DETECTED Final   Pseudomonas aeruginosa NOT DETECTED NOT DETECTED Final   Stenotrophomonas maltophilia NOT DETECTED NOT DETECTED Final   Candida albicans NOT DETECTED NOT DETECTED Final   Candida auris NOT DETECTED NOT DETECTED Final   Candida glabrata NOT DETECTED NOT DETECTED Final   Candida krusei NOT DETECTED NOT DETECTED Final   Candida parapsilosis NOT DETECTED NOT DETECTED Final   Candida tropicalis NOT DETECTED NOT DETECTED Final   Cryptococcus neoformans/gattii NOT DETECTED NOT DETECTED Final    Comment: Performed at [facility], [address] [place], [place] [address]  Blood culture (routine x 2)     Status: None   Collection Time: 03/13/2021 12:15 PM   Specimen: BLOOD  Result Value Ref Range Status   Specimen Description BLOOD RIGHT ANTECUBITAL  Final   Special Requests   Final    BOTTLES DRAWN AEROBIC AND ANAEROBIC Blood Culture adequate volume   Culture   Final    NO GROWTH 5 DAYS Performed at [facility], [place] [address] [place], [place] [address]    Report Status 04/21/2021 FINAL  Final  Body fluid culture w Gram Stain     Status: None   Collection Time: 04/02/2021  2:20 PM   Specimen: Peritoneal Washings; Body Fluid  Result Value Ref Range Status   Specimen Description PERITONEAL  Final  Special Requests NONE  Final   Gram Stain   Final    MODERATE WBC PRESENT,BOTH PMN AND MONONUCLEAR NO ORGANISMS SEEN    Culture   Final    NO GROWTH 3 DAYS Performed at [facility], [address], [place], [place] [address]    Report Status 04/07/2021 FINAL  Final  MRSA Next Gen by PCR, Nasal     Status: None   Collection Time: 03/28/2021  8:25 PM   Specimen: Nasal Mucosa; Nasal Swab  Result Value Ref Range Status   MRSA by PCR Next Gen NOT DETECTED NOT DETECTED Final    Comment: (NOTE) The GeneXpert MRSA Assay (FDA approved for NASAL specimens only), is one component of a comprehensive MRSA colonization surveillance program. It is not intended to diagnose MRSA infection nor to guide or monitor treatment for MRSA infections. Test performance is not FDA approved in patients less than 56 years old. Performed at [facility], [place] [address], [place], [place] [address]      Studies: No results found.   Flora Lipps, MD  [facility] 04-25-2021  If 7PM-7AM, please contact night-coverage

## 2021-05-06 NOTE — Progress Notes (Signed)
Attempted to call report again and no update on bed status. Modena Morrow E, RN 04/17/2021 1:07 PM

## 2021-05-06 NOTE — Progress Notes (Signed)
Called to get update on bed and the bed is in process of being cleaned. April 26, 2021 11:46 AM Bobby Rumpf, Kaelynn Igo E, RN

## 2021-05-06 NOTE — Progress Notes (Signed)
Patient arrived to [facility] room 21. Patient responsive to pain. Bed in lowest position. Call light in reach.

## 2021-05-06 NOTE — Death Summary Note (Signed)
DEATH SUMMARY   Patient Details  Name: Selena Johnson MRN: [id number] DOB: Feb 18, 1951  Admission/Discharge Information   Admit Date:  Apr 16, 2021  Date of Death: Date of Death: 04-21-21  Time of Death: Time of Death: 08-13-1613  Length of Stay: 5  Referring Physician: Tower, [name], MD   Reason(s) for Hospitalization  encephalopathy  Diagnoses  Preliminary cause of death: sepsis  Secondary Diagnoses (including complications and co-morbidities):  Active Problems:   Septic shock [facility])   Brief Hospital Course (including significant findings, care, treatment, and services provided and events leading to death)  Patient was a 70 years old female with history of cirrhosis of liver and ascites from nonalcoholic steatohepatitis presented to the hospital with nausea, vomiting fatigue and weakness.  She was noted to have spontaneous bacterial peritonitis with gram-negative bacteremia.  Outpatient GI had recently recommended palliative care and hospice but then patient was admitted hospital for decompensated liver cirrhosis.  Initially, patient received vasopressors through peripheral line.  Subsequently, goals of care was discussed and plan was to proceed with comfort care.  Patient was then transferred out of the ICU.  Following conditions were addressed during hospitalization,  Decompensated cirrhosis of liver with ascites from nonalcoholic steatohepatitis.  GI had seen the patient as outpatient.  Had recommended hospice level of care.  During hospitalization patient was seen by palliative care.  Was put on on fentanyl drip for comfort.  Spontaneous bacterial peritonitis with gram-negative bacteremia septic shock patient did have a Pasteurella species.  Patient was subsequently transitioned to comfort care.   History of breast cancer, basal cell cancer.   Anemia of critical illness and chronic disease.   Thrombocytopenia in the setting of cirrhosis of liver and sepsis.     Severe protein  calorie calorie malnutrition.  Present on admission.     Acute metabolic encephalopathy secondary to hepatic encephalopathy and sepsis.   Acute kidney injury-presentation.    Pertinent Labs and Studies  Significant Diagnostic Studies DG Chest Port 1 View  Result Date: 04/16/2021 CLINICAL DATA:  Chest pain EXAM: PORTABLE CHEST 1 VIEW COMPARISON:  10/21/2020 FINDINGS: There is poor inspiration. Transverse diameter of heart is increased. There are no signs of alveolar pulmonary edema. There are new patchy linear densities in left lower lung fields. There is no significant pleural effusion or pneumothorax. IMPRESSION: Poor inspiration. New patchy linear densities seen in the left lower lung fields suggesting subsegmental atelectasis/pneumonitis. There is no pleural effusion or pneumothorax. Reading location: [place], [place]. Electronically Signed   By: Elmer Picker M.D.   On: 04/16/21 10:19    Microbiology Recent Results (from the past 240 hour(s))  Resp Panel by RT-PCR (Flu A&B, Covid) Nasopharyngeal Swab     Status: None   Collection Time: 04/16/2021  9:27 AM   Specimen: Nasopharyngeal Swab; Nasopharyngeal(NP) swabs in vial transport medium  Result Value Ref Range Status   SARS Coronavirus 2 by RT PCR NEGATIVE NEGATIVE Final    Comment: (NOTE) SARS-CoV-2 target nucleic acids are NOT DETECTED.  The SARS-CoV-2 RNA is generally detectable in upper respiratory specimens during the acute phase of infection. The lowest concentration of SARS-CoV-2 viral copies this assay can detect is 138 copies/mL. A negative result does not preclude SARS-Cov-2 infection and should not be used as the sole basis for treatment or other patient management decisions. A negative result may occur with  improper specimen collection/handling, submission of specimen other than nasopharyngeal swab, presence of viral mutation(s) within the areas targeted by this assay,  and inadequate number of  viral copies(<138 copies/mL). A negative result must be combined with clinical observations, patient history, and epidemiological information. The expected result is Negative.  Fact Sheet for Patients:  EntrepreneurPulse.com.au  Fact Sheet for Healthcare Providers:  IncredibleEmployment.be  This test is no t yet approved or cleared by the [place] FDA and  has been authorized for detection and/or diagnosis of SARS-CoV-2 by FDA under an Emergency Use Authorization (EUA). This EUA will remain  in effect (meaning this test can be used) for the duration of the COVID-19 declaration under Section 564(b)(1) of the Act, 21 U.S.C.section 360bbb-3(b)(1), unless the authorization is terminated  or revoked sooner.       Influenza A by PCR NEGATIVE NEGATIVE Final   Influenza B by PCR NEGATIVE NEGATIVE Final    Comment: (NOTE) The Xpert Xpress SARS-CoV-2/FLU/RSV plus assay is intended as an aid in the diagnosis of influenza from Nasopharyngeal swab specimens and should not be used as a sole basis for treatment. Nasal washings and aspirates are unacceptable for Xpert Xpress SARS-CoV-2/FLU/RSV testing.  Fact Sheet for Patients: EntrepreneurPulse.com.au  Fact Sheet for Healthcare Providers: IncredibleEmployment.be  This test is not yet approved or cleared by the [place] FDA and has been authorized for detection and/or diagnosis of SARS-CoV-2 by FDA under an Emergency Use Authorization (EUA). This EUA will remain in effect (meaning this test can be used) for the duration of the COVID-19 declaration under Section 564(b)(1) of the Act, 21 U.S.C. section 360bbb-3(b)(1), unless the authorization is terminated or revoked.  Performed at [facility], [place] [address], [place], [place] [address]   Blood culture (routine x 2)     Status: Abnormal   Collection Time: 04/04/2021 12:13 PM   Specimen: BLOOD  Result  Value Ref Range Status   Specimen Description BLOOD LEFT ANTECUBITAL  Final   Special Requests   Final    BOTTLES DRAWN AEROBIC AND ANAEROBIC Blood Culture adequate volume   Culture  Setup Time   Final    GRAM NEGATIVE RODS AEROBIC BOTTLE ONLY CRITICAL RESULT CALLED TO, READ BACK BY AND VERIFIED WITH: PHARMD J.MILLEN AT 0911 ON 04/04/2021 BY T.SAAD.    Culture (A)  Final    PASTEURELLA SPECIES Usually susceptible to penicillin and other beta lactam agents,quinolones,macrolides and tetracyclines. Performed at [facility], [place] [address] [place], [place] [address]    Report Status 04/06/2021 FINAL  Final  Blood Culture ID Panel (Reflexed)     Status: None   Collection Time: 03/09/2021 12:13 PM  Result Value Ref Range Status   Enterococcus faecalis NOT DETECTED NOT DETECTED Final   Enterococcus Faecium NOT DETECTED NOT DETECTED Final   Listeria monocytogenes NOT DETECTED NOT DETECTED Final   Staphylococcus species NOT DETECTED NOT DETECTED Final   Staphylococcus aureus (BCID) NOT DETECTED NOT DETECTED Final   Staphylococcus epidermidis NOT DETECTED NOT DETECTED Final   Staphylococcus lugdunensis NOT DETECTED NOT DETECTED Final   Streptococcus species NOT DETECTED NOT DETECTED Final   Streptococcus agalactiae NOT DETECTED NOT DETECTED Final   Streptococcus pneumoniae NOT DETECTED NOT DETECTED Final   Streptococcus pyogenes NOT DETECTED NOT DETECTED Final   A.calcoaceticus-baumannii NOT DETECTED NOT DETECTED Final   Bacteroides fragilis NOT DETECTED NOT DETECTED Final   Enterobacterales NOT DETECTED NOT DETECTED Final   Enterobacter cloacae complex NOT DETECTED NOT DETECTED Final   Escherichia coli NOT DETECTED NOT DETECTED Final   Klebsiella aerogenes NOT DETECTED NOT DETECTED Final   Klebsiella oxytoca  NOT DETECTED NOT DETECTED Final   Klebsiella pneumoniae NOT DETECTED NOT DETECTED Final   Proteus species NOT DETECTED NOT DETECTED Final   Salmonella species NOT DETECTED NOT  DETECTED Final   Serratia marcescens NOT DETECTED NOT DETECTED Final   Haemophilus influenzae NOT DETECTED NOT DETECTED Final   Neisseria meningitidis NOT DETECTED NOT DETECTED Final   Pseudomonas aeruginosa NOT DETECTED NOT DETECTED Final   Stenotrophomonas maltophilia NOT DETECTED NOT DETECTED Final   Candida albicans NOT DETECTED NOT DETECTED Final   Candida auris NOT DETECTED NOT DETECTED Final   Candida glabrata NOT DETECTED NOT DETECTED Final   Candida krusei NOT DETECTED NOT DETECTED Final   Candida parapsilosis NOT DETECTED NOT DETECTED Final   Candida tropicalis NOT DETECTED NOT DETECTED Final   Cryptococcus neoformans/gattii NOT DETECTED NOT DETECTED Final    Comment: Performed at [facility], [place] [address], [place], [place] [address]  Blood culture (routine x 2)     Status: None   Collection Time: 03/31/2021 12:15 PM   Specimen: BLOOD  Result Value Ref Range Status   Specimen Description BLOOD RIGHT ANTECUBITAL  Final   Special Requests   Final    BOTTLES DRAWN AEROBIC AND ANAEROBIC Blood Culture adequate volume   Culture   Final    NO GROWTH 5 DAYS Performed at [facility], [place] [address] [place], [place] [address]    Report Status 04-19-21 FINAL  Final  Body fluid culture w Gram Stain     Status: None   Collection Time: 03/07/2021  2:20 PM   Specimen: Peritoneal Washings; Body Fluid  Result Value Ref Range Status   Specimen Description PERITONEAL  Final   Special Requests NONE  Final   Gram Stain   Final    MODERATE WBC PRESENT,BOTH PMN AND MONONUCLEAR NO ORGANISMS SEEN    Culture   Final    NO GROWTH 3 DAYS Performed at [facility], [address], [place], [place] [address]    Report Status 04/07/2021 FINAL  Final  MRSA Next Gen by PCR, Nasal     Status: None   Collection Time: 03/23/2021  8:25 PM   Specimen: Nasal Mucosa; Nasal Swab  Result Value Ref Range Status   MRSA by PCR Next Gen NOT DETECTED NOT DETECTED Final    Comment:  (NOTE) The GeneXpert MRSA Assay (FDA approved for NASAL specimens only), is one component of a comprehensive MRSA colonization surveillance program. It is not intended to diagnose MRSA infection nor to guide or monitor treatment for MRSA infections. Test performance is not FDA approved in patients less than 51 years old. Performed at [facility], [place] [address], [place],  [address]     Lab Basic Metabolic Panel: Recent Labs  Lab 04/02/2021 1245 04/04/21 0245 04/04/21 0706  NA  --  127*  --   K  --  6.0* 5.6*  CL  --  98  --   CO2  --  17*  --   GLUCOSE  --  85  --   BUN  --  61*  --   CREATININE  --  1.74*  --   CALCIUM  --  9.0  --   MG 2.3  --   --    Liver Function Tests: Recent Labs  Lab 04/04/21 0245  AST 71*  ALT 28  ALKPHOS 102  BILITOT 6.3*  PROT 5.6*  ALBUMIN 3.3*   Recent Labs  Lab  03/18/2021 0926  LIPASE 48   Recent Labs  Lab 03/21/2021 0926  AMMONIA 83*   CBC: Recent Labs  Lab 04/04/21 0245  WBC 13.5*  HGB 9.9*  HCT 28.7*  MCV 107.5*  PLT 84*   Cardiac Enzymes: No results for input(s): CKTOTAL, CKMB, CKMBINDEX, TROPONINI in the last 168 hours. Sepsis Labs: Recent Labs  Lab 03/21/2021 0926 03/23/2021 1634 04/04/21 0245  WBC  --   --  13.5*  LATICACIDVEN 3.4* 3.3*  --     Procedures/Operations  None   Drue Harr 04/10/2021, 7:19 AM

## 2021-05-06 NOTE — Progress Notes (Signed)
Daily Progress Note   Patient Name: Selena Johnson       Date: 2021-04-22 DOB: 04-20-1951  Age: 70 y.o. MRN#: [id number] Attending Physician: Flora Lipps, MD Primary Care Physician: Tower, [name], MD Admit Date: 03/15/2021  Reason for Consultation/Follow-up: Non pain symptom management, Pain control, Psychosocial/spiritual support, and Terminal Care  Subjective: Chart review performed. Received report from primary RN on 69M - no acute concerns. RN reports patient remains unresponsive.  Went to visit patient at bedside - just transferred to 6N - no family/visitors present. Patient was lying in bed - she does not wake to voice/gentle touch. Fentanyl infusion going at 165mg/hr. No signs or non-verbal gestures of pain or discomfort noted. Gasping with short periods of apnea noted as well as respiratory secretions heard on auscultation.   524m fentanyl bolus provided.   Discussed with 6N primary RN symptom management plan.   Length of Stay: 5  Current Medications: Scheduled Meds:   Chlorhexidine Gluconate Cloth  6 each Topical Daily   mouth rinse  15 mL Mouth Rinse BID    Continuous Infusions:  sodium chloride Stopped (04/06/21 2048)   fentaNYL infusion INTRAVENOUS 150 mcg/hr ([id number])    PRN Meds: acetaminophen **OR** acetaminophen, diphenhydrAMINE, fentaNYL, fentaNYL (SUBLIMAZE) injection, glycopyrrolate **OR** glycopyrrolate **OR** glycopyrrolate, lip balm, LORazepam, polyvinyl alcohol  Physical Exam Vitals and nursing note reviewed.  Constitutional:      General: She is not in acute distress.    Appearance: She is cachectic. She is ill-appearing.  Pulmonary:     Effort: No respiratory distress.     Comments: Apnea, course lung sounds bilaterally  Skin:     General: Skin is warm and dry.  Neurological:     Mental Status: She is unresponsive.     Motor: Weakness present.  Psychiatric:        Speech: She is noncommunicative.            Vital Signs: BP (!) 107/50 (BP Location: Left Arm)   Pulse 67   Temp (!) 94 F (34.4 C) (Axillary)   Resp 14   Wt 85.4 kg   SpO2 99%   BMI 31.33 kg/m  SpO2: SpO2: 99 % O2 Device: O2 Device: Room Air O2 Flow Rate:    Intake/output summary:  Intake/Output Summary (Last 24 hours) at 1111/17/2022  [place] filed at 04-24-2021 1200 Gross per 24 hour  Intake 166.3 ml  Output --  Net 166.3 ml   LBM: Last BM Date:  (PTa) Baseline Weight: Weight: 87.4 kg Most recent weight: Weight: 85.4 kg       Palliative Assessment/Data: PPS 10%      Patient Active Problem List   Diagnosis Date Noted   Septic shock ([place]) 04/04/2021   Venous insufficiency 10/27/2020   Volume overload 10/21/2020   Pedal edema 10/21/2020   Caregiver stress 09/07/2020   Secondary esophageal varices without bleeding ([place]) 09/07/2020   Malignant neoplasm of upper-inner quadrant of left breast in female, estrogen receptor positive ([place]) 06/27/2018   Left breast mass 06/17/2018   Elevated random blood glucose level 07/28/2017   Skin cancer screening 05/05/2017   Colon cancer screening 07/21/2015   Estrogen deficiency 07/21/2015   Calcification of right breast 05/06/2015   Encounter for Medicare annual wellness exam 07/20/2014   Unspecified constipation 10/25/2012   Liver cirrhosis secondary to NASH ([place]) 10/23/2012   Hypokalemia 07/13/2012   Thrombocytopenia ([place]) 11/10/2010   Routine general medical examination at a health care facility 11/04/2010   Hyperlipidemia 03/18/2008   Obesity 03/18/2008   TRANSAMINASES, SERUM, ELEVATED 03/18/2008   MENOPAUSE-RELATED VASOMOTOR SYMPTOMS, HOT FLASHES 10/23/2006   Hypothyroidism 10/11/2006   GLAUCOMA 10/11/2006   Essential hypertension 10/11/2006   GERD 10/11/2006   History of  kidney stones 10/11/2006    Palliative Care Assessment & Plan   Patient Profile: 70 y.o. female  with past medical history of decompensated hepatic cirrhosis secondary to NASH, ascites, breast cancer, basal cell carcinoma, HTN, and HLD who presented to the emergency department on 03/19/2021 with complaints of nausea, fatigue, and generalized weakness. On admission to the ED, patient was hypotensive and bradycardic. She was admitted to PCCM with septic shock due to presumed SBP.     Assessment: Septic shock secondary to SBP with gram negative rod bacteremia Cirrhosis with ascites secondary to NASH AKI secondary to sepsis End of life care  Recommendations/Plan: Continue full comfort measures Continue DNR/DNI as previously documented Patient is not stable for transfer to hospice facility - anticipate hospital death Robinul 0.63m IV as well as ativan 274mIV now for respiratory secretions and gasping Adjusted ativan order to: 33m63mV q1hr PRN distress, anxiety, increased work of breathing. Can consider scheduling ativan if needed Increased fentanyl bolus dose to [id number] Continue foley catheter for EOL care Nursing to provide frequent assessments and administer PRN medications as clinically necessary to ensure EOL comfort PMT will continue to follow and support holistically   Goals of Care and Additional Recommendations: Limitations on Scope of Treatment: Full Comfort Care  Code Status:    Code Status Orders  (From admission, onward)           Start     Ordered   04/06/21 1956  Do not attempt resuscitation (DNR)  Continuous       Question Answer Comment  In the event of cardiac or respiratory ARREST Do not call a "code blue"   In the event of cardiac or respiratory ARREST Do not perform Intubation, CPR, defibrillation or ACLS   In the event of cardiac or respiratory ARREST Use medication by any route, position, wound care, and other measures to relive pain and  suffering. May use oxygen, suction and manual treatment of airway obstruction as needed for comfort.      04/06/21 1958  Code Status History     Date Active Date Inactive Code Status Order ID Comments User Context   04/04/2021 1118 04/06/2021 1958 DNR [id number]  Chesley Mires, MD Inpatient   03/30/2021 1431 04/04/2021 1118 DNR [id number]  Lanier Clam, MD ED   10/21/2020 1750 10/24/2020 0023 DNR [id number]  Donne Hazel, MD ED   10/21/2020 1742 10/21/2020 1749 Full Code [id number]  Donne Hazel, MD ED       Prognosis:  Hours - Days  Discharge Planning: Anticipated Hospital Death  Care plan was discussed with primary RN  Thank you for allowing the Palliative Medicine Team to assist in the care of this patient.   Total Time 30 minutes Prolonged Time Billed  no       Greater than 50%  of this time was spent counseling and coordinating care related to the above assessment and plan.  Lin Landsman, NP  Please contact Palliative Medicine Team phone at [phone] for questions and concerns.

## 2021-05-06 NOTE — Progress Notes (Signed)
Nutrition Brief Note  Chart reviewed. Pt has transitioned to comfort care.  No nutrition interventions planned at this time.  Please consult as needed.    Gustavus Bryant, MS, RD, LDN Inpatient Clinical Dietitian Please see AMiON for contact information.

## 2021-05-06 DEATH — deceased

## 2021-05-18 ENCOUNTER — Ambulatory Visit: Payer: Medicare Other | Admitting: Internal Medicine

## 2021-06-13 ENCOUNTER — Other Ambulatory Visit: Payer: Self-pay | Admitting: Family Medicine

## 2021-08-26 ENCOUNTER — Ambulatory Visit: Payer: Medicare Other

## 2023-02-17 IMAGING — CR DG CHEST 2V
2 series · 2 of 2 positions shown · non-contrast
Comparison: Chest radiograph 12/23/2004.

CLINICAL DATA: 70-year-old female with increasing lower extremity
swelling, numbness, shortness of breath and wheezing. Former smoker.

EXAM:
CHEST - 2 VIEW

[chest pa]
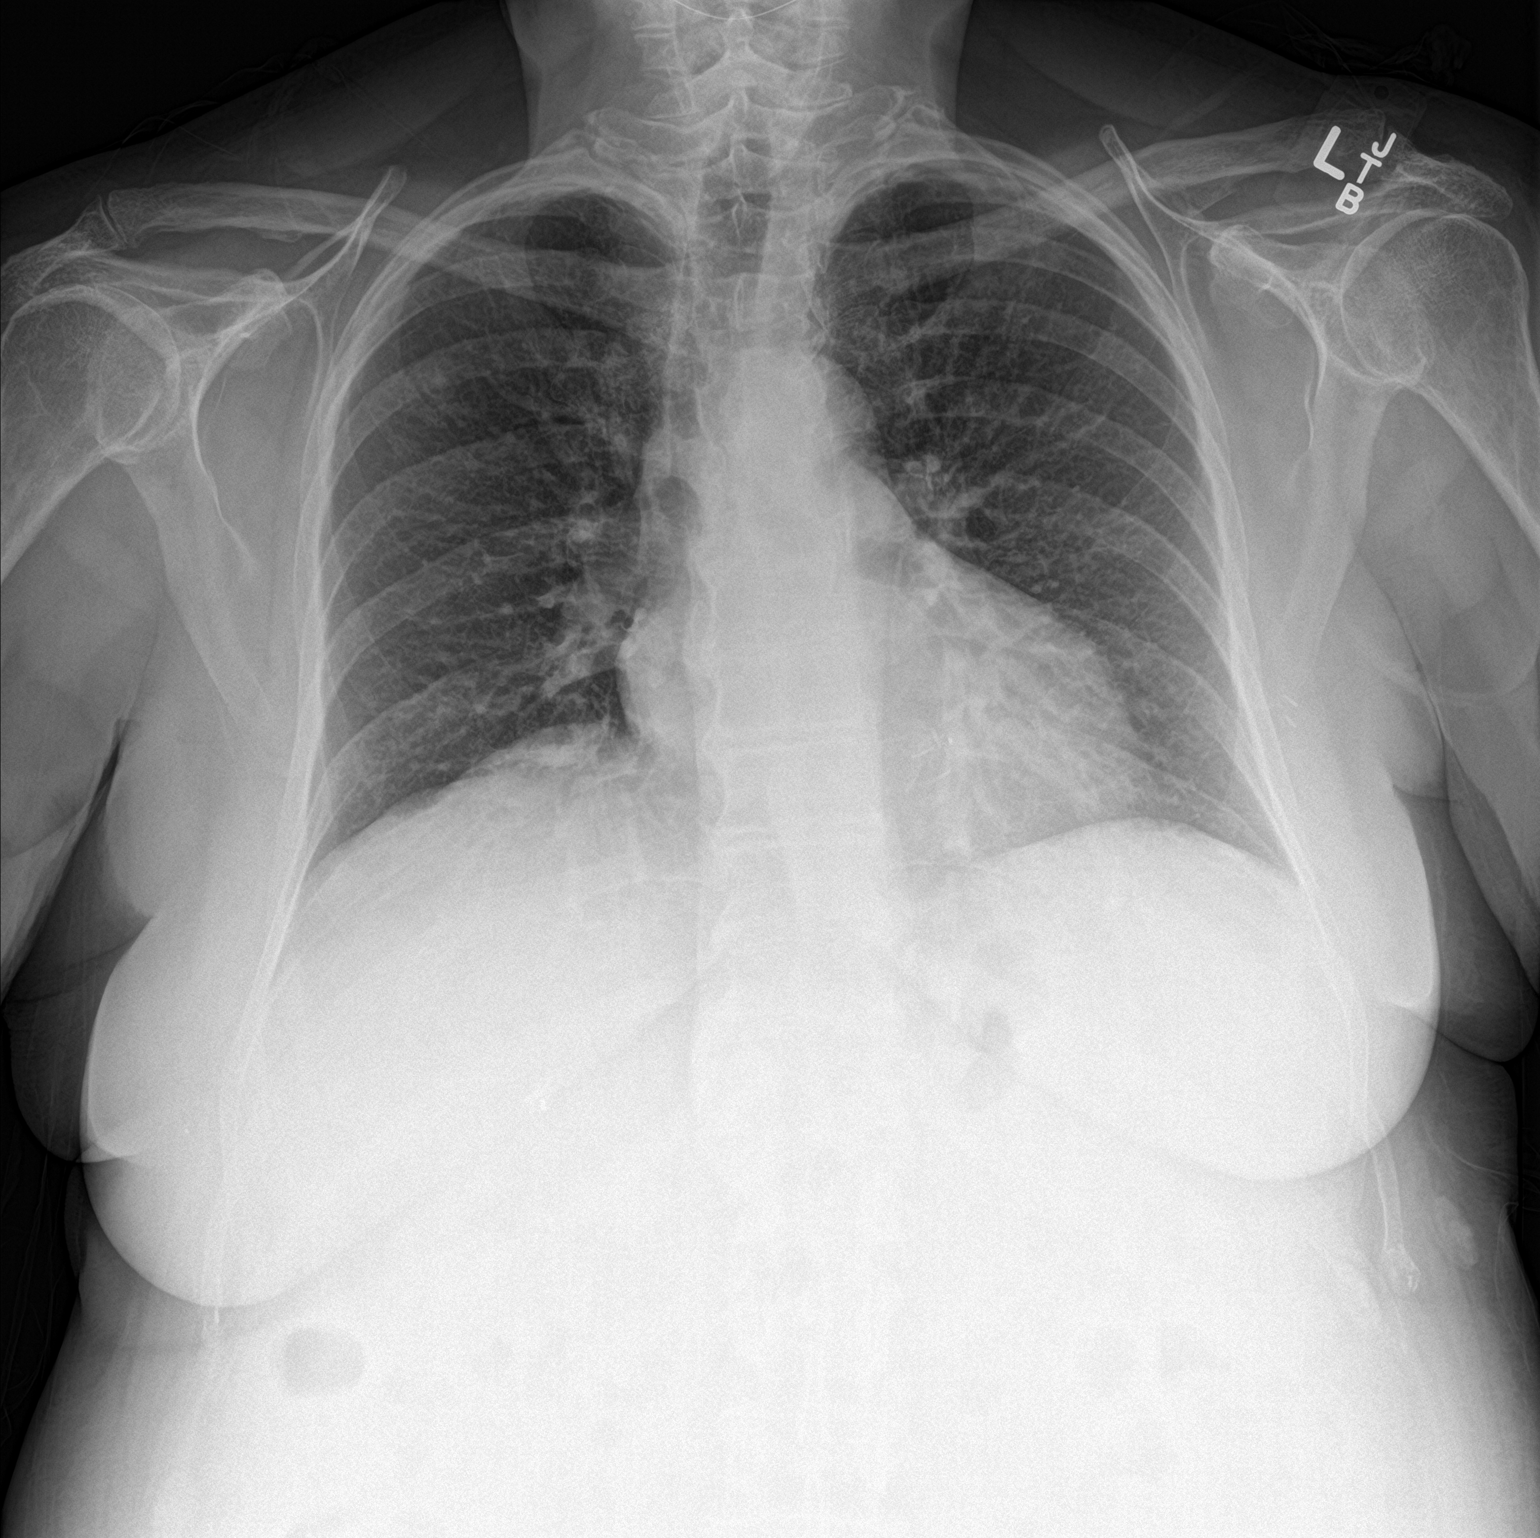

[chest lat]
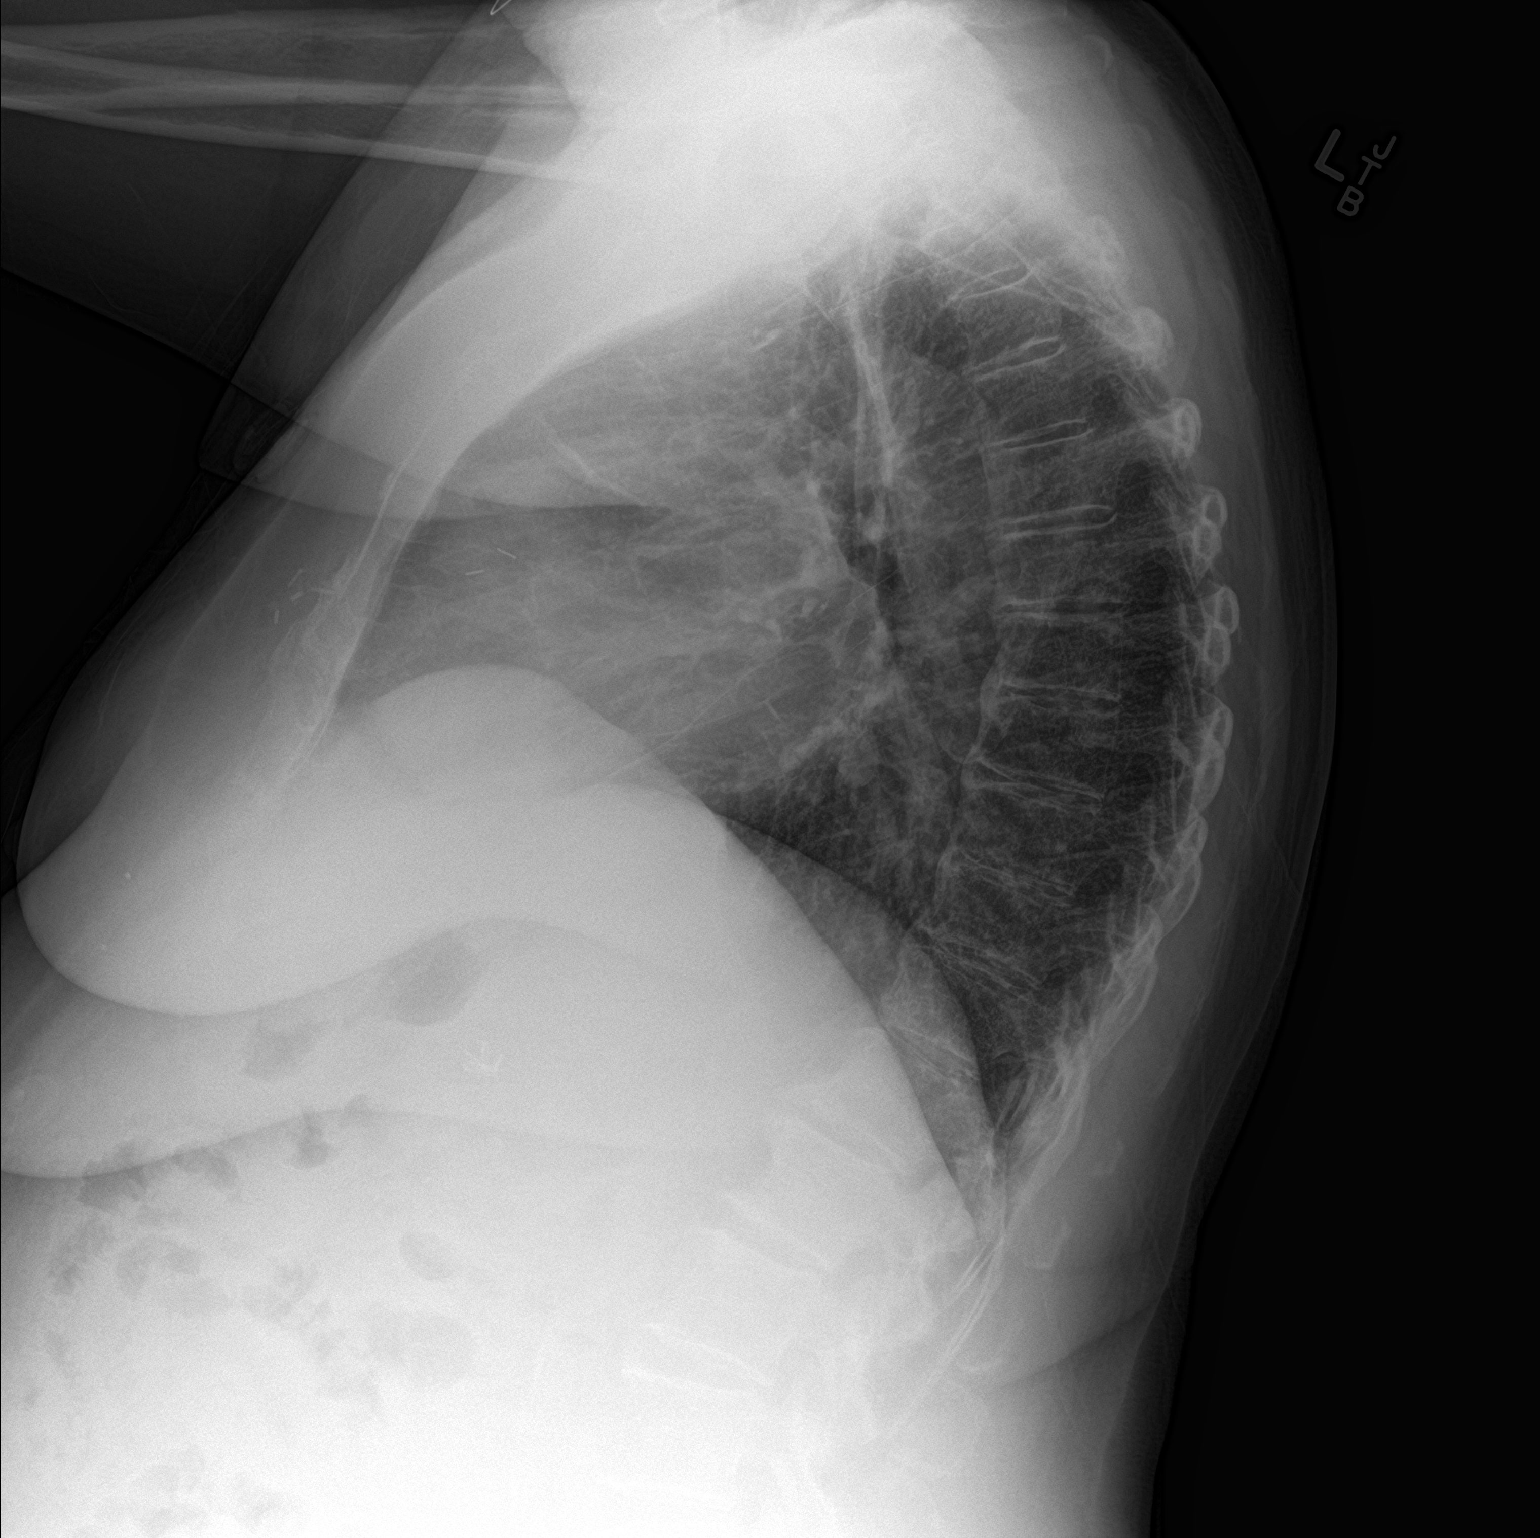

[2 of 2 positions shown; findings below may reference images not displayed]

FINDINGS: Lung volumes and mediastinal contours remain normal. Visualized
tracheal air column is within normal limits. No pneumothorax,
pleural effusion, pulmonary edema or confluent pulmonary opacity.
Mild diffuse interstitial markings have increased since 7002,
probably smoking related.

No acute osseous abnormality identified. Cholecystectomy clips in
the upper abdomen. Negative visible bowel gas pattern.
IMPRESSION: No acute cardiopulmonary abnormality.

## 2023-04-07 IMAGING — US IR PARACENTESIS
1 series · 2 of 2 positions shown · non-contrast
Comparison: none

INDICATION: Ascites secondary to NASH. Request for diagnostic and therapeutic
paracentesis.

[Series 1: ir (id) (id)/(id)/(id) ir · 2 of 2 slices shown]
[im 1/2]
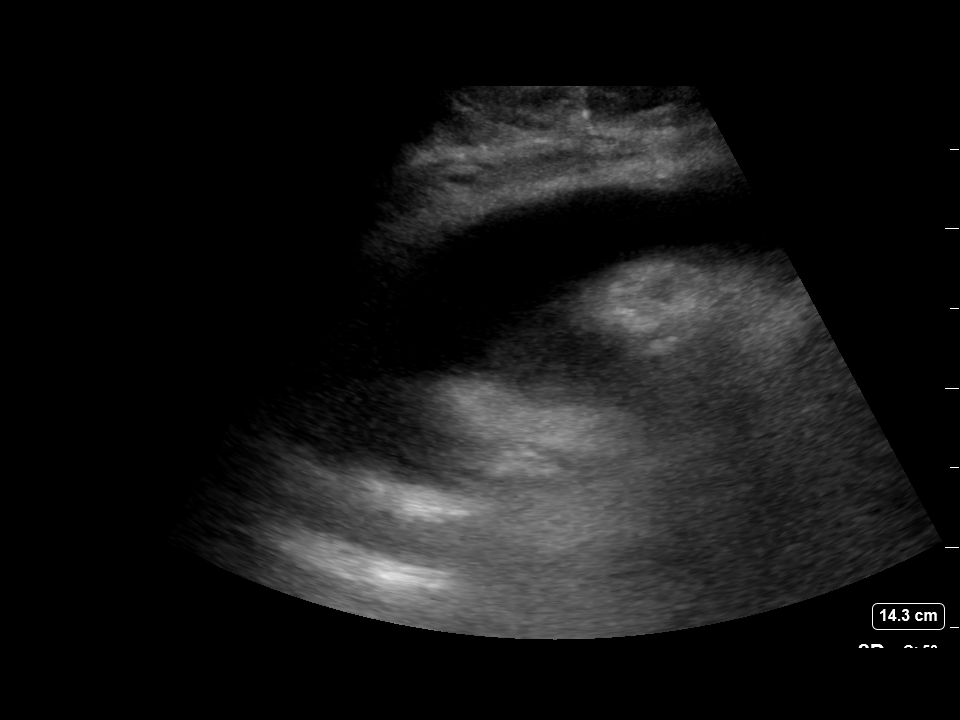
[im 2/2]
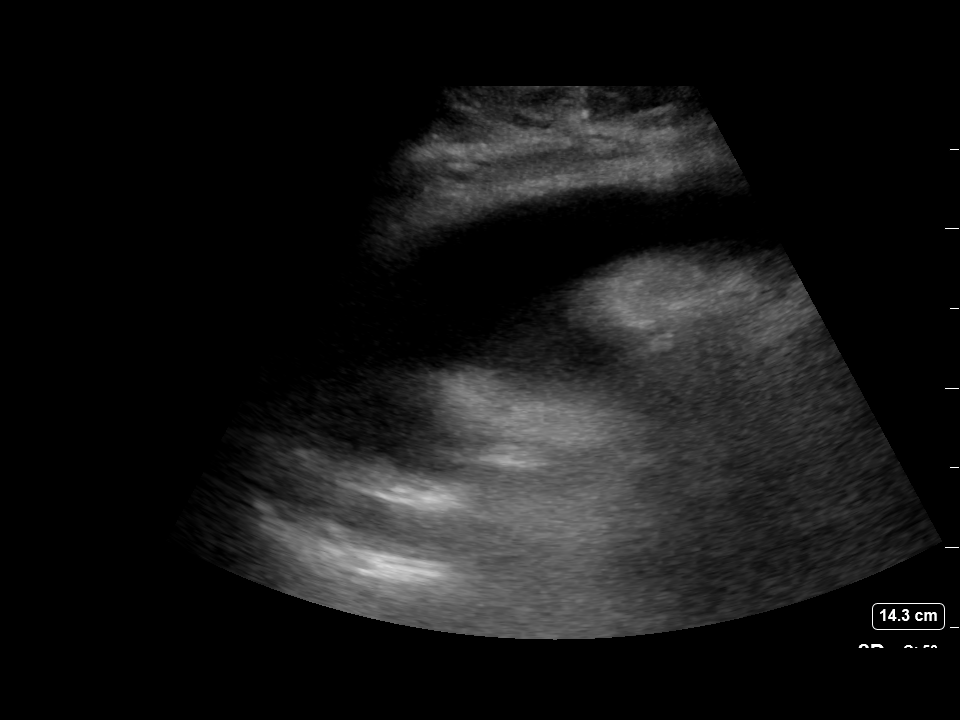

[2 of 2 positions shown; findings below may reference images not displayed]

EXAM:
ULTRASOUND GUIDED PARACENTESIS

MEDICATIONS:
1% lidocaine 20 mL

COMPLICATIONS:
None immediate.

PROCEDURE:
Informed written consent was obtained from the patient after a
discussion of the risks, benefits and alternatives to treatment. A
timeout was performed prior to the initiation of the procedure.

Initial ultrasound scanning demonstrates a large amount of ascites
within the right lower abdominal quadrant. The right lower abdomen
was prepped and draped in the usual sterile fashion. 1% lidocaine
was used for local anesthesia.

Following this, a 19 gauge, 7-cm, Yueh catheter was introduced. An
ultrasound image was saved for documentation purposes. The
paracentesis was performed. The catheter was removed and a dressing
was applied. The patient tolerated the procedure well without
immediate post procedural complication.
FINDINGS: A total of approximately 2.6 L of hazy yellow fluid was removed.
Samples were sent to the laboratory as requested by the clinical
team.
IMPRESSION: Successful ultrasound-guided paracentesis yielding 2.6 liters of
peritoneal fluid.

## 2023-07-31 IMAGING — DX DG CHEST 1V PORT
1 series · 1 of 1 positions shown · non-contrast
Comparison: 10/21/2020

CLINICAL DATA: Chest pain

EXAM:
PORTABLE CHEST 1 VIEW

[chest ap]
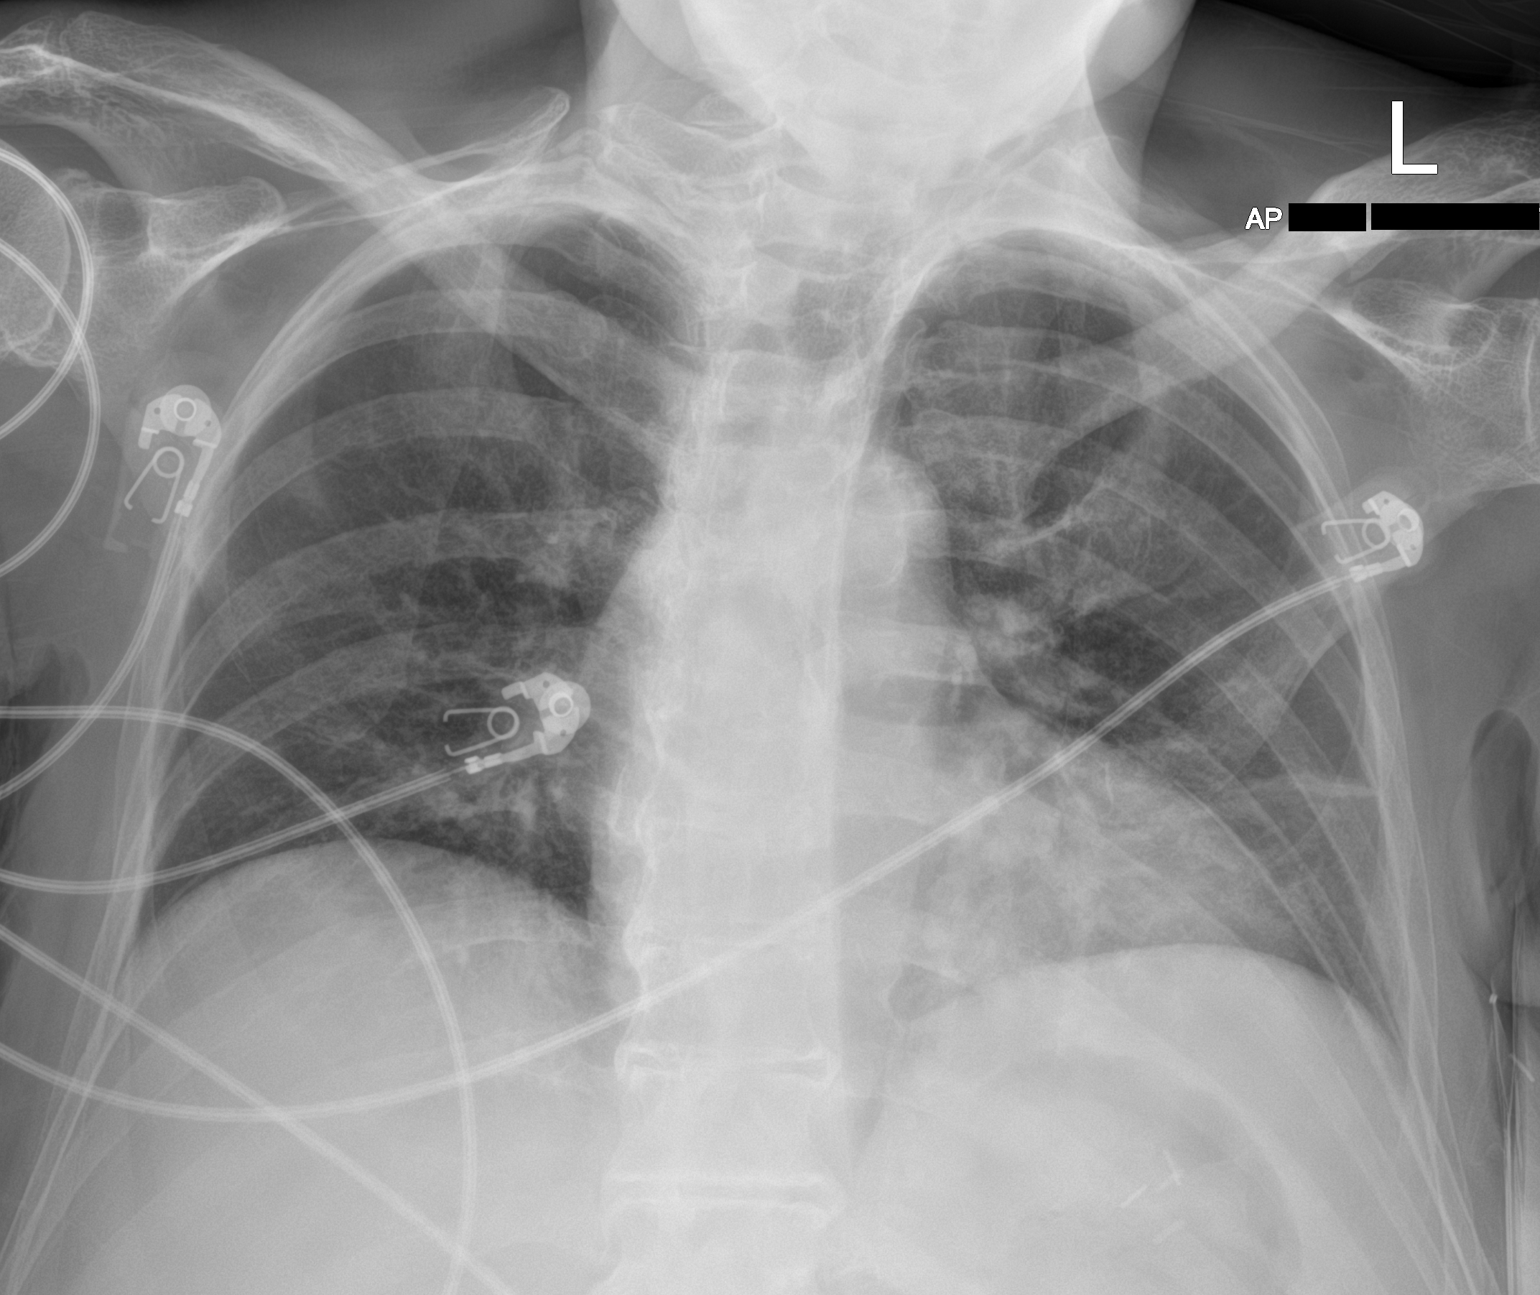

[1 of 1 positions shown; findings below may reference images not displayed]

FINDINGS: There is poor inspiration. Transverse diameter of heart is
increased. There are no signs of alveolar pulmonary edema. There are
new patchy linear densities in left lower lung fields. There is no
significant pleural effusion or pneumothorax.
IMPRESSION: Poor inspiration. New patchy linear densities seen in the left lower
lung fields suggesting subsegmental atelectasis/pneumonitis. There
is no pleural effusion or pneumothorax.

Reading location: [place], [place].
